# Patient Record
Sex: Male | Born: 1950
Health system: Southern US, Community
[De-identification: ages and names within clinical notes are randomized; demographics above are authoritative.]

## PROBLEM LIST (undated history)

## (undated) DIAGNOSIS — H409 Unspecified glaucoma: Secondary | ICD-10-CM

## (undated) DIAGNOSIS — G4730 Sleep apnea, unspecified: Secondary | ICD-10-CM

## (undated) DIAGNOSIS — I471 Supraventricular tachycardia, unspecified: Secondary | ICD-10-CM

## (undated) DIAGNOSIS — C4491 Basal cell carcinoma of skin, unspecified: Secondary | ICD-10-CM

## (undated) DIAGNOSIS — Z87898 Personal history of other specified conditions: Secondary | ICD-10-CM

## (undated) DIAGNOSIS — K5900 Constipation, unspecified: Secondary | ICD-10-CM

## (undated) DIAGNOSIS — Z9289 Personal history of other medical treatment: Secondary | ICD-10-CM

## (undated) DIAGNOSIS — T7840XA Allergy, unspecified, initial encounter: Secondary | ICD-10-CM

## (undated) DIAGNOSIS — R569 Unspecified convulsions: Secondary | ICD-10-CM

## (undated) DIAGNOSIS — C61 Malignant neoplasm of prostate: Secondary | ICD-10-CM

## (undated) DIAGNOSIS — E785 Hyperlipidemia, unspecified: Secondary | ICD-10-CM

## (undated) DIAGNOSIS — R251 Tremor, unspecified: Secondary | ICD-10-CM

## (undated) HISTORY — DX: Supraventricular tachycardia: I47.1

## (undated) HISTORY — PX: CARDIAC CATHETERIZATION: SHX172

## (undated) HISTORY — DX: Personal history of other specified conditions: Z87.898

## (undated) HISTORY — PX: CATARACT EXTRACTION, BILATERAL: SHX1313

## (undated) HISTORY — DX: Allergy, unspecified, initial encounter: T78.40XA

## (undated) HISTORY — DX: Hyperlipidemia, unspecified: E78.5

## (undated) HISTORY — DX: Supraventricular tachycardia, unspecified: I47.10

## (undated) HISTORY — DX: Unspecified convulsions: R56.9

## (undated) HISTORY — DX: Basal cell carcinoma of skin, unspecified: C44.91

## (undated) HISTORY — DX: Sleep apnea, unspecified: G47.30

## (undated) HISTORY — PX: COLONOSCOPY: SHX174

## (undated) HISTORY — DX: Constipation, unspecified: K59.00

## (undated) HISTORY — PX: CORONARY ANGIOPLASTY: SHX604

---

## 2006-09-26 ENCOUNTER — Encounter: Admission: RE | Admit: 2006-09-26 | Discharge: 2006-12-07 | Payer: Self-pay | Admitting: Sports Medicine

## 2006-10-19 ENCOUNTER — Encounter: Admission: RE | Admit: 2006-10-19 | Discharge: 2006-10-19 | Payer: Self-pay | Admitting: Neurosurgery

## 2008-02-09 ENCOUNTER — Encounter: Admission: RE | Admit: 2008-02-09 | Discharge: 2008-02-09 | Payer: Self-pay | Admitting: Neurosurgery

## 2009-01-22 ENCOUNTER — Encounter: Admission: RE | Admit: 2009-01-22 | Discharge: 2009-01-22 | Payer: Self-pay | Admitting: Neurosurgery

## 2009-06-02 ENCOUNTER — Encounter (INDEPENDENT_AMBULATORY_CARE_PROVIDER_SITE_OTHER): Payer: Self-pay | Admitting: *Deleted

## 2009-06-04 ENCOUNTER — Ambulatory Visit: Payer: Self-pay | Admitting: Gastroenterology

## 2009-06-14 ENCOUNTER — Ambulatory Visit: Payer: Self-pay | Admitting: Gastroenterology

## 2009-11-24 DIAGNOSIS — E782 Mixed hyperlipidemia: Secondary | ICD-10-CM

## 2009-11-24 HISTORY — DX: Mixed hyperlipidemia: E78.2

## 2010-06-28 NOTE — Procedures (Signed)
Summary: Colonoscopy  Patient: [name] Note: All result statuses are Final unless otherwise noted.  Tests: (1) Colonoscopy (COL)   COL Colonoscopy           DONE     [facility]     [address]     [place], [place]  [address]           COLONOSCOPY PROCEDURE REPORT           PATIENT:  Hunter Vega, Hunter Vega  MR#:  [id number]     BIRTHDATE:  [birth year], 58 yrs. old  GENDER:  male           ENDOSCOPIST:  Rachael Fee, MD     Referred by:  Tracey Harries, M.D.           PROCEDURE DATE:  06/14/2009     PROCEDURE:  Colonoscopy, Diagnostic     ASA CLASS:  Class II     INDICATIONS:  Routine Risk Screening           MEDICATIONS:   Fentanyl 50 mcg IV, Versed 6 mg IV           DESCRIPTION OF PROCEDURE:   After the risks benefits and     alternatives of the procedure were thoroughly explained, informed     consent was obtained.  Digital rectal exam was performed and     revealed no rectal masses.   The LB CF-[id number] endoscope     was introduced through the anus and advanced to the cecum, which     was identified by both the appendix and ileocecal valve, without     limitations.  The quality of the prep was excellent, using     MoviPrep.  The instrument was then slowly withdrawn as the colon     was fully examined.     <<PROCEDUREIMAGES>>           FINDINGS:  External hemorrhoids were found. These were small and     not thrombosed.  Mild diverticulosis was found in the sigmoid     colon (see image4).  This was otherwise a normal examination of     the colon (see image2, image3, and image5).   Retroflexed views in     the rectum revealed no abnormalities.    The scope was then     withdrawn from the patient and the procedure completed.           COMPLICATIONS:  None           ENDOSCOPIC IMPRESSION:     1) External hemorrhoids, small and not thrombosed     2) Mild diverticulosis in the sigmoid colon     3) Otherwise normal examination            RECOMMENDATIONS:     1) Continue current colorectal screening recommendations for     "routine risk" patients with a repeat colonoscopy in 10 years.           REPEAT EXAM:  10 years           ______________________________     Rachael Fee, MD           n.     eSIGNED:   Rachael Fee at 06/14/2009 11:47 AM           Gregor Hams, [id number]  Note: An exclamation mark (!) indicates a result that was not dispersed into the flowsheet. Document Creation Date: 06/14/2009 11:46 AM  _______________________________________________________________________  (1) Order result status: Final Collection or observation date-time: 06/14/2009 11:40 Requested date-time:  Receipt date-time:  Reported date-time:  Referring Physician:   Ordering Physician: Rob Bunting [id number]) Specimen Source:  Source: Launa Grill Order Number: [id number] Lab site:   Appended Document: Colonoscopy    Clinical Lists Changes  Observations: Added new observation of COLONNXTDUE: 05/2019 (06/14/2009 15:25)

## 2010-06-28 NOTE — Miscellaneous (Signed)
Summary: [facility]  Clinical Lists Changes  Medications: Added new medication of MOVIPREP 100 GM  SOLR (PEG-KCL-NACL-NASULF-NA ASC-C) As per prep instructions. - Signed Rx of MOVIPREP 100 GM  SOLR (PEG-KCL-NACL-NASULF-NA ASC-C) As per prep instructions.;  #1 x 0;  Signed;  Entered by: Ezra Sites RN;  Authorized by: Rachael Fee MD;  Method used: Electronically to [facility] [phone] [place], [place], [place]  [address], Ph: [phone], Fax: [phone] Allergies: Added new allergy or adverse reaction of PCN Observations: Added new observation of NKA: F (06/04/2009 10:23)    Prescriptions: MOVIPREP 100 GM  SOLR (PEG-KCL-NACL-NASULF-NA ASC-C) As per prep instructions.  #1 x 0   Entered by:   Ezra Sites RN   Authorized by:   Rachael Fee MD   Signed by:   Ezra Sites RN on 06/04/2009   Method used:   Electronically to        [facility] [address] (retail)       [phone]       [place], [place]  [address]       Ph: [phone]       Fax: [phone]   RxID:   [id number]

## 2010-06-28 NOTE — Letter (Signed)
Summary: [facility] Instructions  [facility]  [address] [place], [place] [address]   Phone: [phone]  Fax: [phone]       ALTO GANDOLFO    [birth year]    MRN: [id number]        Procedure Day /Date: Monday 06/14/2009     Arrival Time:  10:30am     Procedure Time:  11:30am     Location of Procedure:                    _ X_  [facility] (4th Floor)                        PREPARATION FOR COLONOSCOPY WITH MOVIPREP   Starting 5 days prior to your procedure  Wednesday 1/12 do not eat nuts, seeds, popcorn, corn, beans, peas,  salads, or any raw vegetables.  Do not take any fiber supplements (e.g. Metamucil, Citrucel, and Benefiber).  THE DAY BEFORE YOUR PROCEDURE         DATE: Sunday 1/16  1.  Drink clear liquids the entire day-NO SOLID FOOD  2.  Do not drink anything colored red or purple.  Avoid juices with pulp.  No orange juice.  3.  Drink at least 64 oz. (8 glasses) of fluid/clear liquids during the day to prevent dehydration and help the prep work efficiently.  CLEAR LIQUIDS INCLUDE: Water Jello Ice Popsicles Tea (sugar ok, no milk/cream) Powdered fruit flavored drinks Coffee (sugar ok, no milk/cream) Gatorade Juice: apple, white grape, white cranberry  Lemonade Clear bullion, consomm, broth Carbonated beverages (any kind) Strained chicken noodle soup Hard Candy                             4.  In the morning, mix first dose of MoviPrep solution:    Empty 1 Pouch A and 1 Pouch B into the disposable container    Add lukewarm drinking water to the top line of the container. Mix to dissolve    Refrigerate (mixed solution should be used within 24 hrs)  5.  Begin drinking the prep at 5:00 p.m. The MoviPrep container is divided by 4 marks.   Every 15 minutes drink the solution down to the next mark (approximately 8 oz) until the full liter is complete.   6.  Follow completed prep with 16 oz of clear liquid of your choice  (Nothing red or purple).  Continue to drink clear liquids until bedtime.  7.  Before going to bed, mix second dose of MoviPrep solution:    Empty 1 Pouch A and 1 Pouch B into the disposable container    Add lukewarm drinking water to the top line of the container. Mix to dissolve    Refrigerate  THE DAY OF YOUR PROCEDURE      DATE: Monday 1/17  Beginning at 6:30 am (5 hours before procedure):         1. Every 15 minutes, drink the solution down to the next mark (approx 8 oz) until the full liter is complete.  2. Follow completed prep with 16 oz. of clear liquid of your choice.    3. You may drink clear liquids until 9:30 am  (2 HOURS BEFORE PROCEDURE).   MEDICATION INSTRUCTIONS  Unless otherwise instructed, you should take regular prescription medications with a small sip of water   as early as possible the morning of  your procedure.        OTHER INSTRUCTIONS  You will need a responsible adult at least 62 years of age to accompany you and drive you home.   This person must remain in the waiting room during your procedure.  Wear loose fitting clothing that is easily removed.  Leave jewelry and other valuables at home.  However, you may wish to bring a book to read or  an iPod/MP3 player to listen to music as you wait for your procedure to start.  Remove all body piercing jewelry and leave at home.  Total time from sign-in until discharge is approximately 2-3 hours.  You should go home directly after your procedure and rest.  You can resume normal activities the  day after your procedure.  The day of your procedure you should not:   Drive   Make legal decisions   Operate machinery   Drink alcohol   Return to work  You will receive specific instructions about eating, activities and medications before you leave.    The above instructions have been reviewed and explained to me by   Ezra Sites RN  June 04, 2009 10:49 AM     I fully understand and can  verbalize these instructions _____________________________ Date _________

## 2011-01-02 ENCOUNTER — Emergency Department (HOSPITAL_COMMUNITY): Payer: Managed Care, Other (non HMO)

## 2011-01-02 ENCOUNTER — Emergency Department (HOSPITAL_COMMUNITY)
Admission: EM | Admit: 2011-01-02 | Discharge: 2011-01-02 | Disposition: A | Discharge status: Home / Self Care | Payer: Managed Care, Other (non HMO) | Attending: Emergency Medicine | Admitting: Emergency Medicine

## 2011-01-02 DIAGNOSIS — Z79899 Other long term (current) drug therapy: Secondary | ICD-10-CM | POA: Insufficient documentation

## 2011-01-02 DIAGNOSIS — E785 Hyperlipidemia, unspecified: Secondary | ICD-10-CM | POA: Insufficient documentation

## 2011-01-02 DIAGNOSIS — S01502A Unspecified open wound of oral cavity, initial encounter: Secondary | ICD-10-CM | POA: Insufficient documentation

## 2011-01-02 DIAGNOSIS — W503XXA Accidental bite by another person, initial encounter: Secondary | ICD-10-CM | POA: Insufficient documentation

## 2011-01-02 DIAGNOSIS — F29 Unspecified psychosis not due to a substance or known physiological condition: Secondary | ICD-10-CM | POA: Insufficient documentation

## 2011-01-02 DIAGNOSIS — R569 Unspecified convulsions: Secondary | ICD-10-CM | POA: Insufficient documentation

## 2011-01-02 LAB — COMPREHENSIVE METABOLIC PANEL
ALT: 11 U/L (ref 0–53)
AST: 16 U/L (ref 0–37)
Albumin: 3.2 g/dL — ABNORMAL LOW (ref 3.5–5.2)
Alkaline Phosphatase: 68 U/L (ref 39–117)
BUN: 18 mg/dL (ref 6–23)
CO2: 22 mEq/L (ref 19–32)
Calcium: 9 mg/dL (ref 8.4–10.5)
Chloride: 108 mEq/L (ref 96–112)
Creatinine, Ser: 0.84 mg/dL (ref 0.50–1.35)
GFR calc Af Amer: >60 mL/min (ref >60)
GFR calc non Af Amer: >60 mL/min (ref >60)
Glucose, Bld: 88 mg/dL (ref 70–99)
Potassium: 3.4 mEq/L — ABNORMAL LOW (ref 3.5–5.1)
Sodium: 139 mEq/L (ref 135–145)
Total Bilirubin: 0.3 mg/dL (ref 0.3–1.2)
Total Protein: 6.1 g/dL (ref 6.0–8.3)

## 2011-01-02 LAB — URINALYSIS, ROUTINE W REFLEX MICROSCOPIC
Bilirubin Urine: NEGATIVE
Glucose, UA: NEGATIVE mg/dL
Ketones, ur: NEGATIVE mg/dL
Leukocytes, UA: NEGATIVE
Nitrite: NEGATIVE
Protein, ur: NEGATIVE mg/dL
Specific Gravity, Urine: 1.017 (ref 1.005–1.030)
Urobilinogen, UA: 0.2 mg/dL (ref 0.0–1.0)
pH: 6 (ref 5.0–8.0)

## 2011-01-02 LAB — MAGNESIUM: Magnesium: 2.1 mg/dL (ref 1.5–2.5)

## 2011-01-02 LAB — DIFFERENTIAL
Basophils Absolute: 0.1 10*3/uL (ref 0.0–0.1)
Basophils Relative: 1 % (ref 0–1)
Eosinophils Absolute: 0.5 10*3/uL (ref 0.0–0.7)
Eosinophils Relative: 7 % — ABNORMAL HIGH (ref 0–5)
Lymphocytes Relative: 33 % (ref 12–46)
Lymphs Abs: 2.2 10*3/uL (ref 0.7–4.0)
Monocytes Absolute: 0.7 10*3/uL (ref 0.1–1.0)
Monocytes Relative: 10 % (ref 3–12)
Neutro Abs: 3.4 10*3/uL (ref 1.7–7.7)
Neutrophils Relative %: 50 % (ref 43–77)

## 2011-01-02 LAB — CBC
HCT: 37.9 % — ABNORMAL LOW (ref 39.0–52.0)
Hemoglobin: 13.2 g/dL (ref 13.0–17.0)
MCH: 31.3 pg (ref 26.0–34.0)
MCHC: 34.8 g/dL (ref 30.0–36.0)
MCV: 89.8 fL (ref 78.0–100.0)
Platelets: 162 10*3/uL (ref 150–400)
RBC: 4.22 MIL/uL (ref 4.22–5.81)
RDW: 12.9 % (ref 11.5–15.5)
WBC: 6.9 10*3/uL (ref 4.0–10.5)

## 2011-01-02 LAB — URINE MICROSCOPIC-ADD ON

## 2011-03-30 DIAGNOSIS — G4739 Other sleep apnea: Secondary | ICD-10-CM

## 2011-03-30 HISTORY — DX: Other sleep apnea: G47.39

## 2011-05-10 DIAGNOSIS — G40909 Epilepsy, unspecified, not intractable, without status epilepticus: Secondary | ICD-10-CM

## 2011-05-10 HISTORY — DX: Epilepsy, unspecified, not intractable, without status epilepticus: G40.909

## 2013-05-29 HISTORY — PX: CORONARY STENT PLACEMENT: SHX1402

## 2013-12-18 DIAGNOSIS — Z0000 Encounter for general adult medical examination without abnormal findings: Secondary | ICD-10-CM | POA: Insufficient documentation

## 2013-12-18 DIAGNOSIS — G40209 Localization-related (focal) (partial) symptomatic epilepsy and epileptic syndromes with complex partial seizures, not intractable, without status epilepticus: Secondary | ICD-10-CM

## 2013-12-18 DIAGNOSIS — Z79899 Other long term (current) drug therapy: Secondary | ICD-10-CM

## 2013-12-18 HISTORY — DX: Localization-related (focal) (partial) symptomatic epilepsy and epileptic syndromes with complex partial seizures, not intractable, without status epilepticus: G40.209

## 2013-12-18 HISTORY — DX: Encounter for general adult medical examination without abnormal findings: Z00.00

## 2013-12-18 HISTORY — DX: Other long term (current) drug therapy: Z79.899

## 2014-12-02 ENCOUNTER — Encounter: Payer: Self-pay | Admitting: Gastroenterology

## 2015-11-04 DIAGNOSIS — I2510 Atherosclerotic heart disease of native coronary artery without angina pectoris: Secondary | ICD-10-CM

## 2015-11-04 DIAGNOSIS — D3610 Benign neoplasm of peripheral nerves and autonomic nervous system, unspecified: Secondary | ICD-10-CM | POA: Insufficient documentation

## 2015-11-04 DIAGNOSIS — I471 Supraventricular tachycardia: Secondary | ICD-10-CM | POA: Insufficient documentation

## 2015-11-04 HISTORY — DX: Benign neoplasm of peripheral nerves and autonomic nervous system, unspecified: D36.10

## 2015-11-04 HISTORY — DX: Atherosclerotic heart disease of native coronary artery without angina pectoris: I25.10

## 2015-11-11 DIAGNOSIS — L84 Corns and callosities: Secondary | ICD-10-CM | POA: Diagnosis not present

## 2015-11-11 DIAGNOSIS — M216X1 Other acquired deformities of right foot: Secondary | ICD-10-CM | POA: Diagnosis not present

## 2015-11-11 DIAGNOSIS — M79671 Pain in right foot: Secondary | ICD-10-CM | POA: Diagnosis not present

## 2016-06-12 DIAGNOSIS — I2510 Atherosclerotic heart disease of native coronary artery without angina pectoris: Secondary | ICD-10-CM | POA: Diagnosis not present

## 2016-06-12 DIAGNOSIS — Z6824 Body mass index (BMI) 24.0-24.9, adult: Secondary | ICD-10-CM | POA: Diagnosis not present

## 2016-06-12 DIAGNOSIS — G40909 Epilepsy, unspecified, not intractable, without status epilepticus: Secondary | ICD-10-CM | POA: Diagnosis not present

## 2016-06-12 DIAGNOSIS — E785 Hyperlipidemia, unspecified: Secondary | ICD-10-CM | POA: Diagnosis not present

## 2016-06-12 DIAGNOSIS — I471 Supraventricular tachycardia: Secondary | ICD-10-CM | POA: Diagnosis not present

## 2016-07-24 DIAGNOSIS — H16223 Keratoconjunctivitis sicca, not specified as Sjogren's, bilateral: Secondary | ICD-10-CM | POA: Diagnosis not present

## 2016-07-24 DIAGNOSIS — H2513 Age-related nuclear cataract, bilateral: Secondary | ICD-10-CM | POA: Diagnosis not present

## 2016-07-25 ENCOUNTER — Encounter: Payer: Self-pay | Admitting: Sports Medicine

## 2016-07-25 ENCOUNTER — Ambulatory Visit (INDEPENDENT_AMBULATORY_CARE_PROVIDER_SITE_OTHER): Payer: PPO | Admitting: Sports Medicine

## 2016-07-25 ENCOUNTER — Ambulatory Visit
Admission: RE | Admit: 2016-07-25 | Discharge: 2016-07-25 | Disposition: A | Discharge status: Home / Self Care | Payer: PPO | Source: Ambulatory Visit | Attending: Sports Medicine | Admitting: Sports Medicine

## 2016-07-25 ENCOUNTER — Other Ambulatory Visit: Payer: Self-pay | Admitting: Sports Medicine

## 2016-07-25 VITALS — BP 134/58 | HR 71 | Ht 68.0 in | Wt 160.0 lb

## 2016-07-25 DIAGNOSIS — M25562 Pain in left knee: Secondary | ICD-10-CM

## 2016-07-25 NOTE — Progress Notes (Signed)
  Hunter Vega - 66 y.o. male MRN [id number]  Date of birth: [birth year]  SUBJECTIVE:   CC: Left Knee Pain  HPI: Patient states he started getting left knee pain about one month ago. He associates the pain to playing racquetball. He plays weekly on Monday Wednesday and Friday. States that he woke up one morning a month ago with sore knee. Denies any injuries or trauma to the knee. Has never had issues with knee before. There is some associated swelling after playing racquetball. Sometimes has a feeling that his knee is locking up. Ice helps decrease swelling. Localizes pain to the medial aspect of knee. Endorses trouble going down steps.   ROS per HPI. Denies erythema, warmth, fevers   HISTORY: Past Medical, Surgical, Social, and Family History Reviewed & Updated per EMR.   Pertinent Historical Findings include: History of seizure disorder  PHYSICAL EXAM:  BP (!) 134/58   Pulse 71   Ht 5\' 8"  (1.727 m)   Wt 160 lb (72.6 kg)   BMI 24.33 kg/m   General: alert, well-developed, NAD, cooperative.  Left Knee: Normal to inspection with no erythema or effusion or obvious bony abnormalities. Palpation with no warmth, medial joint line tenderness, no patellar tenderness, or condyle tenderness. ROM full in all directions Ligaments with solid consistent endpoints including ACL, PCL, LCL, MCL. Positive thessaly maneuver  Tone and strength normal Patellar glide with crepitus Deep tendon reflexes symmetrical and normal Varus deformity of left leg  ASSESSMENT & PLAN:   A: Acute left knee pain most consistent with either DJD vs degenerative meniscal tear.   P: Shared decision making was discussed. Patient would like to hold off on any cortisone injections or medications at this time. He would like to try compression sleeve only. Continue icing as needed. Will obtain imaging of left knee Follow up in 4 weeks   Luiz Blare, DO 07/25/2016, 10:59 AM PGY-3, [facility]  Patient seen and evaluated with the resident. I agree with the above plan of care. Patient's x-rays of his left knee show minimal degenerative changes. He is likely dealing with a degenerative medial meniscal tear. His main complaint is swelling after playing racquetball. Some discomfort the following day as well but overall his symptoms are fairly tolerable. We discussed treatment options including oral anti-inflammatories, cortisone injections, and knee strengthening exercises. He would like to start with a compression sleeve and knee strengthening exercises. He will follow-up with me in 4 weeks. If symptoms persist or worsen then we will reconsider starting a short course of oral anti-inflammatories or possibly a cortisone injection. Ultimately if symptoms do not improve he will need further diagnostic imaging.

## 2016-08-22 ENCOUNTER — Ambulatory Visit (INDEPENDENT_AMBULATORY_CARE_PROVIDER_SITE_OTHER): Payer: PPO | Admitting: Sports Medicine

## 2016-08-22 ENCOUNTER — Encounter: Payer: Self-pay | Admitting: Sports Medicine

## 2016-08-22 VITALS — BP 145/70 | HR 68 | Ht 68.0 in | Wt 160.0 lb

## 2016-08-22 DIAGNOSIS — M25562 Pain in left knee: Secondary | ICD-10-CM

## 2016-08-23 NOTE — Progress Notes (Signed)
   Subjective:    Patient ID: Hunter Vega, male    DOB: 08-30-1950, 66 y.o.   MRN: [id number]  HPI   Patient comes in today for follow-up on left knee pain and swelling. Overall, he feels like his symptoms have slightly improved. He is still able to play racquetball without any pain. The majority of his symptoms come when squatting or getting up from a squatted position. He reports occasional popping around the left patella when getting up from a squatted position but he does not think that it is painful. His swelling has improved some. He localizes his pain around the patella as well as the medial aspect of his knee but he has begun to develop some new pain in the distal quadriceps tendon as well. He has been doing some leg extensions at the gym to try to strengthen his quads. He denies any locking or catching in his knee. No feelings of instability. He thinks the body helix compression sleeve has been helpful.     Review of Systems As above     Objective:   Physical Exam  Well-developed, fit appearing. No acute distress. Awake alert and oriented 3. Vital signs reviewed   left knee: Full range of motion. No obvious effusion. 2+ patellofemoral crepitus with active extension. No pain with patellar compression. He is slightly tender to palpation along the lateral aspect of the distal quadriceps tendon just proximal to the patella and he has reproducible pain with resisted knee extension. There is no soft tissue swelling in this area. He is tender to palpation along the medial joint line with a positive Thessaly's. Knee remains stable to ligamentous exam. Neurovascularly intact distally.  X-rays of the left knee show minimal degenerative changes with a small effusion  Ultrasound evaluation today of the left knee was performed. Patient has a small knee effusion. There is also some just anterior to the distal quadriceps tendon. I do not appreciate any tearing of the tendon but there is a small  patellar spur in this area. Visualized portion of the medial meniscus is unremarkable.       Assessment & Plan:    left knee pain and swelling  Patient's joint effusion suggest an intra-articular process. His x-ray show a possibility of degenerative changes. I think he is likely dealing with a degenerative meniscal tear even though the ultrasound does not definitively show this. I also think he is getting a strain on his quadriceps tendon from doing his leg extensions at the gym. He will discontinue those and will instead start isometric quad exercises and half squats. He will do these daily. He will continue with his body helix compression sleeve when active and follow-up with me again in 4 weeks. I discussed the possibility of an intra-articular cortisone injection at follow-up if symptoms persist.

## 2016-09-19 ENCOUNTER — Ambulatory Visit: Payer: PPO | Admitting: Sports Medicine

## 2016-10-18 DIAGNOSIS — Z6824 Body mass index (BMI) 24.0-24.9, adult: Secondary | ICD-10-CM | POA: Diagnosis not present

## 2016-10-18 DIAGNOSIS — G40309 Generalized idiopathic epilepsy and epileptic syndromes, not intractable, without status epilepticus: Secondary | ICD-10-CM | POA: Diagnosis not present

## 2016-10-18 DIAGNOSIS — G40209 Localization-related (focal) (partial) symptomatic epilepsy and epileptic syndromes with complex partial seizures, not intractable, without status epilepticus: Secondary | ICD-10-CM | POA: Diagnosis not present

## 2016-10-18 DIAGNOSIS — G2581 Restless legs syndrome: Secondary | ICD-10-CM | POA: Diagnosis not present

## 2016-10-18 DIAGNOSIS — E785 Hyperlipidemia, unspecified: Secondary | ICD-10-CM | POA: Diagnosis not present

## 2016-11-10 ENCOUNTER — Telehealth: Payer: Self-pay | Admitting: *Deleted

## 2016-11-10 NOTE — Telephone Encounter (Signed)
Received Medical records from [facility], but pt is Non-Established; scheduled to [place] with Percell Miller on 02/08/2017 at 3:00pm, forwarded to provider/SLS 06/15

## 2016-11-20 ENCOUNTER — Encounter: Payer: Self-pay | Admitting: Family Medicine

## 2016-11-20 ENCOUNTER — Ambulatory Visit (INDEPENDENT_AMBULATORY_CARE_PROVIDER_SITE_OTHER): Payer: PPO | Admitting: Family Medicine

## 2016-11-20 VITALS — BP 143/77 | HR 74 | Ht 68.0 in | Wt 160.0 lb

## 2016-11-20 DIAGNOSIS — R200 Anesthesia of skin: Secondary | ICD-10-CM

## 2016-11-20 DIAGNOSIS — M25562 Pain in left knee: Secondary | ICD-10-CM

## 2016-11-20 MED ORDER — PREDNISONE 10 MG PO TABS: ORAL_TABLET | ORAL | 0 refills | Status: DC

## 2016-11-20 NOTE — Patient Instructions (Signed)
Your numbness and weakness are either due to a peripheral neuropathy or radiculopathy (irritated nerve in your back). Take the prednisone dose pack as directed for 6 days. Don't take aleve or ibuprofen with the prednisone. We will set up nerve conduction studies of your left leg to identify where this nerve is being pinched/irritated. Start physical therapy to focus on regaining strength and endurance of your leg. Follow up with me in 1 month for reevaluation though I will call you following the nerve study (you can cancel this if you're improved though).

## 2016-11-22 DIAGNOSIS — R200 Anesthesia of skin: Secondary | ICD-10-CM

## 2016-11-22 HISTORY — DX: Anesthesia of skin: R20.0

## 2016-11-22 NOTE — Progress Notes (Signed)
PCP: Chesley Mires, MD  Subjective:   HPI: Patient is a 66 y.o. male here for left knee numbness.  3/27: Patient comes in today for follow-up on left knee pain and swelling. Overall, he feels like his symptoms have slightly improved. He is still able to play racquetball without any pain. The majority of his symptoms come when squatting or getting up from a squatted position. He reports occasional popping around the left patella when getting up from a squatted position but he does not think that it is painful. His swelling has improved some. He localizes his pain around the patella as well as the medial aspect of his knee but he has begun to develop some new pain in the distal quadriceps tendon as well. He has been doing some leg extensions at the gym to try to strengthen his quads. He denies any locking or catching in his knee. No feelings of instability. He thinks the body helix compression sleeve has been helpful.  6/25: Patient reports he's had more than 2 months of numbness medial aspect of lower thigh through knee and lower leg. He was seen previously for problems with low back and did physical therapy but this improved - no pain now. He gets pain up to 5/10 in this left knee medially with walking, dull. Some swelling. No new injuries. Knee feels weak though like it wants to buckle. No skin changes.  No past medical history on file.  Current Outpatient Prescriptions on File Prior to Visit  Medication Sig Dispense Refill  . aspirin EC 81 MG tablet Take by mouth.    Marland Kitchen atorvastatin (LIPITOR) 80 MG tablet Take 80 mg by mouth.    . lamoTRIgine (LAMICTAL) 150 MG tablet Take 150 mg by mouth.     No current facility-administered medications on file prior to visit.     No past surgical history on file.  Allergies  Allergen Reactions  . Penicillins     REACTION: swelling    Social History   Social History  . Marital status: Married    Spouse name: N/A  . Number of children:  N/A  . Years of education: N/A   Occupational History  . Not on file.   Social History Main Topics  . Smoking status: Never Smoker  . Smokeless tobacco: Never Used  . Alcohol use Not on file  . Drug use: Unknown  . Sexual activity: Not on file   Other Topics Concern  . Not on file   Social History Narrative  . No narrative on file    No family history on file.  BP (!) 143/77   Pulse 74   Ht 5\' 8"  (1.727 m)   Wt 160 lb (72.6 kg)   BMI 24.33 kg/m   Review of Systems: See HPI above.     Objective:  Physical Exam:  Gen: NAD, comfortable in exam room  Left knee: No gross deformity, ecchymoses, swelling. No TTP. FROM.  Strength 5/5 with knee flexion, extension, hip flexion, knee external rotation. Negative ant/post drawers. Negative valgus/varus testing. Negative lachmanns. Negative mcmurrays, apleys, patellar apprehension. Sensation diminished from mid-thigh medially to mid-lower leg medially.  Rest of sensation intact.  Right knee: FROM without pain.   Assessment & Plan:  1. Left leg numbness - with subjective weakness as well.  Knee exam is normal, benign.  Distribution does not fit with saphenous neuropathy.  He has had back pain but this has improved with PT.  Advised we go ahead with NCV/EMGs  as next step.  Trial prednisone as well for nerve irritation.  Start physical therapy for the leg to work on endurance, strengthening.  F/u in 1 month.

## 2016-11-22 NOTE — Assessment & Plan Note (Signed)
with subjective weakness as well.  Knee exam is normal, benign.  Distribution does not fit with saphenous neuropathy.  He has had back pain but this has improved with PT.  Advised we go ahead with NCV/EMGs as next step.  Trial prednisone as well for nerve irritation.  Start physical therapy for the leg to work on endurance, strengthening.  F/u in 1 month.

## 2016-11-23 ENCOUNTER — Ambulatory Visit: Payer: PPO | Attending: Family Medicine | Admitting: Physical Therapy

## 2016-11-23 DIAGNOSIS — M25562 Pain in left knee: Secondary | ICD-10-CM | POA: Diagnosis not present

## 2016-11-23 DIAGNOSIS — R29898 Other symptoms and signs involving the musculoskeletal system: Secondary | ICD-10-CM

## 2016-11-23 DIAGNOSIS — M545 Low back pain: Secondary | ICD-10-CM

## 2016-11-23 NOTE — Therapy (Signed)
[facility] [address]  [place], [place], [address] Phone: [phone]   Fax:  [phone]  Physical Therapy Evaluation  Patient Details  Name: Hunter Vega MRN: [id number] Date of Birth: [birth year] Referring Provider: Dr. Karlton Lemon  Encounter Date: 11/23/2016      PT End of Session - 11/23/16 1706    Visit Number 1   Number of Visits 12   Date for PT Re-Evaluation 01/04/17   Authorization Type Medicare   PT Start Time 5329   PT Stop Time 1613   PT Time Calculation (min) 40 min   Activity Tolerance Patient tolerated treatment well   Behavior During Therapy [facility] for tasks assessed/performed      No past medical history on file.  No past surgical history on file.  There were no vitals filed for this visit.       Subjective Assessment - 11/23/16 1533    Subjective Patient reporting L knee pain - saw sports med MD. Reports numbness of medial L knee - had a "back problem" 3-4 weeks ago after picking strawberries. Went to PT for back pain, had some dry needling with pain relief. Still having some medial leg numbness. Reports arthritis of L knee as well. Reports pain in quad, as well as well general weakness.    Pertinent History cardiac stents, hx of seizure (last 4 years ago)   Diagnostic tests Xray   Patient Stated Goals improve weakness and numbness   Currently in Pain? No/denies   Pain Score 0-No pain            [facility] PT Assessment - 11/23/16 1538      Assessment   Medical Diagnosis Arthralgia of L knee   Referring Provider Dr. Karlton Lemon   Onset Date/Surgical Date --  3-4 weeks   Next MD Visit 12/22/16   Prior Therapy yes     Precautions   Precautions None     Restrictions   Weight Bearing Restrictions No     Balance Screen   Has the patient fallen in the past 6 months Yes   How many times? 1   Has the patient had a decrease in activity level because of a fear of falling?  No    Is the patient reluctant to leave their home because of a fear of falling?  No     Home Environment   Living Environment Private residence   Type of [place]     Prior Function   Level of [place] Retired   Leisure grandchildren, plans to go to [place] in August     Cognition   Overall Cognitive Status Within Functional Limits for tasks assessed     Observation/Other Assessments   Focus on Therapeutic Outcomes (FOTO)  Knee: 53 (47% limited, predicted 35% limited)     Sensation   Light Touch Impaired Detail   Additional Comments patient reporitng numbness of L medial knee     Coordination   Gross Motor Movements are Fluid and Coordinated Yes     Posture/Postural Control   Posture/Postural Control Postural limitations   Postural Limitations Rounded Shoulders;Forward head     ROM / Strength   AROM / PROM / Strength AROM;Strength     AROM   Overall AROM  Within functional limits for tasks performed   Overall AROM Comments B LE     Strength   Strength Assessment Site Hip;Knee;Ankle   Right/Left Hip  Right;Left   Right Hip Flexion 5/5   Right Hip Extension 4-/5   Right Hip ABduction 5/5   Right Hip ADduction 5/5   Left Hip Flexion 4-/5   Left Hip Extension 3+/5   Left Hip ABduction 4/5   Left Hip ADduction 4/5   Right/Left Knee Right;Left   Right Knee Flexion 5/5   Right Knee Extension 5/5   Left Knee Flexion 5/5   Left Knee Extension 5/5   Right/Left Ankle Right;Left   Right Ankle Dorsiflexion 4+/5   Left Ankle Dorsiflexion 4+/5     Flexibility   Soft Tissue Assessment /Muscle Length yes  L adductor tightness   Hamstrings B moderate tightness     Palpation   Palpation comment point tenderness to medial/inferior L knee            Objective measurements completed on examination: See above findings.          [facility] Adult PT Treatment/Exercise - 11/23/16 1538      Exercises   Exercises Knee/Hip     Knee/Hip  Exercises: Stretches   Passive Hamstring Stretch Left;3 reps;30 seconds   Passive Hamstring Stretch Limitations supine with strap   Other Knee/Hip Stretches L adductor stretch  - 3 x 30 seconds     Knee/Hip Exercises: Supine   Bridges Both;10 reps   Single Leg Bridge Left;10 reps   Straight Leg Raises Left;10 reps   Straight Leg Raise with External Rotation Left;10 reps     Knee/Hip Exercises: Prone   Hip Extension Right;Left;10 reps                PT Education - 11/23/16 1706    Education provided Yes   Education Details exam findings, POC, HEP   Person(s) Educated Patient   Methods Explanation;Demonstration;Handout   Comprehension Verbalized understanding;Returned demonstration;Need further instruction          PT Short Term Goals - 11/23/16 1719      PT SHORT TERM GOAL #1   Title patient to be independent with initial HEP (12/07/16)   Status New           PT Long Term Goals - 11/23/16 1719      PT LONG TERM GOAL #1   Title Patient to be independent wiht advanced HEP (01/04/17)   Status New     PT LONG TERM GOAL #2   Title Patient to imprive tissue quality as demonstrated by improved flexibility - both HS and adductors (01/04/17)   Status New     PT LONG TERM GOAL #3   Title Patient to report improved sensations of numbness at L knee (01/04/17)   Status New     PT LONG TERM GOAL #4   Title Patient to report ability to perform daily activities and leisure tasks without limitations (01/04/17)   Status New                Plan - 11/23/16 1708    Clinical Impression Statement Patient is a 66 y/o male presenting to [facility] today for primary complaints of L knee pain, numbness, and general L LE weakness. Patient with known injury to low back approx. 4 weeks ago with prolonged forward flexion picking strawberries - onset of back pain along with numbness into L LE. Patient seen by PT for back pain with near resolution of symtpoms, however L LE weakness,  numbness and knee pain continues to be an issue. Patient today with moderately tight HS (L>R), some L  LE weakness as compared to R LE as well as point tenderness to inferior/medial L knee. Patient to benefit from PT to address the above listed deficits to allow for improved functional mobility and general improved quality of life.    Clinical Presentation Stable   Clinical Presentation due to: no co-morbiditis impacting POC, stable medical condition   Clinical Decision Making Low   Rehab Potential Good   PT Frequency 2x / week   PT Duration 6 weeks   PT Treatment/Interventions ADLs/Self Care Home Management;Cryotherapy;Electrical Stimulation;Iontophoresis 4mg /ml Dexamethasone;Moist Heat;Ultrasound;Neuromuscular re-education;Balance training;Therapeutic exercise;Therapeutic activities;Functional mobility training;Patient/family education;Manual techniques;Passive range of motion;Vasopneumatic Device;Taping;Dry needling   Consulted and Agree with Plan of Care Patient      Patient will benefit from skilled therapeutic intervention in order to improve the following deficits and impairments:  Decreased activity tolerance, Decreased mobility, Decreased strength, Pain  Visit Diagnosis: Acute pain of left knee - Plan: PT plan of care cert/re-cert  Low back pain, unspecified back pain laterality, unspecified chronicity, with sciatica presence unspecified - Plan: PT plan of care cert/re-cert  Other symptoms and signs involving the musculoskeletal system - Plan: PT plan of care cert/re-cert      G-Codes - 04/59/97 1707    Functional Assessment Tool Used (Outpatient Only) FOTO: 53 (47% limited)   Functional Limitation Mobility: Walking and moving around   Mobility: Walking and Moving Around Current Status ([id number]) At least 40 percent but less than 60 percent impaired, limited or restricted   Mobility: Walking and Moving Around Goal Status [phone]) At least 20 percent but less than 40 percent impaired,  limited or restricted       Problem List Patient Active Problem List   Diagnosis Date Noted  . Left leg numbness 11/22/2016  . PSVT (paroxysmal supraventricular tachycardia) ([place]) 11/04/2015  . CAD (coronary artery disease) 11/04/2015  . Polypharmacy 12/18/2013  . Partial epilepsy with impairment of consciousness ([place]) 12/18/2013  . Encounter for long-term (current) use of medications 12/18/2013  . Seizure disorder ([place]) 05/10/2011  . Mixed sleep apnea 03/30/2011  . Mixed hyperlipidemia 11/24/2009      Lanney Gins, PT, DPT 11/23/16 5:25 PM   [facility] [address]  [place], [place], [address] Phone: [phone]   Fax:  [phone]  Name: Hunter Vega MRN: [id number] Date of Birth: Jun 26, 1950

## 2016-11-23 NOTE — Patient Instructions (Signed)
Hamstring Step 2    Left foot relaxed, knee straight, other leg bent, foot flat. Raise straight leg further upward to maximal range. Hold _30__ seconds. Relax leg completely down. Repeat _3__ times.  Strengthening: Straight Leg Raise (Phase 1)    Tighten muscles on front of right thigh, then lift leg __8__ inches from surface, keeping knee locked.  Repeat __15__ times per set. Do __2__ sets per session.  Straight Leg Raise: With External Leg Rotation    Lie on back with right leg straight, opposite leg bent. Rotate straight leg out and lift __8__ inches. Repeat _15___ times per set. Do __2__ sets per session.   Bridging    Slowly raise buttocks from floor, keeping stomach tight. Repeat _15___ times per set. Do __2__ sets per session.   Bridging (Single Leg)    Lie on back with feet shoulder width apart and right leg straight. Lift hips toward the ceiling while keeping leg straight. Hold __5__ seconds. Repeat __15__ times.   Hip Extension (Prone)    Lift left leg _5___ inches from floor, keeping knee locked. Repeat __15__ times per set.   Hip adductor Stretch    3 x 30 seconds

## 2016-12-01 ENCOUNTER — Ambulatory Visit: Payer: PPO | Attending: Family Medicine

## 2016-12-01 DIAGNOSIS — R29898 Other symptoms and signs involving the musculoskeletal system: Secondary | ICD-10-CM | POA: Diagnosis not present

## 2016-12-01 DIAGNOSIS — M545 Low back pain: Secondary | ICD-10-CM | POA: Diagnosis not present

## 2016-12-01 DIAGNOSIS — M25562 Pain in left knee: Secondary | ICD-10-CM | POA: Diagnosis not present

## 2016-12-01 NOTE — Therapy (Signed)
[facility] [address]  [place], [place], [address] Phone: [phone]   Fax:  [phone]  Physical Therapy Treatment  Patient Details  Name: Hunter Vega MRN: [id number] Date of Birth: 27-Aug-1950 Referring Provider: Dr. Karlton Lemon  Encounter Date: 12/01/2016      PT End of Session - 12/01/16 0807    Visit Number 2   Number of Visits 12   Date for PT Re-Evaluation 01/04/17   Authorization Type Medicare   PT Start Time 0800   PT Stop Time 5374   PT Time Calculation (min) 47 min   Activity Tolerance Patient tolerated treatment well   Behavior During Therapy [facility] for tasks assessed/performed      No past medical history on file.  No past surgical history on file.  There were no vitals filed for this visit.      Subjective Assessment - 12/01/16 0806    Subjective Pt. noting HEP going well.     Patient Stated Goals improve weakness and numbness   Currently in Pain? No/denies   Pain Score 0-No pain   Multiple Pain Sites No                         [facility] Adult PT Treatment/Exercise - 12/01/16 0808      Knee/Hip Exercises: Stretches   Passive Hamstring Stretch Left;30 seconds;2 reps   Passive Hamstring Stretch Limitations supine with strap   Other Knee/Hip Stretches L adductor stretch  - 2 x 30 seconds   Other Knee/Hip Stretches Seated butterfly groin stretch     Knee/Hip Exercises: Aerobic   Nustep NuStep: lvl 5, 6 min      Knee/Hip Exercises: Standing   Hip Flexion Right;Left;15 reps   Hip Flexion Limitations red TB; 1 ski pole    Forward Lunges Right;Left;10 reps;3 seconds   Forward Lunges Limitations holding onto chair   Hip ADduction Right;Left;15 reps   Hip ADduction Limitations red TB; 1 ski pole    Hip Abduction Right;Left;15 reps   Abduction Limitations red TB; 1 ski pole    Hip Extension Right;Left;15 reps   Extension Limitations red TB; 1 ski pole      Knee/Hip  Exercises: Supine   Short Arc Quad Sets Left;15 reps;1 set;Strengthening   Short Arc Quad Sets Limitations 3# with adduction ball squeeze    Bridges Both;20 reps   Other Supine Knee/Hip Exercises LTR 2 x 20 sec                 PT Education - 12/01/16 0906    Education provided Yes   Education Details 4-way SLR with red TB issued to pt.   Person(s) Educated Patient   Methods Explanation;Demonstration;Verbal cues;Handout   Comprehension Verbalized understanding;Returned demonstration;Verbal cues required;Need further instruction          PT Short Term Goals - 12/01/16 8270      PT SHORT TERM GOAL #1   Title patient to be independent with initial HEP (12/07/16)   Status On-going           PT Long Term Goals - 12/01/16 1214      PT LONG TERM GOAL #1   Title Patient to be independent wiht advanced HEP (01/04/17)   Status On-going     PT LONG TERM GOAL #2   Title Patient to imprive tissue quality as demonstrated by improved flexibility - both HS and adductors (01/04/17)  Status On-going     PT LONG TERM GOAL #3   Title Patient to report improved sensations of numbness at L knee (01/04/17)   Status On-going     PT LONG TERM GOAL #4   Title Patient to report ability to perform daily activities and leisure tasks without limitations (01/04/17)   Status On-going               Plan - 12/01/16 3833    Clinical Impression Statement Pt. doing well today noting HEP going well.  Good technique with HEP review today.  Treatment focusing on hip/knee strengthening and stretching with lumbar ROM activities.  Pt. tolerated all activities well.  4-way SLR initiated today without issue.  HEP updated to include 4-way SLR with red band.   PT Treatment/Interventions ADLs/Self Care Home Management;Cryotherapy;Electrical Stimulation;Iontophoresis 4mg /ml Dexamethasone;Moist Heat;Ultrasound;Neuromuscular re-education;Balance training;Therapeutic exercise;Therapeutic activities;Functional  mobility training;Patient/family education;Manual techniques;Passive range of motion;Vasopneumatic Device;Taping;Dry needling      Patient will benefit from skilled therapeutic intervention in order to improve the following deficits and impairments:  Decreased activity tolerance, Decreased mobility, Decreased strength, Pain  Visit Diagnosis: Acute pain of left knee  Low back pain, unspecified back pain laterality, unspecified chronicity, with sciatica presence unspecified  Other symptoms and signs involving the musculoskeletal system     Problem List Patient Active Problem List   Diagnosis Date Noted  . Left leg numbness 11/22/2016  . PSVT (paroxysmal supraventricular tachycardia) ([place]) 11/04/2015  . CAD (coronary artery disease) 11/04/2015  . Polypharmacy 12/18/2013  . Partial epilepsy with impairment of consciousness ([place]) 12/18/2013  . Encounter for long-term (current) use of medications 12/18/2013  . Seizure disorder ([place]) 05/10/2011  . Mixed sleep apnea 03/30/2011  . Mixed hyperlipidemia 11/24/2009    Bess Harvest, PTA 12/01/16 12:17 PM  [facility] [address]  [place], [place], [address] Phone: [phone]   Fax:  [phone]  Name: Hunter Vega MRN: [id number] Date of Birth: 1950/10/31

## 2016-12-04 ENCOUNTER — Ambulatory Visit: Payer: PPO | Admitting: Physical Therapy

## 2016-12-04 DIAGNOSIS — M25562 Pain in left knee: Secondary | ICD-10-CM | POA: Diagnosis not present

## 2016-12-04 DIAGNOSIS — R29898 Other symptoms and signs involving the musculoskeletal system: Secondary | ICD-10-CM

## 2016-12-04 DIAGNOSIS — M545 Low back pain: Secondary | ICD-10-CM

## 2016-12-04 NOTE — Patient Instructions (Signed)
Straight Leg Raise: With External Leg Rotation   Lie on back with right leg straight, opposite leg bent. Rotate straight leg out and lift __6-8__ inches. Repeat _15___ times per set. Do __2__ sets per session.   Knee Extension: Step-Down Forward / Sideways / Backward (Eccentric)    Stand, holding support, affected foot on step. Slowly bend affected knee for 3-5 seconds and bring other heel forward to floor.  Repeat, placing foot flat to side.  Repeat, touching toe behind. _15__ reps per set, _2__ sets per day.

## 2016-12-04 NOTE — Therapy (Signed)
[facility] [address]  [place], [place], [address] Phone: [phone]   Fax:  [phone]  Physical Therapy Treatment  Patient Details  Name: Hunter Vega MRN: [id number] Date of Birth: 09/24/1950 Referring Provider: Dr. Karlton Lemon  Encounter Date: 12/04/2016      PT End of Session - 12/04/16 0753    Visit Number 3   Number of Visits 12   Date for PT Re-Evaluation 01/04/17   Authorization Type Medicare   PT Start Time 0752   PT Stop Time 0834   PT Time Calculation (min) 42 min   Activity Tolerance Patient tolerated treatment well   Behavior During Therapy [facility] for tasks assessed/performed      No past medical history on file.  No past surgical history on file.  There were no vitals filed for this visit.      Subjective Assessment - 12/04/16 0752    Subjective had a good weekend - feels like numbness at knee is getting better   Pertinent History cardiac stents, hx of seizure (last 4 years ago)   Patient Stated Goals improve weakness and numbness   Currently in Pain? No/denies   Pain Score 0-No pain                         [facility] Adult PT Treatment/Exercise - 12/04/16 0754      Knee/Hip Exercises: Stretches   Other Knee/Hip Stretches foam roller to quads - 3 way x 1 minute each     Knee/Hip Exercises: Aerobic   Recumbent Bike L2 x 6 minutes     Knee/Hip Exercises: Machines for Strengthening   Cybex Knee Extension 20# B con/L ecc x 15; 25# B con/L ecc x 15   Cybex Knee Flexion 20# B con/L ecc x 15     Knee/Hip Exercises: Standing   Step Down Left;2 sets;15 reps;Hand Hold: 2;Step Height: 6"   Step Down Limitations eccentric   Functional Squat 2 sets;15 reps   Functional Squat Limitations TRX; 2nd set with heel raise     Knee/Hip Exercises: Seated   Long Arc Quad Strengthening;Left;15 reps;Weights;2 sets   Long Arc Quad Weight 2 lbs.   Long Arc Quad Limitations with ball  squeeze     Knee/Hip Exercises: Supine   Bridges with [employer] Both;15 reps;2 sets   Straight Leg Raises Strengthening;Left;15 reps   Straight Leg Raises Limitations 2#   Straight Leg Raise with External Rotation Strengthening;Left;15 reps   Straight Leg Raise with External Rotation Limitations 2#                  PT Short Term Goals - 12/01/16 3295      PT SHORT TERM GOAL #1   Title patient to be independent with initial HEP (12/07/16)   Status On-going           PT Long Term Goals - 12/01/16 1214      PT LONG TERM GOAL #1   Title Patient to be independent wiht advanced HEP (01/04/17)   Status On-going     PT LONG TERM GOAL #2   Title Patient to imprive tissue quality as demonstrated by improved flexibility - both HS and adductors (01/04/17)   Status On-going     PT LONG TERM GOAL #3   Title Patient to report improved sensations of numbness at L knee (01/04/17)   Status On-going     PT LONG  TERM GOAL #4   Title Patient to report ability to perform daily activities and leisure tasks without limitations (01/04/17)   Status On-going               Plan - 12/04/16 0754    Clinical Impression Statement Patient today with subjective reports of feeling like PT has been of benefit, even in the short time he has been here. Patient doing well with all strengthening progressions today, with some visible wekaness of L quad with eccentric work. Patient making good progress towards goals.    PT Treatment/Interventions ADLs/Self Care Home Management;Cryotherapy;Electrical Stimulation;Iontophoresis 4mg /ml Dexamethasone;Moist Heat;Ultrasound;Neuromuscular re-education;Balance training;Therapeutic exercise;Therapeutic activities;Functional mobility training;Patient/family education;Manual techniques;Passive range of motion;Vasopneumatic Device;Taping;Dry needling   Consulted and Agree with Plan of Care Patient      Patient will benefit from skilled therapeutic intervention  in order to improve the following deficits and impairments:  Decreased activity tolerance, Decreased mobility, Decreased strength, Pain  Visit Diagnosis: Acute pain of left knee  Low back pain, unspecified back pain laterality, unspecified chronicity, with sciatica presence unspecified  Other symptoms and signs involving the musculoskeletal system     Problem List Patient Active Problem List   Diagnosis Date Noted  . Left leg numbness 11/22/2016  . PSVT (paroxysmal supraventricular tachycardia) ([place]) 11/04/2015  . CAD (coronary artery disease) 11/04/2015  . Polypharmacy 12/18/2013  . Partial epilepsy with impairment of consciousness ([place]) 12/18/2013  . Encounter for long-term (current) use of medications 12/18/2013  . Seizure disorder ([place]) 05/10/2011  . Mixed sleep apnea 03/30/2011  . Mixed hyperlipidemia 11/24/2009    Lanney Gins, PT, DPT 12/04/16 8:36 AM   [facility] [address]  [place], [place], [address] Phone: [phone]   Fax:  [phone]  Name: Hunter Vega MRN: [id number] Date of Birth: 07/08/1950

## 2016-12-07 ENCOUNTER — Ambulatory Visit: Payer: PPO | Admitting: Physical Therapy

## 2016-12-07 DIAGNOSIS — R29898 Other symptoms and signs involving the musculoskeletal system: Secondary | ICD-10-CM

## 2016-12-07 DIAGNOSIS — M545 Low back pain: Secondary | ICD-10-CM

## 2016-12-07 DIAGNOSIS — M25562 Pain in left knee: Secondary | ICD-10-CM

## 2016-12-07 NOTE — Therapy (Signed)
[facility] [address]  [place], [place], [address] Phone: [phone]   Fax:  [phone]  Physical Therapy Treatment  Patient Details  Name: Hunter Vega MRN: [id number] Date of Birth: [birth year] Referring Provider: Dr. Karlton Lemon  Encounter Date: 12/07/2016      PT End of Session - 12/07/16 0800    Visit Number 4   Number of Visits 12   Date for PT Re-Evaluation 01/04/17   Authorization Type Medicare   PT Start Time 0800   PT Stop Time 0843   PT Time Calculation (min) 43 min   Activity Tolerance Patient tolerated treatment well   Behavior During Therapy [facility] for tasks assessed/performed      No past medical history on file.  No past surgical history on file.  There were no vitals filed for this visit.      Subjective Assessment - 12/07/16 0803    Subjective Pt noting some muscle soreness today which he feels may be partially from foam rolling, but feels that it has helped lessen the numbness he has been experiencing.   Pertinent History cardiac stents, hx of seizure (last 4 years ago)   Patient Stated Goals improve weakness and numbness   Currently in Pain? No/denies                         [facility] Adult PT Treatment/Exercise - 12/07/16 0800      Knee/Hip Exercises: Stretches   Other Knee/Hip Stretches foam roller to quads - 3 way x 1 minute each     Knee/Hip Exercises: Aerobic   Recumbent Bike L2 x 6 minutes     Knee/Hip Exercises: Machines for Strengthening   Cybex Knee Extension 25# B con/L ecc x 15   Cybex Knee Flexion 25# B con/L ecc x 15     Knee/Hip Exercises: Standing   Side Lunges Both;15 reps;3 seconds   Side Lunges Limitations TRX   Step Down Left;15 reps;2 sets;Step Height: 6";Hand Hold: 1   Step Down Limitations eccentric heel touch lat & fwd - 1 set each   Functional Squat 15 reps;2 sets   Functional Squat Limitations TRX; 2nd set with heel raise   SLS  with Vectors L SLS on blue foam oval with R toe tap to 4 balance bubbles   Other Standing Knee Exercises B Fitter hip abduction & extension (1 black) x15 each   Other Standing Knee Exercises B side-stepping & fwd/back monster walk with red TB x 79ft each                  PT Short Term Goals - 12/07/16 0804      PT SHORT TERM GOAL #1   Title patient to be independent with initial HEP (12/07/16)   Status Achieved           PT Long Term Goals - 12/01/16 1214      PT LONG TERM GOAL #1   Title Patient to be independent wiht advanced HEP (01/04/17)   Status On-going     PT LONG TERM GOAL #2   Title Patient to imprive tissue quality as demonstrated by improved flexibility - both HS and adductors (01/04/17)   Status On-going     PT LONG TERM GOAL #3   Title Patient to report improved sensations of numbness at L knee (01/04/17)   Status On-going     PT LONG TERM GOAL #4  Title Patient to report ability to perform daily activities and leisure tasks without limitations (01/04/17)   Status On-going               Plan - 12/07/16 0805    Clinical Impression Statement Pt progressing with PT, tolerating exercise progression well but still experiencing L sided proximal LE weakness with shaking due to fatigue/weakness noted with single limb activities, especially with eccentric focus.   PT Treatment/Interventions ADLs/Self Care Home Management;Cryotherapy;Electrical Stimulation;Iontophoresis 4mg /ml Dexamethasone;Moist Heat;Ultrasound;Neuromuscular re-education;Balance training;Therapeutic exercise;Therapeutic activities;Functional mobility training;Patient/family education;Manual techniques;Passive range of motion;Vasopneumatic Device;Taping;Dry needling   Consulted and Agree with Plan of Care Patient      Patient will benefit from skilled therapeutic intervention in order to improve the following deficits and impairments:  Decreased activity tolerance, Decreased mobility,  Decreased strength, Pain  Visit Diagnosis: Acute pain of left knee  Low back pain, unspecified back pain laterality, unspecified chronicity, with sciatica presence unspecified  Other symptoms and signs involving the musculoskeletal system     Problem List Patient Active Problem List   Diagnosis Date Noted  . Left leg numbness 11/22/2016  . PSVT (paroxysmal supraventricular tachycardia) ([place]) 11/04/2015  . CAD (coronary artery disease) 11/04/2015  . Polypharmacy 12/18/2013  . Partial epilepsy with impairment of consciousness ([place]) 12/18/2013  . Encounter for long-term (current) use of medications 12/18/2013  . Seizure disorder ([place]) 05/10/2011  . Mixed sleep apnea 03/30/2011  . Mixed hyperlipidemia 11/24/2009    Percival Spanish, PT, MPT 12/07/2016, 8:58 AM  [facility] [address]  [place], [place], [address] Phone: [phone]   Fax:  [phone]  Name: Hunter Vega MRN: [id number] Date of Birth: 03/05/1951

## 2016-12-11 ENCOUNTER — Ambulatory Visit: Payer: PPO | Admitting: Physical Therapy

## 2016-12-11 DIAGNOSIS — M545 Low back pain: Secondary | ICD-10-CM

## 2016-12-11 DIAGNOSIS — M25562 Pain in left knee: Secondary | ICD-10-CM | POA: Diagnosis not present

## 2016-12-11 DIAGNOSIS — R29898 Other symptoms and signs involving the musculoskeletal system: Secondary | ICD-10-CM

## 2016-12-11 NOTE — Therapy (Signed)
[facility] [address]  [place], [place], [address] Phone: [phone]   Fax:  [phone]  Physical Therapy Treatment  Patient Details  Name: Hunter Vega MRN: [id number] Date of Birth: 1950-06-15 Referring Provider: Dr. Karlton Lemon  Encounter Date: 12/11/2016      PT End of Session - 12/11/16 0759    Visit Number 5   Number of Visits 12   Date for PT Re-Evaluation 01/04/17   Authorization Type Medicare   PT Start Time 0756   PT Stop Time 0835   PT Time Calculation (min) 39 min   Activity Tolerance Patient tolerated treatment well   Behavior During Therapy [facility] for tasks assessed/performed      No past medical history on file.  No past surgical history on file.  There were no vitals filed for this visit.      Subjective Assessment - 12/11/16 0758    Subjective had some achiness on Saturday - has resolved, feels like the numbness is improving.   Patient Stated Goals improve weakness and numbness   Currently in Pain? No/denies   Pain Score 0-No pain                         [facility] Adult PT Treatment/Exercise - 12/11/16 0800      Knee/Hip Exercises: Aerobic   Recumbent Bike L3 x 6 min     Knee/Hip Exercises: Machines for Strengthening   Cybex Knee Extension 30# B con/L ecc 2 x 15   Cybex Knee Flexion 30# B con/L ecc 2 x 15   Cybex Leg Press 25# B con/: ecc 2 x 15     Knee/Hip Exercises: Standing   Functional Squat 15 reps   Functional Squat Limitations BOSU   SLS with Vectors L SLS on blue foam oval with R toe tap to 4 cones x 10 reps   Other Standing Knee Exercises lateral weight shifts on BOSU x 10 reps     Knee/Hip Exercises: Supine   Single Leg Bridge Left;15 reps   Straight Leg Raise with External Rotation Strengthening;Left;15 reps   Straight Leg Raise with External Rotation Limitations 3#   Other Supine Knee/Hip Exercises straight leg bridge x 10 reps; straight leg  bridge + HS curl x 15                  PT Short Term Goals - 12/07/16 0804      PT SHORT TERM GOAL #1   Title patient to be independent with initial HEP (12/07/16)   Status Achieved           PT Long Term Goals - 12/01/16 1214      PT LONG TERM GOAL #1   Title Patient to be independent wiht advanced HEP (01/04/17)   Status On-going     PT LONG TERM GOAL #2   Title Patient to imprive tissue quality as demonstrated by improved flexibility - both HS and adductors (01/04/17)   Status On-going     PT LONG TERM GOAL #3   Title Patient to report improved sensations of numbness at L knee (01/04/17)   Status On-going     PT LONG TERM GOAL #4   Title Patient to report ability to perform daily activities and leisure tasks without limitations (01/04/17)   Status On-going               Plan - 12/11/16 0800  Clinical Impression Statement Patient continues to demonstrate excellent progression with all tasks while in PT. Good muscle control demonstrated today with all single leg and eccentric activities. Some weakness visible at L quad, however making good progress towards all goals.    PT Treatment/Interventions ADLs/Self Care Home Management;Cryotherapy;Electrical Stimulation;Iontophoresis 4mg /ml Dexamethasone;Moist Heat;Ultrasound;Neuromuscular re-education;Balance training;Therapeutic exercise;Therapeutic activities;Functional mobility training;Patient/family education;Manual techniques;Passive range of motion;Vasopneumatic Device;Taping;Dry needling   Consulted and Agree with Plan of Care Patient      Patient will benefit from skilled therapeutic intervention in order to improve the following deficits and impairments:  Decreased activity tolerance, Decreased mobility, Decreased strength, Pain  Visit Diagnosis: Acute pain of left knee  Low back pain, unspecified back pain laterality, unspecified chronicity, with sciatica presence unspecified  Other symptoms and signs  involving the musculoskeletal system     Problem List Patient Active Problem List   Diagnosis Date Noted  . Left leg numbness 11/22/2016  . PSVT (paroxysmal supraventricular tachycardia) ([place]) 11/04/2015  . CAD (coronary artery disease) 11/04/2015  . Polypharmacy 12/18/2013  . Partial epilepsy with impairment of consciousness ([place]) 12/18/2013  . Encounter for long-term (current) use of medications 12/18/2013  . Seizure disorder ([place]) 05/10/2011  . Mixed sleep apnea 03/30/2011  . Mixed hyperlipidemia 11/24/2009     Lanney Gins, PT, DPT 12/11/16 8:37 AM   [facility] [address]  [place], [place], [address] Phone: [phone]   Fax:  [phone]  Name: Hunter Vega MRN: [id number] Date of Birth: 1950-12-27

## 2016-12-13 ENCOUNTER — Telehealth: Payer: Self-pay

## 2016-12-13 NOTE — Telephone Encounter (Signed)
Pre Visit call completed. 

## 2016-12-14 ENCOUNTER — Encounter: Payer: Self-pay | Admitting: Medical

## 2016-12-14 ENCOUNTER — Ambulatory Visit: Payer: PPO

## 2016-12-14 ENCOUNTER — Ambulatory Visit (INDEPENDENT_AMBULATORY_CARE_PROVIDER_SITE_OTHER): Payer: PPO | Admitting: Medical

## 2016-12-14 VITALS — BP 126/71 | HR 71 | Temp 98.1°F | Resp 16 | Ht 68.0 in | Wt 159.6 lb

## 2016-12-14 DIAGNOSIS — M25562 Pain in left knee: Secondary | ICD-10-CM

## 2016-12-14 DIAGNOSIS — I2510 Atherosclerotic heart disease of native coronary artery without angina pectoris: Secondary | ICD-10-CM | POA: Diagnosis not present

## 2016-12-14 DIAGNOSIS — R29898 Other symptoms and signs involving the musculoskeletal system: Secondary | ICD-10-CM

## 2016-12-14 DIAGNOSIS — E785 Hyperlipidemia, unspecified: Secondary | ICD-10-CM

## 2016-12-14 DIAGNOSIS — Z87898 Personal history of other specified conditions: Secondary | ICD-10-CM | POA: Diagnosis not present

## 2016-12-14 DIAGNOSIS — M545 Low back pain: Secondary | ICD-10-CM

## 2016-12-14 NOTE — Therapy (Addendum)
[facility] [address]  [place], [place], [address] Phone: [phone]   Fax:  [phone]  Physical Therapy Treatment  Patient Details  Name: Hunter Vega MRN: [id number] Date of Birth: 11/18/1950 Referring Provider: Dr. Karlton Lemon  Encounter Date: 12/14/2016      PT End of Session - 12/14/16 0938    Visit Number 6   Number of Visits 12   Date for PT Re-Evaluation 01/04/17   Authorization Type Medicare   PT Start Time 0932   PT Stop Time 1015   PT Time Calculation (min) 43 min   Activity Tolerance Patient tolerated treatment well   Behavior During Therapy [facility] for tasks assessed/performed      Past Medical History:  Diagnosis Date  . History of seizure   . Hyperlipidemia   . PSVT (paroxysmal supraventricular tachycardia) ([place])   . Sleep apnea     No past surgical history on file.  There were no vitals filed for this visit.      Subjective Assessment - 12/14/16 0937    Subjective Doing well today.  No issues with racketball.  Thinks he may not be able to make it in to therapy for another appointment due to last minute trip to help daughter move.     Patient Stated Goals improve weakness and numbness   Currently in Pain? No/denies   Pain Score 0-No pain   Multiple Pain Sites No            [facility] PT Assessment - 12/14/16 0001      Observation/Other Assessments   Focus on Therapeutic Outcomes (FOTO)  90% (10% limitation)                     [facility] Adult PT Treatment/Exercise - 12/14/16 0947      Knee/Hip Exercises: Stretches   Passive Hamstring Stretch Right;Left;1 rep;30 seconds   Passive Hamstring Stretch Limitations supine with strap  bilaterally ~90 dg    Other Knee/Hip Stretches Butterfly bil adductor stretch x 60 sec      Knee/Hip Exercises: Aerobic   Recumbent Bike L3 x 6 min     Knee/Hip Exercises: Standing   Forward Step Up 10 reps;Step Height: 8"   Forward  Step Up Limitations Forward step up and over with slow eccentric lowering   Step Down Left;15 reps;Step Height: 6";Hand Hold: 1   Step Down Limitations eccentric heel touch lat    Functional Squat 20 reps  partial squats    Functional Squat Limitations BOSU   Other Standing Knee Exercises side stepping with red TB around ankles standing on blue foam balance beam x 7 laps;  1 ski pole    Other Standing Knee Exercises side stepping with red TB around ankles 2 x 50 ft                 PT Education - 12/14/16 1246    Education provided Yes   Education Details butterfly stretch    Person(s) Educated Patient   Methods Explanation;Demonstration;Verbal cues;Handout   Comprehension Verbalized understanding;Returned demonstration;Verbal cues required;Need further instruction          PT Short Term Goals - 12/07/16 0804      PT SHORT TERM GOAL #1   Title patient to be independent with initial HEP (12/07/16)   Status Achieved           PT Long Term Goals - 12/14/16 2563  PT LONG TERM GOAL #1   Title Patient to be independent wiht advanced HEP (01/04/17)   Status Partially Met  7.19.18: met for current      PT LONG TERM GOAL #2   Title Patient to imprive tissue quality as demonstrated by improved flexibility - both HS and adductors (01/04/17)   Status Achieved  7.19.18: pt. ~ 90 dg bilaterally with HS.  Noting improvement in B adductor flexibiliity.      PT LONG TERM GOAL #3   Title Patient to report improved sensations of numbness at L knee (01/04/17)   Status Achieved  7.19.18: Only slight numbness at L knee.  Pt. noting much improved.      PT LONG TERM GOAL #4   Title Patient to report ability to perform daily activities and leisure tasks without limitations (01/04/17)   Status Partially Met  7.19.18: Still somewhat limited by weakness with prolonged kneeling with washing/waxing car.  No longer limited with other tasks.                 Plan - 12/14/16 0941     Clinical Impression Statement Pt. noting today may need to be his last day of therapy due to last minute trip leaving tomorrow to help daughter move before trip to [place].  Had to cancel upcoming MD f/u due to last minute trip.  Pt. has made good progress with therapy thus far in POC.  Able to achieve or partially achieve all LTG's.  Still limited by weakness with prolonged kneeling while washing/waxing car however no limitations with other tasks.  Noting good improvement with L knee numbness.  Visible improved with LE flexibility with pt. noting improvement as well.  Pt. to contact clinic upon return from trip to [place] for possible scheduling of further visits.     PT Treatment/Interventions ADLs/Self Care Home Management;Cryotherapy;Electrical Stimulation;Iontophoresis 30m/ml Dexamethasone;Moist Heat;Ultrasound;Neuromuscular re-education;Balance training;Therapeutic exercise;Therapeutic activities;Functional mobility training;Patient/family education;Manual techniques;Passive range of motion;Vasopneumatic Device;Taping;Dry needling      Patient will benefit from skilled therapeutic intervention in order to improve the following deficits and impairments:  Decreased activity tolerance, Decreased mobility, Decreased strength, Pain  Visit Diagnosis: Acute pain of left knee  Low back pain, unspecified back pain laterality, unspecified chronicity, with sciatica presence unspecified  Other symptoms and signs involving the musculoskeletal system     G-Codes    Functional Assessment Tool Used (Outpatient Only) FOTO: 90 (10% limited)   Functional Limitation Mobility: Walking and moving around   Mobility: Walking and Moving Around Goal Status [phone] At least 20 percent but less than 40 percent impaired, limited or restricted   Mobility: Walking and Moving Around Discharge Status [phone] At least 1 percent but less than 20 percent impaired, limited or restricted     Problem List Patient  Active Problem List   Diagnosis Date Noted  . Left leg numbness 11/22/2016  . PSVT (paroxysmal [place]ricular tachycardia) ([place] 11/04/2015  . CAD (coronary artery disease) 11/04/2015  . Polypharmacy 12/18/2013  . [place] epilepsy with impairment of consciousness ([place] 12/18/2013  . Encounter for long-term (current) use of medications 12/18/2013  . [place] 05/10/2011  . Mixed sleep apnea 03/30/2011  . Mixed hyperlipidemia 11/24/2009   MBess Harvest PTA 12/14/16 1:17 PM   PHYSICAL THERAPY DISCHARGE SUMMARY  Visits from Start of Care: 6  Current functional level related to goals / functional outcomes: See above   Remaining deficits: See above   Education / Equipment: HEP  Plan: Patient agrees to discharge.  Patient goals were partially met. Patient is being discharged due to the patient's request.  ?????    Lanney Gins, PT, DPT 01/11/17 1:30 PM   [facility] [address]  [place], [place], [address] Phone: [phone]   Fax:  [phone]  Name: Marcelis Wissner MRN: [id number] Date of Birth: 03/13/1951

## 2016-12-14 NOTE — Progress Notes (Signed)
Subjective:    Patient ID: Hunter Vega, male    DOB: 1950-12-11, 66 y.o.   MRN: [id number]  HPI  Pt in for first time with me. To establish care.  Recent left knee numbness to left knee. Resolving with PT. He states last session will be today and he will continue with exercises at home.  Pt is retired. 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 Pt does work out regularly. Raquet ball 3 times a week. Some weights couple of days. Light weights. 1 cup of coffee. He eats moderate healthy. Married- 3 children(all girls).    Hx of seizure disorder- Pt last seizure sept 2015. Pt neurologist. will be Dr. Jannifer Franklin. Last was with [facility]. Formerly on [place]. Has been on lamictal. Seizures started as adult. No cause found.   Hx of PSVT- Pt sees cardiologist. He does not remember any symptomatic event.  Hyperlipidemia hx-   Pt is on liptor and baby aspirin.   Hx of CAD- found on stress test. Pt has stent.   Review of Systems  Constitutional: Negative for chills, fatigue and fever.  HENT: Negative for dental problem, mouth sores, postnasal drip, sinus pain and sinus pressure.   Respiratory: Negative for cough, chest tightness, shortness of breath and wheezing.   Cardiovascular: Negative for chest pain and palpitations.  Gastrointestinal: Negative for abdominal pain, constipation, diarrhea and vomiting.  Musculoskeletal: Negative for back pain, neck pain and neck stiffness.       Knee symptoms resolving.  Skin: Negative for rash.  Neurological: Negative for dizziness, speech difficulty, weakness, numbness and headaches.  Hematological: Negative for adenopathy. Does not bruise/bleed easily.  Psychiatric/Behavioral: Negative for decreased concentration, hallucinations and suicidal ideas. The patient is not nervous/anxious.     Past Medical History:  Diagnosis Date  . History of seizure   . Hyperlipidemia   . PSVT (paroxysmal supraventricular tachycardia) ([place])   . Sleep apnea      Social  History   Social History  . Marital status: Married    Spouse name: N/A  . Number of children: N/A  . Years of education: N/A   Occupational History  . Not on file.   Social History Main Topics  . Smoking status: Never Smoker  . Smokeless tobacco: Never Used  . Alcohol use No  . Drug use: No  . Sexual activity: Not on file   Other Topics Concern  . Not on file   Social History Narrative  . No narrative on file    No past surgical history on file.  No family history on file.  Allergies  Allergen Reactions  . Penicillins     REACTION: swelling    Current Outpatient Prescriptions on File Prior to Visit  Medication Sig Dispense Refill  . aspirin EC 81 MG tablet Take by mouth.    Marland Kitchen atorvastatin (LIPITOR) 80 MG tablet Take 80 mg by mouth.    . gabapentin (NEURONTIN) 300 MG capsule     . lamoTRIgine (LAMICTAL) 150 MG tablet Take 150 mg by mouth.     No current facility-administered medications on file prior to visit.     BP 126/71   Pulse 71   Temp 98.1 F (36.7 C) (Oral)   Resp 16   Ht 5\' 8"  (1.727 m)   Wt 159 lb 9.6 oz (72.4 kg)   SpO2 100%   BMI 24.27 kg/m       Objective:   Physical Exam  General Mental Status- Alert. General Appearance- Not in  acute distress.   Skin General: Color- Normal Color. Moisture- Normal Moisture.  Neck Carotid Arteries- Normal color. Moisture- Normal Moisture. No carotid bruits. No JVD.  Chest and Lung Exam Auscultation: Breath Sounds:-Normal.  Cardiovascular Auscultation:Rythm- Regular. Murmurs & Other Heart Sounds:Auscultation of the heart reveals- No Murmurs.  Abdomen Inspection:-Inspeection Normal. Palpation/Percussion:Note:No mass. Palpation and Percussion of the abdomen reveal- Non Tender, Non Distended + BS, no rebound or guarding.   Neurologic Cranial Nerve exam:- CN III-XII intact(No nystagmus), symmetric smile. Strength:- 5/5 equal and symmetric strength both upper and lower extremities.  Lt knee-  from, no pain. Faint crepitus on rom.      Assessment & Plan:  For seizure history continue lamictal and put in referral to neurologist. Anderson Malta and Steffanie Dunn are referral staff in our office who you can for update in 2 weeks if not call by then from neurologist office.(cbc and lamictal level)  For high cholesterol placing cmp and lipid panel future order to get done fasting.   Continue healthy lifestyle diet and exercise.   Follow up late October or as needed(Flu vaccine at that time)  [name], PA-C

## 2016-12-14 NOTE — Patient Instructions (Addendum)
For seizure history continue lamictal and put in referral to neurologist. Anderson Malta and Steffanie Dunn are referral staff in our office who you can for update in 2 weeks if not call by then from neurologist office.(cbc and lamictal level)  For high cholesterol placing cmp and lipid panel future order to get done fasting.   Continue healthy lifestyle diet and exercise.   Follow up late October or as needed(Flu vaccine at that time)

## 2016-12-18 ENCOUNTER — Ambulatory Visit: Payer: PPO

## 2016-12-20 ENCOUNTER — Ambulatory Visit: Payer: PPO | Admitting: Family Medicine

## 2016-12-21 ENCOUNTER — Ambulatory Visit: Payer: PPO | Admitting: Physical Therapy

## 2016-12-21 ENCOUNTER — Other Ambulatory Visit: Payer: PPO

## 2017-02-02 ENCOUNTER — Telehealth: Payer: Self-pay | Admitting: Medical

## 2017-02-02 NOTE — Telephone Encounter (Signed)
Caller Name  Riemann,Hunter Vega Relation to pt: spouse  Call back number: [phone]    Reason for call:  Spouse states insurance is requesting Dx code for the Hep C blood test to ensure its covered 100%, please advise

## 2017-02-02 NOTE — Telephone Encounter (Signed)
Looks like this is Jeyren's pt

## 2017-02-02 NOTE — Telephone Encounter (Signed)
Hunter Vega - [phone]  Pt's spouse called in to be advised. She would like to know if pt need his Hep C vac? If so, please place orders.

## 2017-02-03 ENCOUNTER — Telehealth: Payer: Self-pay | Admitting: Medical

## 2017-02-03 DIAGNOSIS — Z1159 Encounter for screening for other viral diseases: Secondary | ICD-10-CM

## 2017-02-03 NOTE — Telephone Encounter (Signed)
Need for hep c screening. Code should be z11.59. You can advise pt to mention this to insurance company(get them to double check). This is standard code for his age group. There is a high risk group diagnosis code for iv drug users/history of. I did not use high risk code as I did not get that from his history.  If not high risk group and insurance not giving clear cut answer on coverage then I would no push getting screening test done.

## 2017-02-03 NOTE — Telephone Encounter (Signed)
Screening hep c future order placed

## 2017-02-05 ENCOUNTER — Telehealth: Payer: Self-pay | Admitting: Medical

## 2017-02-05 NOTE — Telephone Encounter (Signed)
Called and [facility] @ 2:43pm @ [phone]) asking the pt's wife to RTC regarding the message below.//AB/CMA

## 2017-02-05 NOTE — Telephone Encounter (Signed)
I could not find in Epic that I was authorized speak to the wife about patient's medical condition. So I did not call her number/direct number to patient's wife. However did call patient's number and left a message for him explained  if he called our office then would explain hepatitis C screening test(criteria).  If I am too busy then you could explain this to patient the below.   Generally screening is recommended for those Born between [place].  I had asked him if he was interested in getting the test to see if his insurance covers testing. Some do and some don't. I ordered his antibody study as low risk.  Through my discussion with patient on day of visit he had no reported history of IV drug use. That is the most important risk factor and that would place him at high risk.  Other risk is having having ever had sex with IV drug user. I would imagine this is not applicable for him but is a risk factor for those that have.  If one has ever had a blood transfusion before 1992.  And getting tattooed is another risk factor.  Again the highest risk is personal IV drug use or history of.  If all these are negative then would would not push to be tested.    Apparently his insurance is more stringent reasons. I get the impressions some insurance cover test even if not high risk.

## 2017-02-05 NOTE — Telephone Encounter (Signed)
Patients wife would like to know why it is necessary for patient to have Hep C screening. States the test will need to be PA through his insurance and he will come in for labs after this is done.

## 2017-02-08 ENCOUNTER — Encounter: Payer: PPO | Admitting: Medical

## 2017-02-08 NOTE — Telephone Encounter (Signed)
See note from 02/05/17.

## 2017-02-08 NOTE — Telephone Encounter (Signed)
Called patient and discussed with him.  Stated understanding, but stated he would like to pass on the screening for now.  He agreed to let us know if he changes his mind.

## 2017-03-22 ENCOUNTER — Ambulatory Visit (INDEPENDENT_AMBULATORY_CARE_PROVIDER_SITE_OTHER): Payer: PPO | Admitting: Medical

## 2017-03-22 ENCOUNTER — Encounter: Payer: Self-pay | Admitting: Medical

## 2017-03-22 VITALS — BP 138/85 | HR 65 | Temp 97.5°F | Resp 16 | Ht 68.0 in | Wt 159.4 lb

## 2017-03-22 DIAGNOSIS — Z87898 Personal history of other specified conditions: Secondary | ICD-10-CM | POA: Diagnosis not present

## 2017-03-22 DIAGNOSIS — E785 Hyperlipidemia, unspecified: Secondary | ICD-10-CM | POA: Diagnosis not present

## 2017-03-22 DIAGNOSIS — Z23 Encounter for immunization: Secondary | ICD-10-CM

## 2017-03-22 DIAGNOSIS — J3089 Other allergic rhinitis: Secondary | ICD-10-CM | POA: Diagnosis not present

## 2017-03-22 MED ORDER — FLUTICASONE PROPIONATE 50 MCG/ACT NA SUSP: 2.0000 | Freq: Every day | NASAL | 2 refills | Status: DC

## 2017-03-22 MED ORDER — LEVOCETIRIZINE DIHYDROCHLORIDE 5 MG PO TABS: 5.0000 mg | ORAL_TABLET | Freq: Every evening | ORAL | 0 refills | Status: DC

## 2017-03-22 NOTE — Addendum Note (Signed)
Addended by: Hinton Dyer on: 03/22/2017 01:28 PM   Modules accepted: Orders

## 2017-03-22 NOTE — Progress Notes (Signed)
Subjective:    Patient ID: Hunter Vega, male    DOB: 10-31-1950, 66 y.o.   MRN: [id number]  HPI  Pt has some nasal congestion for about a month. Pt feels pnd and clearing his throat. No major sneezing. Occasionally when he blows his nose will see colored. No fever, no chills or sweats.  Pt update me that he did see neurologist. Pt still on lamictal. No seizures since last visit.  Pt willing to get flu vaccine.   Pt has high cholesterol history but not fasting. He will return tomorrow for fasting labs.  Pt still exercising. Pt has history of psvt. Pt has not had any palpiation. No chest pain. Pt is seeing Cardiologist.  Pt left knee that he had in summer cleared up/resolved..  Pt is up to date on colonoscopy.  Last tetanus in 2011.  Shingles vaccin zostavax  in 2015.   Review of Systems  Constitutional: Negative for chills, fatigue and fever.  HENT: Positive for congestion and postnasal drip. Negative for facial swelling and nosebleeds.   Respiratory: Negative for apnea, choking, shortness of breath and wheezing.   Cardiovascular: Negative for chest pain and palpitations.  Gastrointestinal: Negative for abdominal distention, abdominal pain, constipation, nausea and vomiting.  Genitourinary: Negative for decreased urine volume, dysuria, flank pain, frequency, testicular pain and urgency.  Musculoskeletal: Negative for back pain, myalgias and neck stiffness.       See hpi.  Skin: Negative for rash.  Neurological: Negative for dizziness, tremors, seizures, weakness and headaches.  Hematological: Negative for adenopathy. Does not bruise/bleed easily.  Psychiatric/Behavioral: Negative for behavioral problems, confusion and suicidal ideas. The patient is not nervous/anxious.      Past Medical History:  Diagnosis Date  . History of seizure   . Hyperlipidemia   . PSVT (paroxysmal supraventricular tachycardia) ([place])   . Sleep apnea      Social History   Social History  .  Marital status: Married    Spouse name: N/A  . Number of children: N/A  . Years of education: N/A   Occupational History  . Not on file.   Social History Main Topics  . Smoking status: Never Smoker  . Smokeless tobacco: Never Used  . Alcohol use No  . Drug use: No  . Sexual activity: Yes   Other Topics Concern  . Not on file   Social History Narrative  . No narrative on file    No past surgical history on file.  No family history on file.  Allergies  Allergen Reactions  . Penicillins     REACTION: swelling    Current Outpatient Prescriptions on File Prior to Visit  Medication Sig Dispense Refill  . aspirin EC 81 MG tablet Take by mouth.    Marland Kitchen atorvastatin (LIPITOR) 80 MG tablet Take 80 mg by mouth.    . lamoTRIgine (LAMICTAL) 150 MG tablet Take 150 mg by mouth.    . gabapentin (NEURONTIN) 300 MG capsule      No current facility-administered medications on file prior to visit.     BP (!) 144/68   Pulse 65   Temp (!) 97.5 F (36.4 C) (Oral)   Resp 16   Ht 5\' 8"  (1.727 m)   Wt 159 lb 6.4 oz (72.3 kg)   SpO2 100%   BMI 24.24 kg/m       Objective:   Physical Exam   General Mental Status- Alert. General Appearance- Not in acute distress.   Skin General: Color- Normal  Color. Moisture- Normal Moisture.  Neck Carotid Arteries- Normal color. Moisture- Normal Moisture. No carotid bruits. No JVD.  Chest and Lung Exam Auscultation: Breath Sounds:-Normal.  Cardiovascular Auscultation:Rythm- Regular. Murmurs & Other Heart Sounds:Auscultation of the heart reveals- No Murmurs.  Abdomen Inspection:-Inspeection Normal. Palpation/Percussion:Note:No mass. Palpation and Percussion of the abdomen reveal- Non Tender, Non Distended + BS, no rebound or guarding.   Neurologic Cranial Nerve exam:- CN III-XII intact(No nystagmus), symmetric smile. Strength:- 5/5 equal and symmetric strength both upper and lower extremities.    HEENT Head-  Normal. Ear Auditory Canal - Left- Normal. Right - Normal.Tympanic Membrane- Left- Normal. Right- Normal. Eye Sclera/Conjunctiva- Left- Normal. Right- Normal. Nose & Sinuses Nasal Mucosa- Left-  Boggy and Congested. Right-  Boggy and  Congested.Bilateral no maxillary and  No frontal sinus pressure. Mouth & Throat Lips: Upper Lip- Normal: no dryness, cracking, pallor, cyanosis, or vesicular eruption. Lower Lip-Normal: no dryness, cracking, pallor, cyanosis or vesicular eruption. Buccal Mucosa- Bilateral- No Aphthous ulcers. Oropharynx- No Discharge or Erythema. +pnd. Tonsils: Characteristics- Bilateral- No Erythema or Congestion. Size/Enlargement- Bilateral- No enlargement. Discharge- bilateral-None.        Assessment & Plan:  For your history of seizures, continue with Lamictal and regular follow-ups with neurologist.  We will get Lamictal level tomorrow.  For high cholesterol, continue with atorvastatin and will get fasting blood work tomorrow to include lipid panel and metabolic panel.  Your recent nasal congestion and postnasal drainage I think likely represents allergic reaction.  Will prescribe Xyzal antihistamine and Flonase nasal spray.  If symptoms persist or worsen please let us know.  Flu vaccine given today.  Follow-up date to be determined after lab review.  Asked lab staff to remove hep c lab since not high risk and insurance indicated it they might not cover lab.  On schedule it listed him a CPE. Pt is medicare pt for for years. Not sure if they cover CPE. So I ordered labs by problem/diagnosis. He is also from other practice. I am thinking he likely had G0402 done at his prior practice and may in near future have him scheduled with RN to get [phone].  Geroldine Esquivias, Percell Miller, PA-C

## 2017-03-22 NOTE — Patient Instructions (Addendum)
For your history of seizures, continue with Lamictal and regular follow-ups with neurologist.  We will get Lamictal level tomorrow.  For high cholesterol, continue with atorvastatin and will get fasting blood work tomorrow to include lipid panel and metabolic panel.  Your recent nasal congestion and postnasal drainage I think likely represents allergic reaction.  Will prescribe Xyzal antihistamine and Flonase nasal spray.  If symptoms persist or worsen please let us know.  Flu vaccine given today.  Follow-up date to be determined after lab review.

## 2017-03-22 NOTE — Addendum Note (Signed)
Addended by: Caffie Pinto on: 03/22/2017 04:55 PM   Modules accepted: Orders

## 2017-03-23 ENCOUNTER — Other Ambulatory Visit (INDEPENDENT_AMBULATORY_CARE_PROVIDER_SITE_OTHER): Payer: PPO

## 2017-03-23 ENCOUNTER — Encounter: Payer: Self-pay | Admitting: Medical

## 2017-03-23 ENCOUNTER — Telehealth: Payer: Self-pay | Admitting: Medical

## 2017-03-23 DIAGNOSIS — I2510 Atherosclerotic heart disease of native coronary artery without angina pectoris: Secondary | ICD-10-CM | POA: Diagnosis not present

## 2017-03-23 DIAGNOSIS — E785 Hyperlipidemia, unspecified: Secondary | ICD-10-CM | POA: Diagnosis not present

## 2017-03-23 DIAGNOSIS — Z0000 Encounter for general adult medical examination without abnormal findings: Secondary | ICD-10-CM

## 2017-03-23 DIAGNOSIS — Z87898 Personal history of other specified conditions: Secondary | ICD-10-CM | POA: Diagnosis not present

## 2017-03-23 DIAGNOSIS — Z87448 Personal history of other diseases of urinary system: Secondary | ICD-10-CM

## 2017-03-23 LAB — CBC WITH DIFFERENTIAL/PLATELET
Basophils Absolute: 0.1 10*3/uL (ref 0.0–0.1)
Basophils Relative: 0.9 % (ref 0.0–3.0)
Eosinophils Absolute: 0.5 10*3/uL (ref 0.0–0.7)
Eosinophils Relative: 7.7 % — ABNORMAL HIGH (ref 0.0–5.0)
HCT: 40.8 % (ref 39.0–52.0)
Hemoglobin: 13.3 g/dL (ref 13.0–17.0)
Lymphocytes Relative: 20.3 % (ref 12.0–46.0)
Lymphs Abs: 1.3 10*3/uL (ref 0.7–4.0)
MCHC: 32.6 g/dL (ref 30.0–36.0)
MCV: 96.7 fl (ref 78.0–100.0)
Monocytes Absolute: 0.7 10*3/uL (ref 0.1–1.0)
Monocytes Relative: 9.9 % (ref 3.0–12.0)
Neutro Abs: 4 10*3/uL (ref 1.4–7.7)
Neutrophils Relative %: 61.2 % (ref 43.0–77.0)
Platelets: 165 10*3/uL (ref 150.0–400.0)
RBC: 4.22 Mil/uL (ref 4.22–5.81)
RDW: 13.5 % (ref 11.5–15.5)
WBC: 6.6 10*3/uL (ref 4.0–10.5)

## 2017-03-23 LAB — COMPREHENSIVE METABOLIC PANEL
ALT: 13 U/L (ref 0–53)
AST: 19 U/L (ref 0–37)
Albumin: 4.2 g/dL (ref 3.5–5.2)
Alkaline Phosphatase: 85 U/L (ref 39–117)
BUN: 19 mg/dL (ref 6–23)
CO2: 31 mEq/L (ref 19–32)
Calcium: 9.7 mg/dL (ref 8.4–10.5)
Chloride: 102 mEq/L (ref 96–112)
Creatinine, Ser: 1.02 mg/dL (ref 0.40–1.50)
GFR: 77.16 mL/min (ref >60.00)
Glucose, Bld: 90 mg/dL (ref 70–99)
Potassium: 4.7 mEq/L (ref 3.5–5.1)
Sodium: 139 mEq/L (ref 135–145)
Total Bilirubin: 1 mg/dL (ref 0.2–1.2)
Total Protein: 7.1 g/dL (ref 6.0–8.3)

## 2017-03-23 LAB — LIPID PANEL
Cholesterol: 141 mg/dL (ref 0–200)
HDL: 56.6 mg/dL (ref >39.00)
LDL Cholesterol: 66 mg/dL (ref 0–99)
NonHDL: 84.35
Total CHOL/HDL Ratio: 2
Triglycerides: 90 mg/dL (ref 0.0–149.0)
VLDL: 18 mg/dL (ref 0.0–40.0)

## 2017-03-23 NOTE — Addendum Note (Signed)
Addended by: Caffie Pinto on: 03/23/2017 08:10 AM   Modules accepted: Orders

## 2017-03-23 NOTE — Telephone Encounter (Signed)
Future urine order placed. Pt can drop off sample. Will notify him by my chart. I saw him yesterday and was ordering problem based exam and forgot to order urine.

## 2017-03-26 ENCOUNTER — Telehealth: Payer: Self-pay | Admitting: Medical

## 2017-03-26 ENCOUNTER — Other Ambulatory Visit: Payer: Self-pay

## 2017-03-26 NOTE — Telephone Encounter (Signed)
Will you place urine poct [id number]. Dx history of blood in urine.    Call pt and explain to him I did not place order. Apologize.Did he give sample that day. Make sure lab did not collect urine and do drug screen. Make sure they cancel any drug screen today if they accidentally please.

## 2017-03-26 NOTE — Telephone Encounter (Signed)
Notified pt. Pt will come in tomorrow to drop off urine sample

## 2017-03-27 ENCOUNTER — Other Ambulatory Visit (INDEPENDENT_AMBULATORY_CARE_PROVIDER_SITE_OTHER): Payer: PPO

## 2017-03-27 DIAGNOSIS — Z87448 Personal history of other diseases of urinary system: Secondary | ICD-10-CM | POA: Diagnosis not present

## 2017-03-27 DIAGNOSIS — Z0000 Encounter for general adult medical examination without abnormal findings: Secondary | ICD-10-CM | POA: Diagnosis not present

## 2017-03-27 LAB — POC URINALSYSI DIPSTICK (AUTOMATED)
Bilirubin, UA: NEGATIVE
Glucose, UA: NEGATIVE
Ketones, UA: NEGATIVE
Leukocytes, UA: NEGATIVE
Nitrite, UA: NEGATIVE
Protein, UA: NEGATIVE
Spec Grav, UA: 1.025
Urobilinogen, UA: 0.2 U/dL
pH, UA: 6

## 2017-03-28 ENCOUNTER — Encounter: Payer: Self-pay | Admitting: Medical

## 2017-03-28 LAB — URINE CULTURE
MICRO NUMBER:: 81215113
Result:: NO GROWTH
SPECIMEN QUALITY:: ADEQUATE

## 2017-03-28 LAB — LAMOTRIGINE LEVEL: Lamotrigine Lvl: 11 ug/mL (ref 4.0–18.0)

## 2017-03-29 ENCOUNTER — Encounter: Payer: Self-pay | Admitting: Medical

## 2017-04-11 NOTE — Progress Notes (Unsigned)
Pt called for status of UROLOGY referral.

## 2017-04-12 ENCOUNTER — Telehealth: Payer: Self-pay | Admitting: Medical

## 2017-04-12 DIAGNOSIS — R319 Hematuria, unspecified: Secondary | ICD-10-CM

## 2017-04-12 DIAGNOSIS — R972 Elevated prostate specific antigen [PSA]: Secondary | ICD-10-CM

## 2017-04-12 NOTE — Telephone Encounter (Signed)
Referral to urologist placed.  Looking in our media section for any records that show PSA values or UAs with positive findings of blood/hemoglobin.  Also asked patient if he has any PSA values in his records or UA results to take those to the urologist office visit.

## 2017-04-23 ENCOUNTER — Telehealth: Payer: Self-pay | Admitting: Medical

## 2017-04-23 NOTE — Telephone Encounter (Signed)
Copied from [place]. Topic: Medicare AWV >> Apr 23, 2017 10:24 AM Gerrie Nordmann wrote: Reason for CRM: Spoke with Mr. Gerrard to schedule AWV. Patient asked if he could receive a return call to schedule his appointment. SF

## 2017-04-24 NOTE — Telephone Encounter (Signed)
Patient called office back and scheduled wellness appt for April 2019. SF

## 2017-04-24 NOTE — Telephone Encounter (Signed)
Erroneous encounter Created in error 

## 2017-05-18 NOTE — Progress Notes (Addendum)
Subjective:   Hunter Vega is a 66 y.o. male who presents for an Initial Medicare Annual Wellness Visit.  The Patient was informed that the wellness visit is to identify future health risk and educate and initiate measures that can reduce risk for increased disease through the lifespan.   Describes health as fair, good or great? Good  Pt does complain of right groin pain that began about 3 wks ago when shoveling snow. Pt states pain is intermittent at 3/10. Pt states he has not taken anything for pain and will follow up with PCP to discuss issue next week.  Review of Systems  No ROS.  Medicare Wellness Visit. Additional risk factors are reflected in the social history. Cardiac Risk Factors include: advanced age (>54men, >57 women);dyslipidemia;male gender Sleep patterns:  Wakes once to urinate. Sleeps 7 hours per night. Feels rested. Home Safety/Smoke Alarms: Feels safe in home. Smoke alarms in place. Lives with wife. 2 story home. No issues with stairs.  Male:   CCS- last 06/14/09 -Pt reports normal with 10 yr recall   PSA- No results found for: PSA   Objective:    Today's Vitals   05/24/17 0804 05/24/17 0806  BP: 118/60   Pulse: 78   SpO2: 97%   Weight: 162 lb 6.4 oz (73.7 kg)   Height: 5' 7.5" (1.715 m)   PainSc:  3    Body mass index is 25.06 kg/m.  Advanced Directives 05/24/2017 11/23/2016 08/22/2016 07/25/2016  Does Patient Have a Medical Advance Directive? Yes Yes Yes Yes  Type of Paramedic of [place];Living will - [place];Living will [place];Living will  Does patient want to make changes to medical advance directive? - No - Patient declined - -  Copy of [place] in Chart? No - copy requested - - -    Current Medications (verified) Outpatient Encounter Medications as of 05/24/2017  Medication Sig  . aspirin EC 81 MG tablet Take by mouth.  Marland Kitchen atorvastatin (LIPITOR) 80 MG tablet  Take 80 mg by mouth.  . lamoTRIgine (LAMICTAL) 150 MG tablet Take 150 mg by mouth 2 (two) times daily.   . fluticasone (FLONASE) 50 MCG/ACT nasal spray Place 2 sprays into both nostrils daily. (Patient not taking: Reported on 05/24/2017)  . levocetirizine (XYZAL) 5 MG tablet Take 1 tablet (5 mg total) by mouth every evening. (Patient not taking: Reported on 05/24/2017)  . [DISCONTINUED] gabapentin (NEURONTIN) 300 MG capsule    No facility-administered encounter medications on file as of 05/24/2017.     Allergies (verified) Penicillins   History: Past Medical History:  Diagnosis Date  . History of seizure   . Hyperlipidemia   . PSVT (paroxysmal supraventricular tachycardia) ([place])   . Sleep apnea    History reviewed. No pertinent surgical history. History reviewed. No pertinent family history. Social History   Socioeconomic History  . Marital status: Married    Spouse name: None  . Number of children: None  . Years of education: None  . Highest education level: None  Social Needs  . Financial resource strain: None  . Food insecurity - worry: None  . Food insecurity - inability: None  . Transportation needs - medical: None  . Transportation needs - non-medical: None  Occupational History  . None  Tobacco Use  . Smoking status: Never Smoker  . Smokeless tobacco: Never Used  Substance and Sexual Activity  . Alcohol use: No  . Drug use: No  .  Sexual activity: Yes  Other Topics Concern  . None  Social History Narrative  . None   Tobacco Counseling Counseling given: Not Answered   Clinical Intake:  Pain : 0-10 Pain Score: 3  Pain Location: Groin Pain Orientation: Right Pain Radiating Towards: right scrotum Pain Relieving Factors: nothing  Pain Relieving Factors: nothing  Activities of Daily Living In your present state of health, do you have any difficulty performing the following activities: 05/24/2017  Hearing? N  Vision? N  Comment wears reading  glasses. Dr.Samuels yearly.   Difficulty concentrating or making decisions? N  Walking or climbing stairs? N  Dressing or bathing? N  Doing errands, shopping? N  Preparing Food and eating ? N  Using the Toilet? N  In the past six months, have you accidently leaked urine? N  Do you have problems with loss of bowel control? N  Managing your Medications? N  Managing your Finances? N  Housekeeping or managing your Housekeeping? N  Some recent data might be hidden     Immunizations and Health Maintenance Immunization History  Administered Date(s) Administered  . Influenza, High Dose Seasonal PF 03/22/2017   Health Maintenance Due  Topic Date Due  . TETANUS/TDAP  01/22/1968  . PNA vac Low Risk Adult (1 of 2 - PCV13) 01/21/2014    Patient Care Team: Saguier, Iris Pert as PCP - General (Internal Medicine)  Indicate any recent Medical Services you may have received from other than [facility] providers in the past year (date may be approximate).    Assessment:   This is a routine wellness examination for Hunter Vega. Physical assessment deferred to PCP.  Hearing/Vision screen Hearing Screening Comments: Able to hear conversational tones w/o difficulty. No issues reported.  Passed whisper screening.  Vision Screening Comments: Pt states he has yearly eye exams   Dietary issues and exercise activities discussed: Current Exercise Habits: Structured exercise class, Type of exercise: strength training/weights, Time (Minutes): 60, Frequency (Times/Week): 5, Weekly Exercise (Minutes/Week): 300, Intensity: Moderate Diet (meal preparation, eat out, water intake, caffeinated beverages, dairy products, fruits and vegetables): in general, a "healthy" diet         Goals    . Maintain current health  (pt-stated)      Depression Screen PHQ 2/9 Scores 05/24/2017 08/22/2016 07/25/2016  PHQ - 2 Score 0 0 0    Fall Risk Fall Risk  05/24/2017 08/22/2016 07/25/2016  Falls in the past year? No No No     Cognitive Function: Ad8 score reviewed for issues:  Issues making decisions:no  Less interest in hobbies / activities:no  Repeats questions, stories (family complaining):no  Trouble using ordinary gadgets (microwave, computer, phone):no  Forgets the month or year: no  Mismanaging finances: no  Remembering appts:no  Daily problems with thinking and/or memory:no Ad8 score is=0        Screening Tests Health Maintenance  Topic Date Due  . TETANUS/TDAP  01/22/1968  . PNA vac Low Risk Adult (1 of 2 - PCV13) 01/21/2014  . Hepatitis C Screening  05/29/2048 (Originally 03/20/1951)  . COLONOSCOPY  06/15/2019  . INFLUENZA VACCINE  Completed       Plan:   Follow up with [name] as scheduled.   Continue to eat heart healthy diet (full of fruits, vegetables, whole grains, lean protein, water--limit salt, fat, and sugar intake) and increase physical activity as tolerated.  Continue doing brain stimulating activities (puzzles, reading, adult coloring books, staying active) to keep memory sharp.   Please look over  information provided on Pneumonia vaccines and consider receiving at your next visit.   I have personally reviewed and noted the following in the patient's chart:   . Medical and social history . Use of alcohol, tobacco or illicit drugs  . Current medications and supplements . Functional ability and status . Nutritional status . Physical activity . Advanced directives . List of other physicians . Hospitalizations, surgeries, and ER visits in previous 12 months . Vitals . Screenings to include cognitive, depression, and falls . Referrals and appointments  In addition, I have reviewed and discussed with patient certain preventive protocols, quality metrics, and best practice recommendations. A written personalized care plan for preventive services as well as general preventive health recommendations were provided to patient.     Shela Nevin,  [place]   05/24/2017   Agree with evaluation and recommendation of RN.  Saguier, Percell Miller, PA-C

## 2017-05-24 ENCOUNTER — Ambulatory Visit (INDEPENDENT_AMBULATORY_CARE_PROVIDER_SITE_OTHER): Payer: PPO | Admitting: *Deleted

## 2017-05-24 ENCOUNTER — Encounter: Payer: Self-pay | Admitting: *Deleted

## 2017-05-24 VITALS — BP 118/60 | HR 78 | Ht 67.5 in | Wt 162.4 lb

## 2017-05-24 DIAGNOSIS — Z0000 Encounter for general adult medical examination without abnormal findings: Secondary | ICD-10-CM

## 2017-05-24 NOTE — Progress Notes (Signed)
I have reviewed MWE note by Ms. Britt and agree with her documentation- J Fidelis Loth MD  

## 2017-05-24 NOTE — Patient Instructions (Signed)
Follow up with Hunter Vega as scheduled.   Continue to eat heart healthy diet (full of fruits, vegetables, whole grains, lean protein, water--limit salt, fat, and sugar intake) and increase physical activity as tolerated.  Continue doing brain stimulating activities (puzzles, reading, adult coloring books, staying active) to keep memory sharp.   Please look over information provided on Pneumonia vaccines and consider receiving at your next visit.    Hunter Vega , Thank you for taking time to come for your Medicare Wellness Visit. I appreciate your ongoing commitment to your health goals. Please review the following plan we discussed and let me know if I can assist you in the future.   These are the goals we discussed: Goals    . Maintain current health  (pt-stated)       This is a list of the screening recommended for you and due dates:  Health Maintenance  Topic Date Due  . Tetanus Vaccine  01/22/1968  . Pneumonia vaccines (1 of 2 - PCV13) 01/21/2014  .  Hepatitis C: One time screening is recommended by Center for Disease Control  (CDC) for  adults born from [birth year] through 1965.   05/29/2048*  . Colon Cancer Screening  06/15/2019  . Flu Shot  Completed  *Topic was postponed. The date shown is not the original due date.     Health Maintenance, Male A healthy lifestyle and preventive care is important for your health and wellness. Ask your health care provider about what schedule of regular examinations is right for you. What should I know about weight and diet? Eat a Healthy Diet  Eat plenty of vegetables, fruits, whole grains, low-fat dairy products, and lean protein.  Do not eat a lot of foods high in solid fats, added sugars, or salt.  Maintain a Healthy Weight Regular exercise can help you achieve or maintain a healthy weight. You should:  Do at least 150 minutes of exercise each week. The exercise should increase your heart rate and make you sweat (moderate-intensity  exercise).  Do strength-training exercises at least twice a week.  Watch Your Levels of Cholesterol and Blood Lipids  Have your blood tested for lipids and cholesterol every 5 years starting at 68 years of age. If you are at high risk for heart disease, you should start having your blood tested when you are 68 years old. You may need to have your cholesterol levels checked more often if: ? Your lipid or cholesterol levels are high. ? You are older than 68 years of age. ? You are at high risk for heart disease.  What should I know about cancer screening? Many types of cancers can be detected early and may often be prevented. Lung Cancer  You should be screened every year for lung cancer if: ? You are a current smoker who has smoked for at least 30 years. ? You are a former smoker who has quit within the past 15 years.  Talk to your health care provider about your screening options, when you should start screening, and how often you should be screened.  Colorectal Cancer  Routine colorectal cancer screening usually begins at 68 years of age and should be repeated every 5-10 years until you are 68 years old. You may need to be screened more often if early forms of precancerous polyps or small growths are found. Your health care provider may recommend screening at an earlier age if you have risk factors for colon cancer.  Your health care provider  may recommend using home test kits to check for hidden blood in the stool.  A small camera at the end of a tube can be used to examine your colon (sigmoidoscopy or colonoscopy). This checks for the earliest forms of colorectal cancer.  Prostate and Testicular Cancer  Depending on your age and overall health, your health care provider may do certain tests to screen for prostate and testicular cancer.  Talk to your health care provider about any symptoms or concerns you have about testicular or prostate cancer.  Skin Cancer  Check your skin  from head to toe regularly.  Tell your health care provider about any new moles or changes in moles, especially if: ? There is a change in a mole's size, shape, or color. ? You have a mole that is larger than a pencil eraser.  Always use sunscreen. Apply sunscreen liberally and repeat throughout the day.  Protect yourself by wearing long sleeves, pants, a wide-brimmed hat, and sunglasses when outside.  What should I know about heart disease, diabetes, and high blood pressure?  If you are 55-78 years of age, have your blood pressure checked every 3-5 years. If you are 64 years of age or older, have your blood pressure checked every year. You should have your blood pressure measured twice-once when you are at a hospital or clinic, and once when you are not at a hospital or clinic. Record the average of the two measurements. To check your blood pressure when you are not at a hospital or clinic, you can use: ? An automated blood pressure machine at a pharmacy. ? A home blood pressure monitor.  Talk to your health care provider about your target blood pressure.  If you are between 67-75 years old, ask your health care provider if you should take aspirin to prevent heart disease.  Have regular diabetes screenings by checking your fasting blood sugar level. ? If you are at a normal weight and have a low risk for diabetes, have this test once every three years after the age of 27. ? If you are overweight and have a high risk for diabetes, consider being tested at a younger age or more often.  A one-time screening for abdominal aortic aneurysm (AAA) by ultrasound is recommended for men aged 78-75 years who are current or former smokers. What should I know about preventing infection? Hepatitis B If you have a higher risk for hepatitis B, you should be screened for this virus. Talk with your health care provider to find out if you are at risk for hepatitis B infection. Hepatitis C Blood testing is  recommended for:  Everyone born from [birth year] through 1967.  Anyone with known risk factors for hepatitis C.  Sexually Transmitted Diseases (STDs)  You should be screened each year for STDs including gonorrhea and chlamydia if: ? You are sexually active and are younger than 68 years of age. ? You are older than 68 years of age and your health care provider tells you that you are at risk for this type of infection. ? Your sexual activity has changed since you were last screened and you are at an increased risk for chlamydia or gonorrhea. Ask your health care provider if you are at risk.  Talk with your health care provider about whether you are at high risk of being infected with HIV. Your health care provider may recommend a prescription medicine to help prevent HIV infection.  What else can I do?  Schedule regular health,  dental, and eye exams.  Stay current with your vaccines (immunizations).  Do not use any tobacco products, such as cigarettes, chewing tobacco, and e-cigarettes. If you need help quitting, ask your health care provider.  Limit alcohol intake to no more than 2 drinks per day. One drink equals 12 ounces of beer, 5 ounces of wine, or 1 ounces of hard liquor.  Do not use street drugs.  Do not share needles.  Ask your health care provider for help if you need support or information about quitting drugs.  Tell your health care provider if you often feel depressed.  Tell your health care provider if you have ever been abused or do not feel safe at home. This information is not intended to replace advice given to you by your health care provider. Make sure you discuss any questions you have with your health care provider. Document Released: 11/11/2007 Document Revised: 01/12/2016 Document Reviewed: 02/16/2015 Elsevier Interactive Patient Education  Hunter Vega.

## 2017-05-30 DIAGNOSIS — I471 Supraventricular tachycardia: Secondary | ICD-10-CM | POA: Diagnosis not present

## 2017-05-30 DIAGNOSIS — E785 Hyperlipidemia, unspecified: Secondary | ICD-10-CM | POA: Diagnosis not present

## 2017-05-30 DIAGNOSIS — I25119 Atherosclerotic heart disease of native coronary artery with unspecified angina pectoris: Secondary | ICD-10-CM | POA: Diagnosis not present

## 2017-05-30 DIAGNOSIS — G40909 Epilepsy, unspecified, not intractable, without status epilepticus: Secondary | ICD-10-CM | POA: Diagnosis not present

## 2017-05-31 ENCOUNTER — Ambulatory Visit (INDEPENDENT_AMBULATORY_CARE_PROVIDER_SITE_OTHER): Payer: PPO | Admitting: Medical

## 2017-05-31 ENCOUNTER — Encounter: Payer: Self-pay | Admitting: Medical

## 2017-05-31 VITALS — BP 129/67 | HR 60 | Temp 98.1°F | Resp 16 | Ht 68.0 in | Wt 160.0 lb

## 2017-05-31 DIAGNOSIS — R1031 Right lower quadrant pain: Secondary | ICD-10-CM | POA: Diagnosis not present

## 2017-05-31 NOTE — Patient Instructions (Signed)
For your groin pain with possible hernia, I will refer you to general surgeon. You may have very small high riding early hernia. Try to limit heavy lifting. If area changes or worsens let us know.  Follow up as needed

## 2017-05-31 NOTE — Progress Notes (Signed)
Subjective:    Patient ID: Hunter Vega, male    DOB: 1950/09/09, 67 y.o.   MRN: [id number]  HPI  Pt states he has had rt groin pain before snow storm. About 5 weeks ago had pain. This occurred lifting weights. [place] 70 lbs. Some pain from groin toward scrotum at times. Pt has tapered off some. But lifting moderate heavy things will cause pain and he will feel a bulge in the inguinal canal region.    Review of Systems  Constitutional: Negative for chills, fatigue and fever.  Respiratory: Negative for cough, chest tightness, shortness of breath and wheezing.   Cardiovascular: Negative for chest pain and palpitations.  Gastrointestinal: Negative for abdominal distention, abdominal pain, blood in stool, constipation, diarrhea, nausea and vomiting.  Genitourinary:       See HPI  Musculoskeletal: Negative for back pain, myalgias and neck pain.  Neurological: Negative for dizziness, seizures, facial asymmetry, weakness, light-headedness and numbness.  Hematological: Negative for adenopathy. Does not bruise/bleed easily.  Psychiatric/Behavioral: Negative for behavioral problems, confusion, hallucinations, self-injury and sleep disturbance. The patient is not nervous/anxious.    Past Medical History:  Diagnosis Date  . History of seizure   . Hyperlipidemia   . PSVT (paroxysmal supraventricular tachycardia) ([place])   . Sleep apnea      Social History   Socioeconomic History  . Marital status: Married    Spouse name: Not on file  . Number of children: Not on file  . Years of education: Not on file  . Highest education level: Not on file  Social Needs  . Financial resource strain: Not on file  . Food insecurity - worry: Not on file  . Food insecurity - inability: Not on file  . Transportation needs - medical: Not on file  . Transportation needs - non-medical: Not on file  Occupational History  . Not on file  Tobacco Use  . Smoking status: Never Smoker  . Smokeless tobacco:  Never Used  Substance and Sexual Activity  . Alcohol use: No  . Drug use: No  . Sexual activity: Yes  Other Topics Concern  . Not on file  Social History Narrative  . Not on file    No past surgical history on file.  No family history on file.  Allergies  Allergen Reactions  . Penicillins     REACTION: swelling    Current Outpatient Medications on File Prior to Visit  Medication Sig Dispense Refill  . aspirin EC 81 MG tablet Take by mouth.    Marland Kitchen atorvastatin (LIPITOR) 80 MG tablet Take 80 mg by mouth.    . lamoTRIgine (LAMICTAL) 150 MG tablet Take 150 mg by mouth 2 (two) times daily.      No current facility-administered medications on file prior to visit.     BP 129/67 (BP Location: Right Arm, Patient Position: Sitting, Cuff Size: Small)   Pulse 60   Temp 98.1 F (36.7 C) (Oral)   Resp 16   Ht 5\' 8"  (1.727 m)   Wt 160 lb (72.6 kg)   SpO2 100%   BMI 24.33 kg/m       Objective:   Physical Exam   General Appearance- Not in acute distress.   Chest and Lung Exam Auscultation: Breath sounds:-Normal. Adventitious sounds:- No Adventitious sounds.  Cardiovascular Auscultation:Rythm - Regular. Heart Sounds -Normal heart sounds.  Abdomen Inspection:-Inspection Normal.  Palpation/Perucssion: Palpation and Percussion of the abdomen reveal- non Tender, No Rebound tenderness, No rigidity(Guarding) and No Palpable abdominal  masses.  Liver:-Normal.  Spleen:- Normal.   Back- no cva tenderness  Genital exam- testicle non-tender. Rt inguinal canal upper portion when cough can feel  small bulge that reduces. Not definite.  Possible mild defect felt.     Assessment & Plan:  For your groin pain with possible hernia, I will refer you to general surgeon. You may have very small high riding early hernia. Try to limit heavy lifting. If area changes or worsens let us know.  Follow up as needed  Graycen Degan, Percell Miller, PA-C

## 2017-06-03 DIAGNOSIS — J209 Acute bronchitis, unspecified: Secondary | ICD-10-CM | POA: Diagnosis not present

## 2017-06-21 DIAGNOSIS — E785 Hyperlipidemia, unspecified: Secondary | ICD-10-CM | POA: Diagnosis not present

## 2017-06-21 DIAGNOSIS — I25119 Atherosclerotic heart disease of native coronary artery with unspecified angina pectoris: Secondary | ICD-10-CM | POA: Diagnosis not present

## 2017-07-16 ENCOUNTER — Ambulatory Visit: Payer: Self-pay | Admitting: Surgery

## 2017-07-16 DIAGNOSIS — Z01818 Encounter for other preprocedural examination: Secondary | ICD-10-CM | POA: Diagnosis not present

## 2017-07-16 DIAGNOSIS — K4020 Bilateral inguinal hernia, without obstruction or gangrene, not specified as recurrent: Secondary | ICD-10-CM | POA: Diagnosis not present

## 2017-07-16 NOTE — H&P (Signed)
Lisabeth Pick Documented: 07/16/2017 3:06 PM Location: [facility] Patient #: [id number] DOB: [birth year] Married / Language: Cleophus Molt / Race: White Male  History of Present Illness Adin Hector MD; 07/16/2017 4:16 PM) The patient is a 67 year old male who presents with an inguinal hernia. Note for "Inguinal hernia": ` ` ` Patient sent for surgical consultation at the request of [name], [place]  Chief Complaint: Right groin pain. Probable early inguinal hernia  The patient is a pleasant active male. History of coronary stenting for atherosclerotic disease in 2012 at [facility]. Now just on baby aspirin. He exercises regularly. Plays racquetball. Does free weights. 1 of the free weights was falling out of his hand. When he stooped back to grab it he felt a sharp groin pain on the right side. Is been a persistent recurring pain. Worse with bending and lifting. It concerned him. Went to primary care office. Inguinal hernia suspected. Surgical consultation requested.  Patient moves his bowels about twice a week. Takes prunes occasionally. Gets up to urinate once a night but otherwise no problems with starting stream. No BPH. He does not smoke. No history of infection. No prior surgeries.   (Review of systems as stated in this history (HPI) or in the review of systems. Otherwise all other 12 point ROS are negative)   Past Surgical History Tyrone Pautsch, [place]; 07/16/2017 3:07 PM) Vasectomy  Diagnostic Studies History Gordon Vandunk, [place]; 07/16/2017 3:07 PM) Colonoscopy 5-10 years ago  Allergies Cato Liburd, CMA; 07/16/2017 3:08 PM) Penicillins  Medication History Abdul Beirne, CMA; 07/16/2017 3:10 PM) LamoTRIgine (150MG  Tablet, Oral) Active. Aspirin EC (81MG  Tablet DR, Oral) Active. Lipitor (80MG  Tablet, Oral) Active. Medications Reconciled  Social History Ras Kollman, [place]; 07/16/2017 3:07 PM) Alcohol use Occasional alcohol  use. Caffeine use Coffee. No drug use Tobacco use Never smoker.  Family History Shray Hunley, [place]; 07/16/2017 3:07 PM) Depression Mother. Heart Disease Father. Migraine Headache Sister.  Other Problems Kjuan Seipp, CMA; 07/16/2017 3:07 PM) Back Pain Other disease, cancer, significant illness Seizure Disorder     Review of Systems Valery Chance CMA; 07/16/2017 3:07 PM) General Not Present- Appetite Loss, Chills, Fatigue, Fever, Night Sweats, Weight Gain and Weight Loss. Skin Not Present- Change in Wart/Mole, Dryness, Hives, Jaundice, New Lesions, Non-Healing Wounds, Rash and Ulcer. HEENT Not Present- Earache, Hearing Loss, Hoarseness, Nose Bleed, Oral Ulcers, Ringing in the Ears, Seasonal Allergies, Sinus Pain, Sore Throat, Visual Disturbances, Wears glasses/contact lenses and Yellow Eyes. Respiratory Present- Snoring. Not Present- Bloody sputum, Chronic Cough, Difficulty Breathing and Wheezing. Breast Not Present- Breast Mass, Breast Pain, Nipple Discharge and Skin Changes. Cardiovascular Not Present- Chest Pain, Difficulty Breathing Lying Down, Leg Cramps, Palpitations, Rapid Heart Rate, Shortness of Breath and Swelling of Extremities. Gastrointestinal Not Present- Abdominal Pain, Bloating, Bloody Stool, Change in Bowel Habits, Chronic diarrhea, Constipation, Difficulty Swallowing, Excessive gas, Gets full quickly at meals, Hemorrhoids, Indigestion, Nausea, Rectal Pain and Vomiting. Male Genitourinary Not Present- Blood in Urine, Change in Urinary Stream, Frequency, Impotence, Nocturia, Painful Urination, Urgency and Urine Leakage. Musculoskeletal Present- Back Pain. Not Present- Joint Pain, Joint Stiffness, Muscle Pain, Muscle Weakness and Swelling of Extremities. Neurological Present- Seizures. Not Present- Decreased Memory, Fainting, Headaches, Numbness, Tingling, Tremor, Trouble walking and Weakness. Psychiatric Not Present- Anxiety, Bipolar, Change in Sleep Pattern,  Depression, Fearful and Frequent crying. Endocrine Not Present- Cold Intolerance, Excessive Hunger, Hair Changes, Heat Intolerance, Hot flashes and New Diabetes. Hematology Present- Blood Thinners and Easy Bruising. Not Present- Excessive bleeding, Gland  problems, HIV and Persistent Infections.  Vitals Jiro Kiester CMA; 07/16/2017 3:11 PM) 07/16/2017 3:10 PM Weight: 161.8 lb Height: 68in Body Surface Area: 1.87 m Body Mass Index: 24.6 kg/m  Temp.: 98.10F  Pulse: 88 (Regular)  BP: 130/80 (Sitting, Left Arm, Standard)      Physical Exam Adin Hector MD; 07/16/2017 4:13 PM)  General Mental Status-Alert. General Appearance-Not in acute distress, Not Sickly. Orientation-Oriented X3. Hydration-Well hydrated. Voice-Normal.  Integumentary Global Assessment Upon inspection and palpation of skin surfaces of the - Axillae: non-tender, no inflammation or ulceration, no drainage. and Distribution of scalp and body hair is normal. General Characteristics Temperature - normal warmth is noted.  Head and Neck Head-normocephalic, atraumatic with no lesions or palpable masses. Face Global Assessment - atraumatic, no absence of expression. Neck Global Assessment - no abnormal movements, no bruit auscultated on the right, no bruit auscultated on the left, no decreased range of motion, non-tender. Trachea-midline. Thyroid Gland Characteristics - non-tender.  Eye Eyeball - Left-Extraocular movements intact, No Nystagmus. Eyeball - Right-Extraocular movements intact, No Nystagmus. Cornea - Left-No Hazy. Cornea - Right-No Hazy. Sclera/Conjunctiva - Left-No scleral icterus, No Discharge. Sclera/Conjunctiva - Right-No scleral icterus, No Discharge. Pupil - Left-Direct reaction to light normal. Pupil - Right-Direct reaction to light normal.  ENMT Ears Pinna - Left - no drainage observed, no generalized tenderness observed. Right - no drainage  observed, no generalized tenderness observed. Nose and Sinuses External Inspection of the Nose - no destructive lesion observed. Inspection of the nares - Left - quiet respiration. Right - quiet respiration. Mouth and Throat Lips - Upper Lip - no fissures observed, no pallor noted. Lower Lip - no fissures observed, no pallor noted. Nasopharynx - no discharge present. Oral Cavity/Oropharynx - Tongue - no dryness observed. Oral Mucosa - no cyanosis observed. Hypopharynx - no evidence of airway distress observed.  Chest and Lung Exam Inspection Movements - Normal and Symmetrical. Accessory muscles - No use of accessory muscles in breathing. Palpation Palpation of the chest reveals - Non-tender. Auscultation Breath sounds - Normal and Clear.  Cardiovascular Auscultation Rhythm - Regular. Murmurs & Other Heart Sounds - Auscultation of the heart reveals - No Murmurs and No Systolic Clicks.  Abdomen Inspection Inspection of the abdomen reveals - No Visible peristalsis and No Abnormal pulsations. Umbilicus - No Bleeding, No Urine drainage. Palpation/Percussion Palpation and Percussion of the abdomen reveal - Soft, Non Tender, No Rebound tenderness, No Rigidity (guarding) and No Cutaneous hyperesthesia. Note: Abdomen soft. Nontender. Not distended. No umbilical or incisional hernias. No guarding.  Male Genitourinary Sexual Maturity Tanner 5 - Adult hair pattern and Adult penile size and shape. Note: ` ` ` Right groin impulse with cough consistent with inguinal hernia. More subtle impulse on left side suspicious for small indirect inguinal hernia on left side as well.  Otherwise normal external male genitalia circumcised. Cords and testes within normal limits.  Peripheral Vascular Upper Extremity Inspection - Left - No Cyanotic nailbeds, Not Ischemic. Right - No Cyanotic nailbeds, Not Ischemic.  Neurologic Neurologic evaluation reveals -normal attention span and ability to  concentrate, able to name objects and repeat phrases. Appropriate fund of knowledge , normal sensation and normal coordination. Mental Status Affect - not angry, not paranoid. Cranial Nerves-Normal Bilaterally. Gait-Normal.  Neuropsychiatric Mental status exam performed with findings of-able to articulate well with normal speech/language, rate, volume and coherence, thought content normal with ability to perform basic computations and apply abstract reasoning and no evidence of hallucinations, delusions, obsessions or homicidal/suicidal  ideation.  Musculoskeletal Global Assessment Spine, Ribs and Pelvis - no instability, subluxation or laxity. Right Upper Extremity - no instability, subluxation or laxity.  Lymphatic Head & Neck  General Head & Neck Lymphatics: Bilateral - Description - No Localized lymphadenopathy. Axillary  General Axillary Region: Bilateral - Description - No Localized lymphadenopathy. Femoral & Inguinal  Generalized Femoral & Inguinal Lymphatics: Left - Description - No Localized lymphadenopathy. Right - Description - No Localized lymphadenopathy.    Assessment & Plan Adin Hector MD; 07/16/2017 4:14 PM)  BILATERAL INGUINAL HERNIA WITHOUT OBSTRUCTION OR GANGRENE, RECURRENCE NOT SPECIFIED (K40.20) Impression: Definite right and probable left inguinal hernias. I think he would benefit from repair. Laparoscopic underlay with mesh. Okay to continue aspirin perioperatively.  He is quite active and wishes to be able to continue to exercise and stay active, so he is interested in proceeding with surgery. He will check his calendar and call us   PREOP - [place] EXAMINATION FOR GENERAL SURGICAL PROCEDURE (Z01.818)  Current Plans You are being scheduled for surgery- Our schedulers will call you.  You should hear from our office's scheduling department within 5 working days about the location, date, and time of surgery. We  try to make accommodations for patient's preferences in scheduling surgery, but sometimes the OR schedule or the surgeon's schedule prevents [place] from making those accommodations.  If you have not heard from our office [phone]) in 5 working days, call the office and ask for your surgeon's nurse.  If you have other questions about your diagnosis, plan, or surgery, call the office and ask for your surgeon's nurse.  Written instructions provided The anatomy & physiology of the abdominal wall and pelvic floor was discussed. The pathophysiology of hernias in the inguinal and pelvic region was discussed. Natural history risks such as progressive enlargement, pain, incarceration, and strangulation was discussed. Contributors to complications such as smoking, obesity, diabetes, prior surgery, etc were discussed.  I feel the risks of no intervention will lead to serious problems that outweigh the operative risks; therefore, I recommended surgery to reduce and repair the hernia. I explained laparoscopic techniques with possible need for an open approach. I noted usual use of mesh to patch and/or buttress hernia repair  Risks such as bleeding, infection, abscess, need for further treatment, heart attack, death, and other risks were discussed. I noted a good likelihood this will help address the problem. Goals of post-operative recovery were discussed as well. Possibility that this will not correct all symptoms was explained. I stressed the importance of low-impact activity, aggressive pain control, avoiding constipation, & not pushing through pain to minimize risk of post-operative chronic pain or injury. Possibility of reherniation was discussed. We will work to minimize complications.  An educational handout further explaining the pathology & treatment options was given as well. Questions were answered. The patient expresses understanding & wishes to proceed with surgery.  Pt Education -  Pamphlet Given - Laparoscopic Hernia Repair: discussed with patient and provided information. Pt Education - [facility] Pain Control (Nissan Frazzini) Pt Education - [facility] Hernia Post-Op HCI (Eliyas Suddreth): discussed with patient and provided information.  Adin Hector, M.D., F.A.C.S. Gastrointestinal and Minimally Invasive Surgery [facility] [address] [place], [place] [address] [phone] Main / Paging

## 2017-07-18 ENCOUNTER — Encounter: Payer: Self-pay | Admitting: Neurology

## 2017-07-25 DIAGNOSIS — H16223 Keratoconjunctivitis sicca, not specified as Sjogren's, bilateral: Secondary | ICD-10-CM | POA: Diagnosis not present

## 2017-07-25 DIAGNOSIS — H2513 Age-related nuclear cataract, bilateral: Secondary | ICD-10-CM | POA: Diagnosis not present

## 2017-08-06 ENCOUNTER — Encounter: Payer: Self-pay | Admitting: Neurology

## 2017-08-28 ENCOUNTER — Ambulatory Visit: Payer: PPO | Admitting: *Deleted

## 2017-08-28 ENCOUNTER — Encounter: Payer: Self-pay | Admitting: Neurology

## 2017-08-28 ENCOUNTER — Other Ambulatory Visit: Payer: Self-pay

## 2017-08-28 ENCOUNTER — Ambulatory Visit: Payer: PPO | Admitting: Neurology

## 2017-08-28 VITALS — BP 135/65 | HR 71 | Ht 68.0 in | Wt 159.5 lb

## 2017-08-28 DIAGNOSIS — G40209 Localization-related (focal) (partial) symptomatic epilepsy and epileptic syndromes with complex partial seizures, not intractable, without status epilepticus: Secondary | ICD-10-CM | POA: Diagnosis not present

## 2017-08-28 MED ORDER — LAMOTRIGINE 150 MG PO TABS: 150.0000 mg | ORAL_TABLET | Freq: Two times a day (BID) | ORAL | 3 refills | Status: DC

## 2017-08-28 NOTE — Progress Notes (Signed)
Reason for visit: Seizures  Referring physician: Dr. [name] Hunter Vega is a 67 y.o. male  History of present illness:  Hunter Vega is a 67 year old left-handed white male with a history of partial complex seizures with secondary generalization.  The patient last had a seizure in September 2015 while on Keppra.  His original diagnosis was made in 2012.  The patient indicates that some of his seizure events may be staring episodes, he may secondary generalized at times.  He has had a workup that included an MRI of the brain that showed several punctate white matter changes that were nonspecific.  MRA of the head was unremarkable and a carotid Doppler study was unremarkable.  The patient has had an EEG study that reveals left frontal sharp wave activity.  The patient has not had any seizures since being on Lamictal which is very well tolerated.  The patient is back to driving a car at this point.  He indicates that he sleeps well at night, he denies any numbness or weakness of the face, arms, or legs.  He does note some mild gait instability, he has not had any falls.  He denies issues controlling the bowels or the bladder.  He did have blood work done in October 2018, a Lamictal level at that time was 11.0.  The patient denies any family history of seizures, he denies any prior history of head trauma.  He has been followed by Dr. Sabra Heck previously, he is now coming to this office for follow-up for his seizures.   Past Medical History:  Diagnosis Date  . History of seizure   . Hyperlipidemia   . PSVT (paroxysmal supraventricular tachycardia) ([place])   . Sleep apnea     Past Surgical History:  Procedure Laterality Date  . CORONARY STENT PLACEMENT     x2. Mid LAD    History reviewed. No pertinent family history.  Social history:  reports that he has never smoked. He has never used smokeless tobacco. He reports that he does not drink alcohol or use drugs.  Medications:  Prior to  Admission medications   Medication Sig Start Date End Date Taking? Authorizing Provider  aspirin EC 81 MG tablet Take 81 mg by mouth daily.    Yes [provider]  atorvastatin (LIPITOR) 80 MG tablet Take 80 mg by mouth daily.    Yes [provider]  lamoTRIgine (LAMICTAL) 150 MG tablet Take 150 mg by mouth 2 (two) times daily.    Yes [provider]      Allergies  Allergen Reactions  . Penicillins     REACTION: swelling    ROS:  Out of a complete 14 system review of symptoms, the patient complains only of the following symptoms, and all other reviewed systems are negative.  Easy bruising, easy bleeding Memory loss, seizures  Blood pressure 135/65, pulse 71, height 5\' 8"  (1.727 m), weight 159 lb 8 oz (72.3 kg).  Physical Exam  General: The patient is alert and cooperative at the time of the examination.  Eyes: Pupils are equal, round, and reactive to light. Discs are flat bilaterally.  Neck: The neck is supple, no carotid bruits are noted.  Respiratory: The respiratory examination is clear.  Cardiovascular: The cardiovascular examination reveals a regular rate and rhythm, no obvious murmurs or rubs are noted.  Skin: Extremities are without significant edema.  Neurologic Exam  Mental status: The patient is alert and oriented x 3 at the time of the  examination. The patient has apparent normal recent and remote memory, with an apparently normal attention span and concentration ability.  Cranial nerves: Facial symmetry is present. There is good sensation of the face to pinprick and soft touch bilaterally. The strength of the facial muscles and the muscles to head turning and shoulder shrug are normal bilaterally. Speech is well enunciated, no aphasia or dysarthria is noted. Extraocular movements are full. Visual fields are full. The tongue is midline, and the patient has symmetric elevation of the soft palate. No obvious hearing deficits are  noted.  Motor: The motor testing reveals 5 over 5 strength of all 4 extremities. Good symmetric motor tone is noted throughout.  Sensory: Sensory testing is intact to pinprick, soft touch, vibration sensation, and position sense on all 4 extremities. No evidence of extinction is noted.  Coordination: Cerebellar testing reveals good finger-nose-finger and heel-to-shin bilaterally.  Gait and station: Gait is normal. Tandem gait is normal. Romberg is negative. No drift is seen.  Reflexes: Deep tendon reflexes are symmetric and normal bilaterally. Toes are downgoing bilaterally.    MRI brain 01/16/13:  No acute intracranial abnormality..   Multiple punctate T2 hyperintense cerebral white matter foci are nonspecific and typically attributed to remote sequela of ischemia, inflammation, or infection.   EEG 02/05/13:  EEG Interpretation : This is an abnormal awake, drowsy and sleep  routine adult EEG due to occasional left lateral frontal spikes.   Carotid doppler 04/11/13:  Extracranial Carotid System  RIGHT No significant stenotic flow. ICA: Minimal plaque. VA: Antegrade/Forward flow.  LEFT No significant stenotic flow. No Measurable Plaque. VA: Antegrade/Forward flow.   CT head 01/02/11:  IMPRESSION: No acute intracranial findings or mass lesions.  * CT scan images were reviewed online. I agree with the written report.    Assessment/Plan:  1.  Partial complex seizures with secondary generalization  The patient appears to be under good control currently with his Lamictal.  He is tolerating the medication well, a prescription was sent in.  He will follow-up in 1 year, sooner if needed.  Hunter Alexanders MD 08/28/2017 8:24 AM  [facility] [address] [place], [place], [place] [address]  Phone [phone] Fax [phone]

## 2017-09-17 NOTE — Progress Notes (Addendum)
[facility] 08-28-17 Epic  [facility] 06-21-17 Epic CARE EVERYWHERE   EKG. CARDIAC CLEARANCE, AND STRESS TEST REQUEST SENT TO [facility] VIA FAX  .

## 2017-09-17 NOTE — Patient Instructions (Addendum)
Hunter Vega  09/17/2017   Your procedure is scheduled on: 09-26-17   Report to [facility] Main  Entrance    Report to admitting at 6:30AM    Call this number if you have problems the morning of surgery [phone]     PLEASE BING CPAP MASK AND TUBING. DEVICE WILL BE PROVIDED FOR YOU!   Remember: NO SOLID FOOD AFTER MIDNIGHT THE NIGHT PRIOR TO SURGERY. NOTHING BY MOUTH EXCEPT CLEAR LIQUIDS UNTIL 3 HOURS PRIOR TO SCHEDULED SURGERY. PLEASE FINISH ENSURE DRINK PER SURGEON ORDER 3 HOURS PRIOR TO SCHEDULED SURGERY TIME WHICH NEEDS TO BE COMPLETED AT ___5:30AM_________.   CLEAR LIQUID DIET   Foods Allowed                                                                     Foods Excluded  Coffee and tea, regular and decaf                             liquids that you cannot  Plain Jell-O in any flavor                                             see through such as: Fruit ices (not with fruit pulp)                                     milk, soups, orange juice  Iced Popsicles                                    All solid food Carbonated beverages, regular and diet                                    Cranberry, grape and apple juices Sports drinks like Gatorade Lightly seasoned clear broth or consume(fat free) Sugar, honey syrup  Sample Menu Breakfast                                Lunch                                     Supper Cranberry juice                    Beef broth                            Chicken broth Jell-O  Grape juice                           Apple juice Coffee or tea                        Jell-O                                      Popsicle                                                Coffee or tea                        Coffee or tea  _____________________________________________________________________       Take these medicines the morning of surgery with A SIP OF WATER: LAMOTRIGINE(LAMICTAL)                             You may not have any metal on your body including hair pins and              piercings  Do not wear jewelry, make-up, lotions, powders or perfumes, deodorant                    Men may shave face and neck.   Do not bring valuables to the hospital. [place].  Contacts, dentures or bridgework may not be worn into surgery.       Patients discharged the day of surgery will not be allowed to drive home.  Name and phone number of your driver:  Special Instructions: N/A              Please read over the following fact sheets you were given: _____________________________________________________________________             [facility] - Preparing for Surgery Before surgery, you can play an important role.  Because skin is not sterile, your skin needs to be as free of germs as possible.  You can reduce the number of germs on your skin by washing with CHG (chlorahexidine gluconate) soap before surgery.  CHG is an antiseptic cleaner which kills germs and bonds with the skin to continue killing germs even after washing. Please DO NOT use if you have an allergy to CHG or antibacterial soaps.  If your skin becomes reddened/irritated stop using the CHG and inform your nurse when you arrive at Short Stay. Do not shave (including legs and underarms) for at least 48 hours prior to the first CHG shower.  You may shave your face/neck. Please follow these instructions carefully:  1.  Shower with CHG Soap the night before surgery and the  morning of Surgery.  2.  If you choose to wash your hair, wash your hair first as usual with your  normal  shampoo.  3.  After you shampoo, rinse your hair and body thoroughly to remove the  shampoo.  4.  Use CHG as you would any other liquid soap.  You can apply chg directly  to the skin and wash                       Gently with a scrungie or clean washcloth.  5.  Apply the  CHG Soap to your body ONLY FROM THE NECK DOWN.   Do not use on face/ open                           Wound or open sores. Avoid contact with eyes, ears mouth and genitals (private parts).                       Wash face,  Genitals (private parts) with your normal soap.             6.  Wash thoroughly, paying special attention to the area where your surgery  will be performed.  7.  Thoroughly rinse your body with warm water from the neck down.  8.  DO NOT shower/wash with your normal soap after using and rinsing off  the CHG Soap.                9.  Pat yourself dry with a clean towel.            10.  Wear clean pajamas.            11.  Place clean sheets on your bed the night of your first shower and do not  sleep with pets. Day of Surgery : Do not apply any lotions/deodorants the morning of surgery.  Please wear clean clothes to the hospital/surgery center.  FAILURE TO FOLLOW THESE INSTRUCTIONS MAY RESULT IN THE CANCELLATION OF YOUR SURGERY PATIENT SIGNATURE_________________________________  NURSE SIGNATURE__________________________________  ________________________________________________________________________

## 2017-09-17 NOTE — Progress Notes (Signed)
Cardiac clearance received.  Dr Donnetta Hutching 08-01-17 on chart

## 2017-09-18 ENCOUNTER — Encounter (HOSPITAL_COMMUNITY): Payer: Self-pay

## 2017-09-18 ENCOUNTER — Encounter (HOSPITAL_COMMUNITY)
Admission: RE | Admit: 2017-09-18 | Discharge: 2017-09-18 | Disposition: A | Discharge status: Home / Self Care | Payer: PPO | Source: Ambulatory Visit | Attending: Surgery | Admitting: Surgery

## 2017-09-18 ENCOUNTER — Other Ambulatory Visit: Payer: Self-pay

## 2017-09-18 DIAGNOSIS — Z9889 Other specified postprocedural states: Secondary | ICD-10-CM | POA: Diagnosis not present

## 2017-09-18 DIAGNOSIS — K4090 Unilateral inguinal hernia, without obstruction or gangrene, not specified as recurrent: Secondary | ICD-10-CM | POA: Diagnosis not present

## 2017-09-18 DIAGNOSIS — Z01818 Encounter for other preprocedural examination: Secondary | ICD-10-CM | POA: Diagnosis not present

## 2017-09-18 HISTORY — DX: Personal history of other medical treatment: Z92.89

## 2017-09-18 LAB — BASIC METABOLIC PANEL
Anion gap: 9 (ref 5–15)
BUN: 24 mg/dL — ABNORMAL HIGH (ref 6–20)
CO2: 26 mmol/L (ref 22–32)
Calcium: 9.8 mg/dL (ref 8.9–10.3)
Chloride: 106 mmol/L (ref 101–111)
Creatinine, Ser: 1.05 mg/dL (ref 0.61–1.24)
GFR calc Af Amer: >60 mL/min (ref >60)
GFR calc non Af Amer: >60 mL/min (ref >60)
Glucose, Bld: 124 mg/dL — ABNORMAL HIGH (ref 65–99)
Potassium: 4.7 mmol/L (ref 3.5–5.1)
Sodium: 141 mmol/L (ref 135–145)

## 2017-09-18 LAB — CBC
HCT: 41.1 % (ref 39.0–52.0)
Hemoglobin: 13.3 g/dL (ref 13.0–17.0)
MCH: 31.2 pg (ref 26.0–34.0)
MCHC: 32.4 g/dL (ref 30.0–36.0)
MCV: 96.5 fL (ref 78.0–100.0)
Platelets: 182 10*3/uL (ref 150–400)
RBC: 4.26 MIL/uL (ref 4.22–5.81)
RDW: 13.8 % (ref 11.5–15.5)
WBC: 6.3 10*3/uL (ref 4.0–10.5)

## 2017-09-25 MED ORDER — CLINDAMYCIN PHOSPHATE 900 MG/50ML IV SOLN
900.0000 mg | INTRAVENOUS | Status: AC
  Administered 2017-09-26: 900 mg via INTRAVENOUS
  Filled 2017-09-25: qty 50

## 2017-09-25 MED ORDER — GENTAMICIN SULFATE 40 MG/ML IJ SOLN
5.0000 mg/kg | INTRAMUSCULAR | Status: AC
  Administered 2017-09-26: 360 mg via INTRAVENOUS
  Filled 2017-09-25: qty 9

## 2017-09-26 ENCOUNTER — Encounter (HOSPITAL_COMMUNITY): Payer: Self-pay | Admitting: *Deleted

## 2017-09-26 ENCOUNTER — Ambulatory Visit (HOSPITAL_COMMUNITY)
Admission: RE | Admit: 2017-09-26 | Discharge: 2017-09-26 | Disposition: A | Discharge status: Home / Self Care | Payer: PPO | Source: Ambulatory Visit | Attending: Surgery | Admitting: Surgery

## 2017-09-26 ENCOUNTER — Encounter (HOSPITAL_COMMUNITY)
Admission: RE | Disposition: A | Discharge status: Home / Self Care | Payer: Self-pay | Source: Ambulatory Visit | Attending: Surgery

## 2017-09-26 ENCOUNTER — Other Ambulatory Visit: Payer: Self-pay

## 2017-09-26 ENCOUNTER — Ambulatory Visit (HOSPITAL_COMMUNITY): Payer: PPO | Admitting: Certified Registered Nurse Anesthetist

## 2017-09-26 DIAGNOSIS — K4020 Bilateral inguinal hernia, without obstruction or gangrene, not specified as recurrent: Secondary | ICD-10-CM

## 2017-09-26 DIAGNOSIS — G40909 Epilepsy, unspecified, not intractable, without status epilepticus: Secondary | ICD-10-CM | POA: Insufficient documentation

## 2017-09-26 DIAGNOSIS — Z955 Presence of coronary angioplasty implant and graft: Secondary | ICD-10-CM | POA: Diagnosis not present

## 2017-09-26 DIAGNOSIS — I2510 Atherosclerotic heart disease of native coronary artery without angina pectoris: Secondary | ICD-10-CM | POA: Diagnosis not present

## 2017-09-26 DIAGNOSIS — Z79899 Other long term (current) drug therapy: Secondary | ICD-10-CM | POA: Diagnosis not present

## 2017-09-26 DIAGNOSIS — Z7982 Long term (current) use of aspirin: Secondary | ICD-10-CM | POA: Diagnosis not present

## 2017-09-26 DIAGNOSIS — G4739 Other sleep apnea: Secondary | ICD-10-CM | POA: Diagnosis not present

## 2017-09-26 DIAGNOSIS — G4730 Sleep apnea, unspecified: Secondary | ICD-10-CM | POA: Diagnosis not present

## 2017-09-26 DIAGNOSIS — E872 Acidosis: Secondary | ICD-10-CM | POA: Diagnosis not present

## 2017-09-26 HISTORY — PX: INGUINAL HERNIA REPAIR: SHX194

## 2017-09-26 HISTORY — DX: Bilateral inguinal hernia, without obstruction or gangrene, not specified as recurrent: K40.20

## 2017-09-26 HISTORY — PX: INSERTION OF MESH: SHX5868

## 2017-09-26 SURGERY — REPAIR, HERNIA, INGUINAL, BILATERAL, LAPAROSCOPIC
Anesthesia: General | Site: Groin | Laterality: Bilateral

## 2017-09-26 MED ORDER — LIDOCAINE HCL 2 % IJ SOLN
INTRAMUSCULAR | Status: AC
  Filled 2017-09-26: qty 20

## 2017-09-26 MED ORDER — LACTATED RINGERS IR SOLN
Status: DC | PRN
  Administered 2017-09-26: 1000 mL

## 2017-09-26 MED ORDER — CHLORHEXIDINE GLUCONATE CLOTH 2 % EX PADS: 6.0000 | MEDICATED_PAD | Freq: Once | CUTANEOUS | Status: DC

## 2017-09-26 MED ORDER — EPHEDRINE 5 MG/ML INJ
INTRAVENOUS | Status: AC
  Filled 2017-09-26: qty 10

## 2017-09-26 MED ORDER — KETAMINE HCL 10 MG/ML IJ SOLN
INTRAMUSCULAR | Status: DC | PRN
  Administered 2017-09-26: 30 mg via INTRAVENOUS

## 2017-09-26 MED ORDER — STERILE WATER FOR IRRIGATION IR SOLN
Status: DC | PRN
  Administered 2017-09-26: 1000 mL

## 2017-09-26 MED ORDER — HYDROMORPHONE HCL 1 MG/ML IJ SOLN: 0.2500 mg | INTRAMUSCULAR | Status: DC | PRN

## 2017-09-26 MED ORDER — MIDAZOLAM HCL 2 MG/2ML IJ SOLN
INTRAMUSCULAR | Status: AC
  Filled 2017-09-26: qty 2

## 2017-09-26 MED ORDER — ONDANSETRON HCL 4 MG/2ML IJ SOLN
INTRAMUSCULAR | Status: DC | PRN
  Administered 2017-09-26: 4 mg via INTRAVENOUS

## 2017-09-26 MED ORDER — PHENYLEPHRINE 40 MCG/ML (10ML) SYRINGE FOR IV PUSH (FOR BLOOD PRESSURE SUPPORT)
PREFILLED_SYRINGE | INTRAVENOUS | Status: AC
  Filled 2017-09-26: qty 10

## 2017-09-26 MED ORDER — KETAMINE HCL 10 MG/ML IJ SOLN
INTRAMUSCULAR | Status: AC
  Filled 2017-09-26: qty 1

## 2017-09-26 MED ORDER — PHENYLEPHRINE 40 MCG/ML (10ML) SYRINGE FOR IV PUSH (FOR BLOOD PRESSURE SUPPORT)
PREFILLED_SYRINGE | INTRAVENOUS | Status: DC | PRN
  Administered 2017-09-26: 80 ug via INTRAVENOUS

## 2017-09-26 MED ORDER — MIDAZOLAM HCL 5 MG/5ML IJ SOLN
INTRAMUSCULAR | Status: DC | PRN
  Administered 2017-09-26: 1 mg via INTRAVENOUS

## 2017-09-26 MED ORDER — SUCCINYLCHOLINE CHLORIDE 200 MG/10ML IV SOSY
PREFILLED_SYRINGE | INTRAVENOUS | Status: AC
  Filled 2017-09-26: qty 10

## 2017-09-26 MED ORDER — SCOPOLAMINE 1 MG/3DAYS TD PT72
MEDICATED_PATCH | TRANSDERMAL | Status: DC | PRN
  Administered 2017-09-26: 1 via TRANSDERMAL

## 2017-09-26 MED ORDER — SCOPOLAMINE 1 MG/3DAYS TD PT72
MEDICATED_PATCH | TRANSDERMAL | Status: AC
  Filled 2017-09-26: qty 1

## 2017-09-26 MED ORDER — DEXAMETHASONE SODIUM PHOSPHATE 10 MG/ML IJ SOLN
INTRAMUSCULAR | Status: DC | PRN
  Administered 2017-09-26: 7 mg via INTRAVENOUS

## 2017-09-26 MED ORDER — EPHEDRINE SULFATE-NACL 50-0.9 MG/10ML-% IV SOSY
PREFILLED_SYRINGE | INTRAVENOUS | Status: DC | PRN
  Administered 2017-09-26 (×5): 10 mg via INTRAVENOUS

## 2017-09-26 MED ORDER — PROPOFOL 10 MG/ML IV BOLUS
INTRAVENOUS | Status: DC | PRN
  Administered 2017-09-26: 200 mg via INTRAVENOUS

## 2017-09-26 MED ORDER — ACETAMINOPHEN 500 MG PO TABS
1000.0000 mg | ORAL_TABLET | ORAL | Status: AC
  Administered 2017-09-26: 1000 mg via ORAL
  Filled 2017-09-26: qty 2

## 2017-09-26 MED ORDER — SUCCINYLCHOLINE CHLORIDE 200 MG/10ML IV SOSY
PREFILLED_SYRINGE | INTRAVENOUS | Status: DC | PRN
  Administered 2017-09-26: 100 mg via INTRAVENOUS

## 2017-09-26 MED ORDER — SUGAMMADEX SODIUM 200 MG/2ML IV SOLN
INTRAVENOUS | Status: DC | PRN
  Administered 2017-09-26: 200 mg via INTRAVENOUS

## 2017-09-26 MED ORDER — DEXAMETHASONE SODIUM PHOSPHATE 10 MG/ML IJ SOLN
INTRAMUSCULAR | Status: AC
  Filled 2017-09-26: qty 1

## 2017-09-26 MED ORDER — FENTANYL CITRATE (PF) 250 MCG/5ML IJ SOLN
INTRAMUSCULAR | Status: AC
  Filled 2017-09-26: qty 5

## 2017-09-26 MED ORDER — PROPOFOL 10 MG/ML IV BOLUS
INTRAVENOUS | Status: AC
  Filled 2017-09-26: qty 20

## 2017-09-26 MED ORDER — GABAPENTIN 300 MG PO CAPS
300.0000 mg | ORAL_CAPSULE | ORAL | Status: AC
  Administered 2017-09-26: 300 mg via ORAL
  Filled 2017-09-26: qty 1

## 2017-09-26 MED ORDER — ROCURONIUM BROMIDE 10 MG/ML (PF) SYRINGE
PREFILLED_SYRINGE | INTRAVENOUS | Status: AC
  Filled 2017-09-26: qty 5

## 2017-09-26 MED ORDER — 0.9 % SODIUM CHLORIDE (POUR BTL) OPTIME
TOPICAL | Status: DC | PRN
  Administered 2017-09-26: 1000 mL

## 2017-09-26 MED ORDER — LIDOCAINE 2% (20 MG/ML) 5 ML SYRINGE
INTRAMUSCULAR | Status: AC
  Filled 2017-09-26: qty 5

## 2017-09-26 MED ORDER — ONDANSETRON HCL 4 MG/2ML IJ SOLN
INTRAMUSCULAR | Status: AC
  Filled 2017-09-26: qty 2

## 2017-09-26 MED ORDER — TRAMADOL HCL 50 MG PO TABS: 50.0000 mg | ORAL_TABLET | Freq: Four times a day (QID) | ORAL | 0 refills | Status: DC | PRN

## 2017-09-26 MED ORDER — BUPIVACAINE HCL (PF) 0.25 % IJ SOLN
INTRAMUSCULAR | Status: DC | PRN
  Administered 2017-09-26: 60 mL

## 2017-09-26 MED ORDER — FENTANYL CITRATE (PF) 250 MCG/5ML IJ SOLN
INTRAMUSCULAR | Status: DC | PRN
  Administered 2017-09-26 (×2): 50 ug via INTRAVENOUS

## 2017-09-26 MED ORDER — SUGAMMADEX SODIUM 200 MG/2ML IV SOLN
INTRAVENOUS | Status: AC
  Filled 2017-09-26: qty 2

## 2017-09-26 MED ORDER — ROCURONIUM BROMIDE 50 MG/5ML IV SOSY
PREFILLED_SYRINGE | INTRAVENOUS | Status: DC | PRN
  Administered 2017-09-26: 40 mg via INTRAVENOUS

## 2017-09-26 MED ORDER — LIDOCAINE 2% (20 MG/ML) 5 ML SYRINGE
INTRAMUSCULAR | Status: DC | PRN
  Administered 2017-09-26: 60 mg via INTRAVENOUS

## 2017-09-26 MED ORDER — LACTATED RINGERS IV SOLN
INTRAVENOUS | Status: DC
  Administered 2017-09-26: 07:00:00 via INTRAVENOUS

## 2017-09-26 MED ORDER — BUPIVACAINE HCL (PF) 0.25 % IJ SOLN
INTRAMUSCULAR | Status: AC
  Filled 2017-09-26: qty 60

## 2017-09-26 SURGICAL SUPPLY — 34 items
CABLE HIGH FREQUENCY MONO STRZ (ELECTRODE) ×2 IMPLANT
CHLORAPREP W/TINT 26ML (MISCELLANEOUS) ×2 IMPLANT
COVER SURGICAL LIGHT HANDLE (MISCELLANEOUS) ×2 IMPLANT
DECANTER SPIKE VIAL GLASS SM (MISCELLANEOUS) ×2 IMPLANT
DEVICE SECURE STRAP 25 ABSORB (INSTRUMENTS) IMPLANT
DRAPE WARM FLUID 44X44 (DRAPE) ×2 IMPLANT
DRSG TEGADERM 2-3/8X2-3/4 SM (GAUZE/BANDAGES/DRESSINGS) ×2 IMPLANT
DRSG TEGADERM 4X4.75 (GAUZE/BANDAGES/DRESSINGS) ×2 IMPLANT
ELECT REM PT RETURN 15FT ADLT (MISCELLANEOUS) ×2 IMPLANT
GAUZE SPONGE 2X2 8PLY STRL LF (GAUZE/BANDAGES/DRESSINGS) ×1 IMPLANT
GLOVE ECLIPSE 8.0 STRL XLNG CF (GLOVE) ×2 IMPLANT
GLOVE INDICATOR 8.0 STRL GRN (GLOVE) ×2 IMPLANT
GOWN STRL REUS W/TWL XL LVL3 (GOWN DISPOSABLE) ×4 IMPLANT
IRRIG SUCT STRYKERFLOW 2 WTIP (MISCELLANEOUS)
IRRIGATION SUCT STRKRFLW 2 WTP (MISCELLANEOUS) IMPLANT
KIT BASIN OR (CUSTOM PROCEDURE TRAY) ×2 IMPLANT
MESH ULTRAPRO 6X6 15CM15CM (Mesh General) ×4 IMPLANT
NEEDLE INSUFFLATION 14GA 120MM (NEEDLE) IMPLANT
PAD POSITIONING PINK XL (MISCELLANEOUS) ×2 IMPLANT
SCISSORS LAP 5X35 DISP (ENDOMECHANICALS) ×2 IMPLANT
SLEEVE ADV FIXATION 5X100MM (TROCAR) ×2 IMPLANT
SPONGE GAUZE 2X2 STER 10/PKG (GAUZE/BANDAGES/DRESSINGS) ×1
SUT MNCRL AB 4-0 PS2 18 (SUTURE) ×2 IMPLANT
SUT PDS AB 1 CT1 27 (SUTURE) ×2 IMPLANT
SUT VIC AB 2-0 SH 27 (SUTURE)
SUT VIC AB 2-0 SH 27X BRD (SUTURE) IMPLANT
SUT VICRYL 0 UR6 27IN ABS (SUTURE) ×2 IMPLANT
TACKER 5MM HERNIA 3.5CML NAB (ENDOMECHANICALS) IMPLANT
TOWEL OR 17X26 10 PK STRL BLUE (TOWEL DISPOSABLE) ×2 IMPLANT
TOWEL OR NON WOVEN STRL DISP B (DISPOSABLE) ×2 IMPLANT
TRAY LAPAROSCOPIC (CUSTOM PROCEDURE TRAY) ×2 IMPLANT
TROCAR ADV FIXATION 5X100MM (TROCAR) ×2 IMPLANT
TROCAR XCEL BLUNT TIP 100MML (ENDOMECHANICALS) ×2 IMPLANT
TUBING INSUF HEATED (TUBING) ×2 IMPLANT

## 2017-09-26 NOTE — Interval H&P Note (Signed)
History and Physical Interval Note:  09/26/2017 8:20 AM  Hunter Vega  has presented today for surgery, with the diagnosis of Bilateral inguinal hernias  The various methods of treatment have been discussed with the patient and family. After consideration of risks, benefits and other options for treatment, the patient has consented to  Procedure(s): [place] (Bilateral) INSERTION OF MESH (Bilateral) as a surgical intervention .  The patient's history has been reviewed, patient examined, no change in status, stable for surgery.  I have reviewed the patient's chart and labs.  Questions were answered to the patient's satisfaction.     Adin Hector

## 2017-09-26 NOTE — Progress Notes (Addendum)
PACU Phase II,  Pt unable to void at this time, reports feeling the urgency, attempted yet no urine output. Paged Dr Johney Maine. Bladder scan showing 87 ml urine .   Per tracy, RN , per Dr Johney Maine in/out cath is ordered .    In and out  Straight cath done , 300 ml urine output, pt will be discharged home , denies pain .

## 2017-09-26 NOTE — Anesthesia Procedure Notes (Signed)
Procedure Name: Intubation Date/Time: 09/26/2017 8:47 AM Performed by: Maxwell Caul, CRNA Pre-anesthesia Checklist: Patient identified, Emergency Drugs available, Suction available and Patient being monitored Patient Re-evaluated:Patient Re-evaluated prior to induction Oxygen Delivery Method: Circle system utilized Preoxygenation: Pre-oxygenation with 100% oxygen Induction Type: IV induction Ventilation: Mask ventilation without difficulty Laryngoscope Size: Mac and 4 Grade View: Grade II Tube type: Oral Tube size: 7.5 mm Number of attempts: 1 Airway Equipment and Method: Stylet and Oral airway Placement Confirmation: ETT inserted through vocal cords under direct vision,  positive ETCO2 and breath sounds checked- equal and bilateral Secured at: 22 cm Tube secured with: Tape Dental Injury: Teeth and Oropharynx as per pre-operative assessment

## 2017-09-26 NOTE — Anesthesia Preprocedure Evaluation (Signed)
Anesthesia Evaluation  Patient identified by MRN, date of birth, ID band Patient awake    Reviewed: Allergy & Precautions, NPO status , Patient's Chart, lab work & pertinent test results  Airway Mallampati: II  TM Distance: >3 FB     Dental   Pulmonary sleep apnea ,    breath sounds clear to auscultation       Cardiovascular + CAD   Rhythm:Regular Rate:Normal     Neuro/Psych Seizures -,     GI/Hepatic negative GI ROS, Neg liver ROS,   Endo/Other  negative endocrine ROS  Renal/GU negative Renal ROS     Musculoskeletal   Abdominal   Peds  Hematology   Anesthesia Other Findings   Reproductive/Obstetrics                             Anesthesia Physical Anesthesia Plan  ASA: III  Anesthesia Plan: General   Post-op Pain Management:    Induction:   PONV Risk Score and Plan: 2 and Treatment may vary due to age or medical condition, Ondansetron, Dexamethasone and Midazolam  Airway Management Planned:   Additional Equipment:   Intra-op Plan:   Post-operative Plan: Extubation in OR  Informed Consent: I have reviewed the patients History and Physical, chart, labs and discussed the procedure including the risks, benefits and alternatives for the proposed anesthesia with the patient or authorized representative who has indicated his/her understanding and acceptance.   Dental advisory given  Plan Discussed with: CRNA and Anesthesiologist  Anesthesia Plan Comments:         Anesthesia Quick Evaluation

## 2017-09-26 NOTE — Discharge Instructions (Signed)
HERNIA REPAIR: POST OP INSTRUCTIONS ° °###################################################################### ° °EAT °Gradually transition to a high fiber diet with a fiber supplement over the next few weeks after discharge.  Start with a pureed / full liquid diet (see below) ° °WALK °Walk an hour a day.  Control your pain to do that.   ° °CONTROL PAIN °Control pain so that you can walk, sleep, tolerate sneezing/coughing, go up/down stairs. ° °HAVE A BOWEL MOVEMENT DAILY °Keep your bowels regular to avoid problems.  OK to try a laxative to override constipation.  OK to use an antidairrheal to slow down diarrhea.  Call if not better after 2 tries ° °CALL IF YOU HAVE PROBLEMS/CONCERNS °Call if you are still struggling despite following these instructions. °Call if you have concerns not answered by these instructions ° °###################################################################### ° ° ° °1. DIET: Follow a light bland diet the first 24 hours after arrival home, such as soup, liquids, crackers, etc.  Be sure to include lots of fluids daily.  Avoid fast food or heavy meals as your are more likely to get nauseated.  Eat a low fat the next few days after surgery. °2. Take your usually prescribed home medications unless otherwise directed. °3. PAIN CONTROL: °a. Pain is best controlled by a usual combination of three different methods TOGETHER: °i. Ice/Heat °ii. Over the counter pain medication °iii. Prescription pain medication °b. Most patients will experience some swelling and bruising around the hernia(s) such as the bellybutton, groins, or old incisions.  Ice packs or heating pads (30-60 minutes up to 6 times a day) will help. Use ice for the first few days to help decrease swelling and bruising, then switch to heat to help relax tight/sore spots and speed recovery.  Some people prefer to use ice alone, heat alone, alternating between ice & heat.  Experiment to what works for you.  Swelling and bruising can take  several weeks to resolve.   °c. It is helpful to take an over-the-counter pain medication regularly for the first few weeks.  Choose one of the following that works best for you: °i. Naproxen (Aleve, etc)  Two 220mg tabs twice a day °ii. Ibuprofen (Advil, etc) Three 200mg tabs four times a day (every meal & bedtime) °iii. Acetaminophen (Tylenol, etc) 325-650mg four times a day (every meal & bedtime) °d. A  prescription for pain medication should be given to you upon discharge.  Take your pain medication as prescribed.  °i. If you are having problems/concerns with the prescription medicine (does not control pain, nausea, vomiting, rash, itching, etc), please call us [phone] to see if we need to switch you to a different pain medicine that will work better for you and/or control your side effect better. °ii. If you need a refill on your pain medication, please contact your pharmacy.  They will contact our office to request authorization. Prescriptions will not be filled after 5 pm or on week-ends. °4. Avoid getting constipated.  Between the surgery and the pain medications, it is common to experience some constipation.  Increasing fluid intake and taking a fiber supplement (such as Metamucil, Citrucel, FiberCon, MiraLax, etc) 1-2 times a day regularly will usually help prevent this problem from occurring.  A mild laxative (prune juice, Milk of Magnesia, MiraLax, etc) should be taken according to package directions if there are no bowel movements after 48 hours.   °5. Wash / shower every day.  You may shower over the dressings as they are waterproof.   °6. Remove   your waterproof bandages 5 days after surgery.  You may leave the incision open to air.  You may replace a dressing/Band-Aid to cover the incision for comfort if you wish.  Continue to shower over incision(s) after the dressing is off. ° ° ° °7. ACTIVITIES as tolerated:   °a. You may resume regular (light) daily activities beginning the next day--such  as daily self-care, walking, climbing stairs--gradually increasing activities as tolerated.  If you can walk 30 minutes without difficulty, it is safe to try more intense activity such as jogging, treadmill, bicycling, low-impact aerobics, swimming, etc. °b. Save the most intensive and strenuous activity for last such as sit-ups, heavy lifting, contact sports, etc  Refrain from any heavy lifting or straining until you are off narcotics for pain control.   °c. DO NOT PUSH THROUGH PAIN.  Let pain be your guide: If it hurts to do something, don't do it.  Pain is your body warning you to avoid that activity for another week until the pain goes down. °d. You may drive when you are no longer taking prescription pain medication, you can comfortably wear a seatbelt, and you can safely maneuver your car and apply brakes. °e. You may have sexual intercourse when it is comfortable.  °8. FOLLOW UP in our office °a. Please call [facility] at [phone] to set up an appointment to see your surgeon in the office for a follow-up appointment approximately 2-3 weeks after your surgery. °b. Make sure that you call for this appointment the day you arrive home to insure a convenient appointment time. °9.  IF YOU HAVE DISABILITY OR FAMILY LEAVE FORMS, BRING THEM TO THE OFFICE FOR PROCESSING.  DO NOT GIVE THEM TO YOUR DOCTOR. ° °WHEN TO CALL US [phone]: °1. Poor pain control °2. Reactions / problems with new medications (rash/itching, nausea, etc)  °3. Fever over 101.5 F (38.5 C) °4. Inability to urinate °5. Nausea and/or vomiting °6. Worsening swelling or bruising °7. Continued bleeding from incision. °8. Increased pain, redness, or drainage from the incision ° ° The clinic staff is available to answer your questions during regular business hours (8:30am-5pm).  Please don’t hesitate to call and ask to speak to one of our nurses for clinical concerns.  ° If you have a medical emergency, go to the nearest emergency room or call  911. ° A surgeon from [facility] is always on call at the hospitals in [place] ° °[facility] °[address], [address], [place], [place]  [address] ? ° [address] [address], [place], [place]   [address] °MAIN: [phone] ? TOLL FREE: [phone] ? FAX: [phone] °[email or web address] ° ° °

## 2017-09-26 NOTE — Anesthesia Postprocedure Evaluation (Signed)
Anesthesia Post Note  Patient: Hunter Vega  Procedure(s) Performed: LAPAROSCOPIC BILATERAL INGUINAL HERNIA REPAIR (Bilateral Groin) INSERTION OF MESH (Bilateral Groin)     Patient location during evaluation: PACU Anesthesia Type: General Level of consciousness: awake Pain management: pain level controlled Vital Signs Assessment: post-procedure vital signs reviewed and stable Respiratory status: spontaneous breathing Cardiovascular status: stable Anesthetic complications: no    Last Vitals:  Vitals:   09/26/17 1110 09/26/17 1241  BP: 125/72 114/62  Pulse: 73 64  Resp: 16 16  Temp:    SpO2: 100% 100%    Last Pain:  Vitals:   09/26/17 1241  TempSrc:   PainSc: 0-No pain                 Rozelle Caudle

## 2017-09-26 NOTE — Transfer of Care (Signed)
Immediate Anesthesia Transfer of Care Note  Patient: Hunter Vega  Procedure(s) Performed: LAPAROSCOPIC BILATERAL INGUINAL HERNIA REPAIR (Bilateral Groin) INSERTION OF MESH (Bilateral Groin)  Patient Location: PACU  Anesthesia Type:General  Level of Consciousness: awake, alert  and oriented  Airway & Oxygen Therapy: Patient Spontanous Breathing and Patient connected to face mask oxygen  Post-op Assessment: Report given to RN and Post -op Vital signs reviewed and stable  Post vital signs: Reviewed and stable  Last Vitals:  Vitals Value Taken Time  BP 144/77 09/26/2017 10:26 AM  Temp    Pulse 78 09/26/2017 10:29 AM  Resp    SpO2 100 % 09/26/2017 10:29 AM  Vitals shown include unvalidated device data.  Last Pain:  Vitals:   09/26/17 1026  TempSrc:   PainSc: (P) 0-No pain      Patients Stated Pain Goal: 4 (23/55/73 2202)  Complications: No apparent anesthesia complications

## 2017-09-26 NOTE — Op Note (Signed)
09/26/2017  10:18 AM  PATIENT:  Hunter Vega  67 y.o. male  Patient Care Team: Saguier, Iris Pert as PCP - General (Internal Medicine) Michael Boston, MD as Consulting Physician (General Surgery) Kathlen Brunswick., MD as Referring Physician (Cardiology) Kathrynn Ducking, MD as Consulting Physician (Neurology)  PRE-OPERATIVE DIAGNOSIS:  Bilateral inguinal hernias  POST-OPERATIVE DIAGNOSIS:  Bilateral inguinal hernias  PROCEDURE:  Procedure(s): LAPAROSCOPIC BILATERAL INGUINAL HERNIA REPAIR INSERTION OF MESH  SURGEON:  Adin Hector, MD  ASSISTANT: Brenton Grills, PA-S, [employer]   ANESTHESIA:     Regional ilioinguinal and genitofemoral and spermatic cord nerve blocks  General  EBL:  Total I/O In: -  Out: 10 [Blood:10].  See anesthesia record  Delay start of Pharmacological VTE agent (>24hrs) due to surgical blood loss or risk of bleeding:  no  DRAINS: NONE  SPECIMEN:  NONE  DISPOSITION OF SPECIMEN:  N/A  COUNTS:  YES  PLAN OF CARE: Discharge to home after PACU  PATIENT DISPOSITION:  PACU - hemodynamically stable.  INDICATION: Pleasant male with symptomatic right inguinal hernia and probable left inguinal hernia noted on exam.  I offered laparoscopic exploration and repair of hernias found.  The anatomy & physiology of the abdominal wall and pelvic floor was discussed.  The pathophysiology of hernias in the inguinal and pelvic region was discussed.  Natural history risks such as progressive enlargement, pain, incarceration & strangulation was discussed.   Contributors to complications such as smoking, obesity, diabetes, prior surgery, etc were discussed.    I feel the risks of no intervention will lead to serious problems that outweigh the operative risks; therefore, I recommended surgery to reduce and repair the hernia.  I explained laparoscopic techniques with possible need for an open approach.  I noted usual use of mesh to patch and/or buttress hernia  repair  Risks such as bleeding, infection, abscess, need for further treatment, heart attack, death, and other risks were discussed.  I noted a good likelihood this will help address the problem.   Goals of post-operative recovery were discussed as well.  Possibility that this will not correct all symptoms was explained.  I stressed the importance of low-impact activity, aggressive pain control, avoiding constipation, & not pushing through pain to minimize risk of post-operative chronic pain or injury. Possibility of reherniation was discussed.  We will work to minimize complications.     An educational handout further explaining the pathology & treatment options was given as well.  Questions were answered.  The patient expresses understanding & wishes to proceed with surgery.  OR FINDINGS: Bilateral indirect inguinal hernias with moderate-sized spermatic cord lipomas.  No direct space, femoral, obturator hernias.  DESCRIPTION:  The patient was identified & brought into the operating room. The patient was positioned supine with arms tucked. SCDs were active during the entire case. The patient underwent general anesthesia without any difficulty.  The abdomen was prepped and draped in a sterile fashion. The patient's bladder was emptied.  A Surgical Timeout confirmed our plan.  I made a transverse incision through the inferior umbilical fold.  I made a small transverse nick through the anterior rectus fascia contralateral to the inguinal hernia side and placed a 0-vicryl stitch through the fascia.  I placed a Hasson trocar into the preperitoneal plane.  Entry was clean.  We induced carbon dioxide insufflation. Camera inspection revealed no injury.  I used a 69mm angled scope to bluntly free the peritoneum off the infraumbilical anterior abdominal wall.  I  created enough of a preperitoneal pocket to place 35mm ports into the right & left mid-abdomen into this preperitoneal cavity.  I focused attention on  the RIGHT pelvis since that was the dominant hernia side.   I used blunt & focused sharp dissection to free the peritoneum off the flank and down to the pubic rim.  I freed the anteriolateral bladder wall off the anteriolateral pelvic wall, sparing midline attachments.   I located a swath of peritoneum going into a hernia fascial defect at the  internal ring consistent with  an indirect inguinal hernia..  I gradually freed the peritoneal hernia sac off safely and reduced it into the preperitoneal space.  I freed the peritoneum off the spermatic vessels & vas deferens.  I freed peritoneum off the retroperitoneum along the psoas muscle.  Spermatic cord lipoma was dissected away & removed.  I checked & assured hemostasis.     I turned attention on the opposite  LEFT pelvis.  I did dissection in a similar, mirror-image fashion. The patient had an indirect inguinal hernia.Marland Kitchen   Spermatic cord lipomas were dissected away & removed.    I checked & assured hemostasis.     I chose 15x15 cm sheets of ultra-lightweight polypropylene mesh (Ultrapro), one for each side.  I cut a single sigmoid-shaped slit ~6cm from a corner of each mesh.  I placed the meshes into the preperitoneal space & laid them as overlapping diamonds such that at the inferior points, a 6x6 cm corner flap rested in the true anterolateral pelvis, covering the obturator & femoral foramina.   I allowed the bladder to return to the pubis, this helping tuck the corners of the mesh in the anteriolateral pelvis.  The medial corners overlapped each other across midline cephalad to the pubic rim.   This provided >2 inch coverage around the hernias. Because the defects well covered and not particularly large, I did not place any tacks.   I held the hernia sacs cephalad & evacuated carbon dioxide.  I closed the fascia with absorbable suture.  I closed the skin using 4-0 monocryl stitch.  Sterile dressings were applied.   The patient was extubated & arrived in the  PACU in stable condition..  I had discussed postoperative care with the patient in the holding area.  Instructions are written in the chart.  I discussed operative findings, updated the patient's status, discussed probable steps to recovery, and gave postoperative recommendations to the patient's spouse.  Recommendations were made.  Questions were answered.  She expressed understanding & appreciation.   Adin Hector, M.D., F.A.C.S. Gastrointestinal and Minimally Invasive Surgery [facility] [address], [place], [place] [address] [phone] Main / Paging  09/26/2017 10:18 AM

## 2017-09-26 NOTE — H&P (Signed)
Lisabeth Pick Documented: 07/16/2017 3:06 PM Location: [facility] Patient #: [id number] DOB: [birth year] Married / Language: English / Race: White Male  Patient Care Team: Saguier, Iris Pert as PCP - General (Internal Medicine) Michael Boston, MD as Consulting Physician (General Surgery) Kathlen Brunswick., MD as Referring Physician (Cardiology) Kathrynn Ducking, MD as Consulting Physician (Neurology)   Patient sent for surgical consultation at the request of [name], PA  Chief Complaint: Right groin pain. Probable early inguinal hernia  The patient is a pleasant active male. History of coronary stenting for atherosclerotic disease in 2012 at [facility]. Now just on baby aspirin. He exercises regularly. Plays racquetball. Does free weights. 1 of the free weights was falling out of his hand. When he stooped back to grab it he felt a sharp groin pain on the right side. Is been a persistent recurring pain. Worse with bending and lifting. It concerned him. Went to primary care office. Inguinal hernia suspected. Surgical consultation requested.  Patient moves his bowels about twice a week. Takes prunes occasionally. Gets up to urinate once a night but otherwise no problems with starting stream. No BPH. He does not smoke. No history of infection. No prior surgeries.   (Review of systems as stated in this history (HPI) or in the review of systems. Otherwise all other 12 point ROS are negative)   Past Surgical History Santhosh Gulino, [place]; 07/16/2017 3:07 PM) Vasectomy  Diagnostic Studies History Dmoni Fortson, [place]; 07/16/2017 3:07 PM) Colonoscopy 5-10 years ago  Allergies Crystian Frith, CMA; 07/16/2017 3:08 PM) Penicillins  Medication History Nicko Daher, CMA; 07/16/2017 3:10 PM) LamoTRIgine (150MG  Tablet, Oral) Active. Aspirin EC (81MG  Tablet DR, Oral) Active. Lipitor (80MG  Tablet, Oral) Active. Medications  Reconciled  Social History Khalid Lacko, [place]; 07/16/2017 3:07 PM) Alcohol use Occasional alcohol use. Caffeine use Coffee. No drug use Tobacco use Never smoker.  Family History Errin Chewning, [place]; 07/16/2017 3:07 PM) Depression Mother. Heart Disease Father. Migraine Headache Sister.  Other Problems Zuri Bradway, CMA; 07/16/2017 3:07 PM) Back Pain Other disease, cancer, significant illness Seizure Disorder     Review of Systems Harrold Fitchett CMA; 07/16/2017 3:07 PM) General Not Present- Appetite Loss, Chills, Fatigue, Fever, Night Sweats, Weight Gain and Weight Loss. Skin Not Present- Change in Wart/Mole, Dryness, Hives, Jaundice, New Lesions, Non-Healing Wounds, Rash and Ulcer. HEENT Not Present- Earache, Hearing Loss, Hoarseness, Nose Bleed, Oral Ulcers, Ringing in the Ears, Seasonal Allergies, Sinus Pain, Sore Throat, Visual Disturbances, Wears glasses/contact lenses and Yellow Eyes. Respiratory Present- Snoring. Not Present- Bloody sputum, Chronic Cough, Difficulty Breathing and Wheezing. Breast Not Present- Breast Mass, Breast Pain, Nipple Discharge and Skin Changes. Cardiovascular Not Present- Chest Pain, Difficulty Breathing Lying Down, Leg Cramps, Palpitations, Rapid Heart Rate, Shortness of Breath and Swelling of Extremities. Gastrointestinal Not Present- Abdominal Pain, Bloating, Bloody Stool, Change in Bowel Habits, Chronic diarrhea, Constipation, Difficulty Swallowing, Excessive gas, Gets full quickly at meals, Hemorrhoids, Indigestion, Nausea, Rectal Pain and Vomiting. Male Genitourinary Not Present- Blood in Urine, Change in Urinary Stream, Frequency, Impotence, Nocturia, Painful Urination, Urgency and Urine Leakage. Musculoskeletal Present- Back Pain. Not Present- Joint Pain, Joint Stiffness, Muscle Pain, Muscle Weakness and Swelling of Extremities. Neurological Present- Seizures. Not Present- Decreased Memory, Fainting, Headaches, Numbness,  Tingling, Tremor, Trouble walking and Weakness. Psychiatric Not Present- Anxiety, Bipolar, Change in Sleep Pattern, Depression, Fearful and Frequent crying. Endocrine Not Present- Cold Intolerance, Excessive Hunger, Hair Changes, Heat Intolerance, Hot flashes and New Diabetes. Hematology Present- Blood Thinners and Easy  Bruising. Not Present- Excessive bleeding, Gland problems, HIV and Persistent Infections.  Vitals Michae Grimley CMA; 07/16/2017 3:11 PM) 07/16/2017 3:10 PM Weight: 161.8 lb Height: 68in Body Surface Area: 1.87 m Body Mass Index: 24.6 kg/m  Temp.: 98.16F  Pulse: 88 (Regular)  BP: 130/80 (Sitting, Left Arm, Standard)    BP 134/87   Pulse 72   Temp 98.9 F (37.2 C) (Oral)   Resp 16   Ht 5\' 8"  (1.727 m)   Wt 157 lb (71.2 kg)   SpO2 100%   BMI 23.87 kg/m    Physical Exam  General Mental Status-Alert. General Appearance-Not in acute distress, Not Sickly. Orientation-Oriented X3. Hydration-Well hydrated. Voice-Normal.  Integumentary Global Assessment Upon inspection and palpation of skin surfaces of the - Axillae: non-tender, no inflammation or ulceration, no drainage. and Distribution of scalp and body hair is normal. General Characteristics Temperature - normal warmth is noted.  Head and Neck Head-normocephalic, atraumatic with no lesions or palpable masses. Face Global Assessment - atraumatic, no absence of expression. Neck Global Assessment - no abnormal movements, no bruit auscultated on the right, no bruit auscultated on the left, no decreased range of motion, non-tender. Trachea-midline. Thyroid Gland Characteristics - non-tender.  Eye Eyeball - Left-Extraocular movements intact, No Nystagmus. Eyeball - Right-Extraocular movements intact, No Nystagmus. Cornea - Left-No Hazy. Cornea - Right-No Hazy. Sclera/Conjunctiva - Left-No scleral icterus, No Discharge. Sclera/Conjunctiva - Right-No scleral  icterus, No Discharge. Pupil - Left-Direct reaction to light normal. Pupil - Right-Direct reaction to light normal.  ENMT Ears Pinna - Left - no drainage observed, no generalized tenderness observed. Right - no drainage observed, no generalized tenderness observed. Nose and Sinuses External Inspection of the Nose - no destructive lesion observed. Inspection of the nares - Left - quiet respiration. Right - quiet respiration. Mouth and Throat Lips - Upper Lip - no fissures observed, no pallor noted. Lower Lip - no fissures observed, no pallor noted. Nasopharynx - no discharge present. Oral Cavity/Oropharynx - Tongue - no dryness observed. Oral Mucosa - no cyanosis observed. Hypopharynx - no evidence of airway distress observed.  Chest and Lung Exam Inspection Movements - Normal and Symmetrical. Accessory muscles - No use of accessory muscles in breathing. Palpation Palpation of the chest reveals - Non-tender. Auscultation Breath sounds - Normal and Clear.  Cardiovascular Auscultation Rhythm - Regular. Murmurs & Other Heart Sounds - Auscultation of the heart reveals - No Murmurs and No Systolic Clicks.  Abdomen Inspection Inspection of the abdomen reveals - No Visible peristalsis and No Abnormal pulsations. Umbilicus - No Bleeding, No Urine drainage. Palpation/Percussion Palpation and Percussion of the abdomen reveal - Soft, Non Tender, No Rebound tenderness, No Rigidity (guarding) and No Cutaneous hyperesthesia. Note: Abdomen soft. Nontender. Not distended. No umbilical or incisional hernias. No guarding.  Male Genitourinary Sexual Maturity Tanner 5 - Adult hair pattern and Adult penile size and shape. Note: ` ` ` Right groin impulse with cough consistent with inguinal hernia. More subtle impulse on left side suspicious for small indirect inguinal hernia on left side as well.  Otherwise normal external male genitalia circumcised. Cords and testes within normal  limits.  Peripheral Vascular Upper Extremity Inspection - Left - No Cyanotic nailbeds, Not Ischemic. Right - No Cyanotic nailbeds, Not Ischemic.  Neurologic Neurologic evaluation reveals -normal attention span and ability to concentrate, able to name objects and repeat phrases. Appropriate fund of knowledge , normal sensation and normal coordination. Mental Status Affect - not angry, not paranoid. Cranial Nerves-Normal Bilaterally. Gait-Normal.  Neuropsychiatric Mental status exam performed with findings of-able to articulate well with normal speech/language, rate, volume and coherence, thought content normal with ability to perform basic computations and apply abstract reasoning and no evidence of hallucinations, delusions, obsessions or homicidal/suicidal ideation.  Musculoskeletal Global Assessment Spine, Ribs and Pelvis - no instability, subluxation or laxity. Right Upper Extremity - no instability, subluxation or laxity.  Lymphatic Head & Neck  General Head & Neck Lymphatics: Bilateral - Description - No Localized lymphadenopathy. Axillary  General Axillary Region: Bilateral - Description - No Localized lymphadenopathy. Femoral & Inguinal  Generalized Femoral & Inguinal Lymphatics: Left - Description - No Localized lymphadenopathy. Right - Description - No Localized lymphadenopathy.    Assessment & Plan   BILATERAL INGUINAL HERNIA  Impression: Definite right and probable left inguinal hernias. I think he would benefit from repair. Laparoscopic underlay with mesh. Okay to continue aspirin perioperatively.  Cleared by neurology.  Ready for suirgery.  The anatomy & physiology of the abdominal wall and pelvic floor was discussed. The pathophysiology of hernias in the inguinal and pelvic region was discussed. Natural history risks such as progressive enlargement, pain, incarceration, and strangulation was discussed. Contributors to complications such as  smoking, obesity, diabetes, prior surgery, etc were discussed.  I feel the risks of no intervention will lead to serious problems that outweigh the operative risks; therefore, I recommended surgery to reduce and repair the hernia. I explained laparoscopic techniques with possible need for an open approach. I noted usual use of mesh to patch and/or buttress hernia repair  Risks such as bleeding, infection, abscess, need for further treatment, heart attack, death, and other risks were discussed. I noted a good likelihood this will help address the problem. Goals of post-operative recovery were discussed as well. Possibility that this will not correct all symptoms was explained. I stressed the importance of low-impact activity, aggressive pain control, avoiding constipation, & not pushing through pain to minimize risk of post-operative chronic pain or injury. Possibility of reherniation was discussed. We will work to minimize complications.  An educational handout further explaining the pathology & treatment options was given as well. Questions were answered. The patient expresses understanding & wishes to proceed with surgery.   Adin Hector, M.D., F.A.C.S. Gastrointestinal and Minimally Invasive Surgery [facility] [address] [place], [place] [address] [phone] Main / Paging

## 2017-11-26 DIAGNOSIS — H16142 Punctate keratitis, left eye: Secondary | ICD-10-CM | POA: Diagnosis not present

## 2018-03-26 ENCOUNTER — Telehealth: Payer: Self-pay | Admitting: Medical

## 2018-03-26 ENCOUNTER — Encounter: Payer: Self-pay | Admitting: Medical

## 2018-03-26 ENCOUNTER — Ambulatory Visit (HOSPITAL_BASED_OUTPATIENT_CLINIC_OR_DEPARTMENT_OTHER)
Admission: RE | Admit: 2018-03-26 | Discharge: 2018-03-26 | Disposition: A | Discharge status: Home / Self Care | Payer: PPO | Source: Ambulatory Visit | Attending: Medical | Admitting: Medical

## 2018-03-26 ENCOUNTER — Ambulatory Visit (INDEPENDENT_AMBULATORY_CARE_PROVIDER_SITE_OTHER): Payer: PPO | Admitting: Medical

## 2018-03-26 VITALS — BP 130/68 | HR 61 | Temp 97.9°F | Resp 16 | Ht 68.0 in | Wt 158.6 lb

## 2018-03-26 DIAGNOSIS — R319 Hematuria, unspecified: Secondary | ICD-10-CM

## 2018-03-26 DIAGNOSIS — R0989 Other specified symptoms and signs involving the circulatory and respiratory systems: Secondary | ICD-10-CM | POA: Diagnosis not present

## 2018-03-26 DIAGNOSIS — Z125 Encounter for screening for malignant neoplasm of prostate: Secondary | ICD-10-CM

## 2018-03-26 DIAGNOSIS — E785 Hyperlipidemia, unspecified: Secondary | ICD-10-CM

## 2018-03-26 DIAGNOSIS — Z9889 Other specified postprocedural states: Secondary | ICD-10-CM

## 2018-03-26 DIAGNOSIS — Z8719 Personal history of other diseases of the digestive system: Secondary | ICD-10-CM | POA: Diagnosis not present

## 2018-03-26 DIAGNOSIS — Z23 Encounter for immunization: Secondary | ICD-10-CM | POA: Diagnosis not present

## 2018-03-26 DIAGNOSIS — R1031 Right lower quadrant pain: Secondary | ICD-10-CM | POA: Diagnosis not present

## 2018-03-26 DIAGNOSIS — Z1283 Encounter for screening for malignant neoplasm of skin: Secondary | ICD-10-CM

## 2018-03-26 DIAGNOSIS — Z87898 Personal history of other specified conditions: Secondary | ICD-10-CM | POA: Diagnosis not present

## 2018-03-26 LAB — CBC WITH DIFFERENTIAL/PLATELET
Basophils Absolute: 0.1 10*3/uL (ref 0.0–0.1)
Basophils Relative: 1.3 % (ref 0.0–3.0)
Eosinophils Absolute: 0.2 10*3/uL (ref 0.0–0.7)
Eosinophils Relative: 4.6 % (ref 0.0–5.0)
HCT: 40.3 % (ref 39.0–52.0)
Hemoglobin: 13.3 g/dL (ref 13.0–17.0)
Lymphocytes Relative: 23.2 % (ref 12.0–46.0)
Lymphs Abs: 1.2 10*3/uL (ref 0.7–4.0)
MCHC: 33 g/dL (ref 30.0–36.0)
MCV: 95.5 fl (ref 78.0–100.0)
Monocytes Absolute: 0.7 10*3/uL (ref 0.1–1.0)
Monocytes Relative: 15 % — ABNORMAL HIGH (ref 3.0–12.0)
Neutro Abs: 2.8 10*3/uL (ref 1.4–7.7)
Neutrophils Relative %: 55.9 % (ref 43.0–77.0)
Platelets: 164 10*3/uL (ref 150.0–400.0)
RBC: 4.22 Mil/uL (ref 4.22–5.81)
RDW: 13.5 % (ref 11.5–15.5)
WBC: 5 10*3/uL (ref 4.0–10.5)

## 2018-03-26 LAB — POC URINALSYSI DIPSTICK (AUTOMATED)
Bilirubin, UA: NEGATIVE
Glucose, UA: NEGATIVE
Ketones, UA: NEGATIVE
Leukocytes, UA: NEGATIVE
Nitrite, UA: NEGATIVE
Protein, UA: NEGATIVE
Spec Grav, UA: 1.01 (ref 1.010–1.025)
Urobilinogen, UA: NEGATIVE E.U./dL — AB
pH, UA: 6 (ref 5.0–8.0)

## 2018-03-26 LAB — COMPREHENSIVE METABOLIC PANEL
ALT: 19 U/L (ref 0–53)
AST: 22 U/L (ref 0–37)
Albumin: 4.3 g/dL (ref 3.5–5.2)
Alkaline Phosphatase: 80 U/L (ref 39–117)
BUN: 19 mg/dL (ref 6–23)
CO2: 30 mEq/L (ref 19–32)
Calcium: 9.6 mg/dL (ref 8.4–10.5)
Chloride: 103 mEq/L (ref 96–112)
Creatinine, Ser: 1.08 mg/dL (ref 0.40–1.50)
GFR: 72.02 mL/min (ref >60.00)
Glucose, Bld: 93 mg/dL (ref 70–99)
Potassium: 4.5 mEq/L (ref 3.5–5.1)
Sodium: 140 mEq/L (ref 135–145)
Total Bilirubin: 0.6 mg/dL (ref 0.2–1.2)
Total Protein: 6.8 g/dL (ref 6.0–8.3)

## 2018-03-26 LAB — LIPID PANEL
Cholesterol: 147 mg/dL (ref 0–200)
HDL: 46.6 mg/dL (ref >39.00)
LDL Cholesterol: 75 mg/dL (ref 0–99)
NonHDL: 100.59
Total CHOL/HDL Ratio: 3
Triglycerides: 129 mg/dL (ref 0.0–149.0)
VLDL: 25.8 mg/dL (ref 0.0–40.0)

## 2018-03-26 LAB — PSA: PSA: 3.88 ng/mL (ref 0.10–4.00)

## 2018-03-26 MED ORDER — LEVOCETIRIZINE DIHYDROCHLORIDE 5 MG PO TABS: 5.0000 mg | ORAL_TABLET | Freq: Every evening | ORAL | 3 refills | Status: DC

## 2018-03-26 NOTE — Telephone Encounter (Signed)
Referral to urologist placed. 

## 2018-03-26 NOTE — Progress Notes (Addendum)
Subjective:    Patient ID: Hunter Vega, male    DOB: 04/19/1951, 67 y.o.   MRN: [id number]  HPI  Pt in today states he has recurrent chest congestion. Pt states about 5 months. Pt did phone call visit with his insurance. Pt was coughing up some mucus at that time and was coughing for about 5 days. He was given azithromycin antibiotic. He states he felt better afterwards for about a month. Then got recurrent mild chest congestion.Has rare cough. No wheezing. Pt states low level feeling of loose phelm in chest. He does not with this maybe occasional sinus congestion. He speculates mayb pnd. He is not sure. Sometimes catches himself clearing his throat.  Pt did have hernia operation on rt side. He had operation beginning of February. He states still irritation in the area. When he walk long distance or lift things some discomfort. Pt states he feels like bulge never resolved. He thought initially muscle pain but now beginning to wonder.  Pt is still on lipitor. No side effects. He is eating healthy. Pt does walk about 5 miles twice a week and he plays raquetball 3 times a week.  Pt is still on lamictal and seeing neurologist yearly basis.  Pt has not had flu vaccine yet. He will get psv 13 vaccine today.    Review of Systems  Constitutional: Negative for chills, diaphoresis, fatigue and fever.  HENT: Positive for postnasal drip. Negative for congestion, ear discharge, sinus pressure and sinus pain.        Clears throat daily.   Respiratory: Positive for cough. Negative for chest tightness, shortness of breath and wheezing.        Chest congestoin.  Rare cough.  Cardiovascular: Negative for chest pain and palpitations.  Gastrointestinal: Negative for abdominal pain, nausea and vomiting.       Groin pain and bulge on rt side.  Genitourinary: Negative for decreased urine volume, difficulty urinating, enuresis, frequency, hematuria, penile pain and testicular pain.       No family  history of prostate dx.  Musculoskeletal: Negative for back pain and myalgias.  Skin: Negative for rash.  Hematological: Negative for adenopathy. Does not bruise/bleed easily.  Psychiatric/Behavioral: Negative for behavioral problems and confusion.    Past Medical History:  Diagnosis Date  . History of seizure    reports no seizure since 2015   . Hx of exercise stress test    reports  had done to eval since coronary artery stent was placed ; reports test was "normal"   . Hyperlipidemia   . PSVT (paroxysmal supraventricular tachycardia) ([place])    reports no knowledge of this   . Sleep apnea    reports no longer has sicne losing weight      Social History   Socioeconomic History  . Marital status: Married    Spouse name: Not on file  . Number of children: Not on file  . Years of education: Not on file  . Highest education level: Not on file  Occupational History  . Not on file  Social Needs  . Financial resource strain: Not on file  . Food insecurity:    Worry: Not on file    Inability: Not on file  . Transportation needs:    Medical: Not on file    Non-medical: Not on file  Tobacco Use  . Smoking status: Never Smoker  . Smokeless tobacco: Never Used  Substance and Sexual Activity  . Alcohol use: No  . Drug  use: No  . Sexual activity: Yes  Lifestyle  . Physical activity:    Days per week: Not on file    Minutes per session: Not on file  . Stress: Not on file  Relationships  . Social connections:    Talks on phone: Not on file    Gets together: Not on file    Attends religious service: Not on file    Active member of club or organization: Not on file    Attends meetings of clubs or organizations: Not on file    Relationship status: Not on file  . Intimate partner violence:    Fear of current or ex partner: Not on file    Emotionally abused: Not on file    Physically abused: Not on file    Forced sexual activity: Not on file  Other Topics Concern  . Not on  file  Social History Narrative   Lives with wife   Caffeine use: 1 cup corfee daily   Left handed     Past Surgical History:  Procedure Laterality Date  . CORONARY STENT PLACEMENT     x2. Mid LAD  . INGUINAL HERNIA REPAIR Bilateral 09/26/2017   Procedure: LAPAROSCOPIC BILATERAL INGUINAL HERNIA REPAIR;  Surgeon: Michael Boston, MD;  Location: [facility];  Service: General;  Laterality: Bilateral;  . INSERTION OF MESH Bilateral 09/26/2017   Procedure: INSERTION OF MESH;  Surgeon: Michael Boston, MD;  Location: [facility];  Service: General;  Laterality: Bilateral;    Family History  Problem Relation Age of Onset  . Liver disease Mother   . Heart attack Father     Allergies  Allergen Reactions  . Penicillins Swelling    Arm swelling Has patient had a PCN reaction causing immediate rash, facial/tongue/throat swelling, SOB or lightheadedness with hypotension: No Has patient had a PCN reaction causing severe rash involving mucus membranes or skin necrosis: No Has patient had a PCN reaction that required hospitalization: No Has patient had a PCN reaction occurring within the last 10 years: No If all of the above answers are "NO", then may proceed with Cephalosporin use.     Current Outpatient Medications on File Prior to Visit  Medication Sig Dispense Refill  . aspirin EC 81 MG tablet Take 81 mg by mouth daily.     Marland Kitchen atorvastatin (LIPITOR) 80 MG tablet Take 80 mg by mouth at bedtime.     . lamoTRIgine (LAMICTAL) 150 MG tablet Take 1 tablet (150 mg total) by mouth 2 (two) times daily. 180 tablet 3   No current facility-administered medications on file prior to visit.     BP 130/68   Pulse 61   Temp 97.9 F (36.6 C) (Oral)   Resp 16   Ht 5\' 8"  (1.727 m)   Wt 158 lb 9.6 oz (71.9 kg)   SpO2 100%   BMI 24.12 kg/m       Objective:   Physical Exam  General  Mental Status - Alert. General Appearance - Well groomed. Not in acute distress.  Skin Rashes- No Rashes.  HEENT Head-  Normal. Ear Auditory Canal - Left- Normal. Right - Normal.Tympanic Membrane- Left- Normal. Right- Normal. Eye Sclera/Conjunctiva- Left- Normal. Right- Normal. Nose & Sinuses Nasal Mucosa- Left-  Boggy and Congested. Right-  Boggy and  Congested.Bilateral maxillary and frontal sinus pressure. Mouth & Throat Lips: Upper Lip- Normal: no dryness, cracking, pallor, cyanosis, or vesicular eruption. Lower Lip-Normal: no dryness, cracking, pallor, cyanosis or vesicular eruption. Buccal Mucosa- Bilateral-  No Aphthous ulcers. Oropharynx- No Discharge or Erythema. Tonsils: Characteristics- Bilateral- No Erythema or Congestion. Size/Enlargement- Bilateral- No enlargement. Discharge- bilateral-None.  Neck Neck- Supple. No Masses.   Chest and Lung Exam Auscultation: Breath Sounds:-Clear even and unlabored.  Cardiovascular Auscultation:Rythm- Regular, rate and rhythm. Murmurs & Other Heart Sounds:Ausculatation of the heart reveal- No Murmurs.  Lymphatic Head & Neck General Head & Neck Lymphatics: Bilateral: Description- No Localized lymphadenopathy.   General Mental Status- Alert. General Appearance- Not in acute distress.   Skin General: Color- Normal Color. Moisture- Normal Moisture. All scattered moles on back.  One in middle of the back looks a little bit irregular but not large.  Neck Carotid Arteries- Normal color. Moisture- Normal Moisture. No carotid bruits. No JVD.  Chest and Lung Exam Auscultation: Breath Sounds:-Normal.  Cardiovascular Auscultation:Rythm- Regular. Murmurs & Other Heart Sounds:Auscultation of the heart reveals- No Murmurs.  Abdomen Inspection:-Inspeection Normal. Palpation/Percussion:Note:No mass. Palpation and Percussion of the abdomen reveal- Non Tender, Non Distended + BS, no rebound or guarding.    Neurologic Cranial Nerve exam:- CN III-XII intact(No nystagmus), symmetric smile. Strength:- 5/5 equal and symmetric strength both upper and lower  extremities.  Rectum exam- prostate normal in size, symmetric with no nodules.      Assessment & Plan:  For your recent mild chest congestion for months, I did place chest x-ray order today.  You can get that done downstairs.  Currently not prescribing antibiotic.  Will assess your response to Xyzal.  But if you develop any thick colored mucus let me know and would probably resend azithromycin.  For history of hyperlipidemia, did place a metabolic panel and lipid panel today.  For history of seizures and to use Lamictal, did place a CBC order.  In addition lab work to include a PSA for screening purposes.  After evaluating numerous small moles, did place screening skin cancer referral to dermatologist.  For faint mild bulge on physical exam today and recent pain in right inguinal region, I did place referral to your former general surgeon.  You do have some postnasal drainage on exam and that this might be a reason you are clear your throat intermittently.  Possible mild allergies.  I did send a prescription of Xyzal antihistamine to use at night.  Let me know if you notice improvement of your symptoms.  Follow-up date to be determined after lab review.  For your recent mild chest congestion for months, I did place chest x-ray order today.  You can get that done downstairs.  Currently not prescribing antibiotic.  Will assess your response to Xyzal.  But if you develop any thick colored mucus let me know and would probably resend azithromycin.  For history of hyperlipidemia, did place a metabolic panel and lipid panel today.  For history of seizures and to use Lamictal, did place a CBC order.  In addition lab work to include a PSA for screening purposes.  After evaluating numerous small moles, did place screening skin cancer referral to dermatologist.  For faint mild bulge on physical exam today and recent pain in right inguinal region, I did place referral to your former general  surgeon.  You do have some postnasal drainage on exam and that this might be a reason you are clear your throat intermittently.  Possible mild allergies.  I did send a prescription of Xyzal antihistamine to use at night.  Let me know if you notice improvement of your symptoms.  Follow-up date to be determined after lab review.  [name], PA-C  40 minutes spent with patient. Counseling on diagnosis and treatment plans for conditions as well as coordination of care/placing referral. In addition explained on possible referral to urologist in event urine again shows trace hematuria.

## 2018-03-26 NOTE — Patient Instructions (Addendum)
For your recent mild chest congestion for months, I did place chest x-ray order today.  You can get that done downstairs.  Currently not prescribing antibiotic.  Will assess your response to Xyzal.  But if you develop any thick colored mucus let me know and would probably resend azithromycin.  For history of hyperlipidemia, did place a metabolic panel and lipid panel today.  For history of seizures and to use Lamictal, did place a CBC order.  In addition lab work to include a PSA for screening purposes.  After evaluating numerous small moles, did place screening skin cancer referral to dermatologist.  For faint mild bulge on physical exam today and recent pain in right inguinal region, I did place referral to your former general surgeon.  You do have some postnasal drainage on exam and that this might be a reason you are clear your throat intermittently.  Possible mild allergies.  I did send a prescription of Xyzal antihistamine to use at night.  Let me know if you notice improvement of your symptoms.    Follow-up date to be determined after lab review.

## 2018-03-26 NOTE — Telephone Encounter (Signed)
Will you look at that referral to general surgeon. Let me know if you have any problems. When make referral explain former pt and can they see him relativley soon

## 2018-04-16 ENCOUNTER — Telehealth: Payer: Self-pay | Admitting: Medical

## 2018-04-16 DIAGNOSIS — E785 Hyperlipidemia, unspecified: Secondary | ICD-10-CM

## 2018-04-16 DIAGNOSIS — I25119 Atherosclerotic heart disease of native coronary artery with unspecified angina pectoris: Secondary | ICD-10-CM

## 2018-04-16 NOTE — Telephone Encounter (Signed)
Copied from [place] [id number]. Topic: Referral - Request for Referral >> Apr 10, 2018  4:45 PM Bea Graff, NT wrote: Has patient seen PCP for this complaint? Yes.   *If NO, is insurance requiring patient see PCP for this issue before PCP can refer them? Referral for which specialty: Cardiology Preferred provider/office: [facility] Reason for referral: His cardiologist is retiring and pt is wanting to be referred to [place] [facility].

## 2018-04-16 NOTE — Telephone Encounter (Signed)
Referred to cardiologist at med center.

## 2018-04-16 NOTE — Telephone Encounter (Signed)
Referral to cardiologist placed. 

## 2018-04-18 ENCOUNTER — Ambulatory Visit (INDEPENDENT_AMBULATORY_CARE_PROVIDER_SITE_OTHER): Payer: PPO | Admitting: Cardiology

## 2018-04-18 ENCOUNTER — Encounter: Payer: Self-pay | Admitting: Cardiology

## 2018-04-18 VITALS — BP 132/70 | HR 68 | Ht 68.0 in | Wt 162.1 lb

## 2018-04-18 DIAGNOSIS — I25118 Atherosclerotic heart disease of native coronary artery with other forms of angina pectoris: Secondary | ICD-10-CM | POA: Diagnosis not present

## 2018-04-18 DIAGNOSIS — E782 Mixed hyperlipidemia: Secondary | ICD-10-CM | POA: Diagnosis not present

## 2018-04-18 DIAGNOSIS — I471 Supraventricular tachycardia: Secondary | ICD-10-CM | POA: Diagnosis not present

## 2018-04-18 MED ORDER — ATORVASTATIN CALCIUM 80 MG PO TABS: 80.0000 mg | ORAL_TABLET | Freq: Every day | ORAL | 1 refills | Status: DC

## 2018-04-18 NOTE — Progress Notes (Signed)
Cardiology Office Note:    Date:  04/18/2018   ID:  Hunter Vega, DOB 16-Jun-1950, MRN [id number]  PCP:  [name], PA-C  Cardiologist:  Shirlee More, MD   Referring MD: [name], PA-C  ASSESSMENT:    1. Coronary artery disease of native artery of native heart with stable angina pectoris ([place])   2. Mixed hyperlipidemia   3. PSVT (paroxysmal supraventricular tachycardia) ([facility])    PLAN:    In order of problems listed above:  1. Stable chronic CAD New York Heart Association class I exercises a high level has had no anginal discomfort has had a normal stress echo in the last year and his previous practice and at this time I did continue his current medical treatment aspirin high intensity statin I do not think he requires antianginal medications and will plan on a follow-up stress echocardiogram in the next 1 to 2 years. 2. They will continue his high intensity statin recent lipids reviewed at target 3. Symptomatic presently on no treatment  Next appointment   57months   Medication Adjustments/Labs and Tests Ordered: Current medicines are reviewed at length with the patient today.  Concerns regarding medicines are outlined above.  Orders Placed This Encounter  Procedures  . EKG 12-Lead   Meds ordered this encounter  Medications  . atorvastatin (LIPITOR) 80 MG tablet    Sig: Take 1 tablet (80 mg total) by mouth at bedtime.    Dispense:  90 tablet    Refill:  1     Chief Complaint  Patient presents with  . Coronary Artery Disease    to establish care  . Hyperlipidemia    History of Present Illness:    Hunter Vega is a 67 y.o. male history of CAD PCI and stent left anterior descending coronary artery 2014 hyperlipidemia and supraventricular tachycardia who is being seen today for establishing cardiology care at the request of Saguier, Clare, PA-C. In care everywhere he was [facility] at [facility]  [facility]/30/2017 and at that time a stress echo was ordered  and performed however I cannot see the report.  Relates this study was normal.  He continues to do well and has had no angina exercise intolerance dyspnea palpitation or syncope and plays competitive racquetball 3 days/week.  Tolerates his statin and has no muscular symptoms.  Tenuous current medical treatment chronic stable CAD and lipids at target and plan to see back in the office in 6 months for decision on future stress testing at subsequent visits  Past Medical History:  Diagnosis Date  . History of seizure    reports no seizure since 2015   . Hx of exercise stress test    reports  had done to eval since coronary artery stent was placed ; reports test was "normal"   . Hyperlipidemia   . PSVT (paroxysmal supraventricular tachycardia) ([place])    reports no knowledge of this   . Sleep apnea    reports no longer has sicne losing weight     Past Surgical History:  Procedure Laterality Date  . CARDIAC CATHETERIZATION    . CORONARY ANGIOPLASTY    . CORONARY STENT PLACEMENT     x2. Mid LAD  . INGUINAL HERNIA REPAIR Bilateral 09/26/2017   Procedure: LAPAROSCOPIC BILATERAL INGUINAL HERNIA REPAIR;  Surgeon: Michael Boston, MD;  Location: [facility];  Service: General;  Laterality: Bilateral;  . INSERTION OF MESH Bilateral 09/26/2017   Procedure: INSERTION OF MESH;  Surgeon: Michael Boston, MD;  Location: [facility];  Service: General;  Laterality: Bilateral;    Current Medications: Current Meds  Medication Sig  . aspirin EC 81 MG tablet Take 81 mg by mouth daily.   Marland Kitchen atorvastatin (LIPITOR) 80 MG tablet Take 1 tablet (80 mg total) by mouth at bedtime.  . lamoTRIgine (LAMICTAL) 150 MG tablet Take 1 tablet (150 mg total) by mouth 2 (two) times daily.  Marland Kitchen levocetirizine (XYZAL) 5 MG tablet Take 1 tablet (5 mg total) by mouth every evening.  . [DISCONTINUED] atorvastatin (LIPITOR) 80 MG tablet Take 80 mg by mouth at bedtime.      Allergies:   Penicillins   Social History   Socioeconomic History  .  Marital status: Married    Spouse name: Not on file  . Number of children: Not on file  . Years of education: Not on file  . Highest education level: Not on file  Occupational History  . Not on file  Social Needs  . Financial resource strain: Not on file  . Food insecurity:    Worry: Not on file    Inability: Not on file  . Transportation needs:    Medical: Not on file    Non-medical: Not on file  Tobacco Use  . Smoking status: Never Smoker  . Smokeless tobacco: Never Used  Substance and Sexual Activity  . Alcohol use: No  . Drug use: No  . Sexual activity: Yes  Lifestyle  . Physical activity:    Days per week: Not on file    Minutes per session: Not on file  . Stress: Not on file  Relationships  . Social connections:    Talks on phone: Not on file    Gets together: Not on file    Attends religious service: Not on file    Active member of club or organization: Not on file    Attends meetings of clubs or organizations: Not on file    Relationship status: Not on file  Other Topics Concern  . Not on file  Social History Narrative   Lives with wife   Caffeine use: 1 cup corfee daily   Left handed      Family History: The patient's family history includes Diabetes in his paternal uncle; Heart attack in his father; Liver disease in his mother.  ROS:   Review of Systems  Constitution: Negative.  HENT: Negative.   Eyes: Negative.   Cardiovascular: Negative.   Respiratory: Negative.   Endocrine: Negative.   Hematologic/Lymphatic: Bruises/bleeds easily (bruise over nose from target practice).  Skin: Negative.   Musculoskeletal: Negative.   Gastrointestinal: Negative.   Genitourinary: Negative.   Neurological: Negative.   Psychiatric/Behavioral: Negative.   Allergic/Immunologic: Negative.    Please see the history of present illness.     All other systems reviewed and are negative.  EKGs/Labs/Other Studies Reviewed:    The following studies were reviewed  today:  EKG 09/18/2017 independently reviewed sinus rhythm and normal EKG:  EKG is  ordered today.  The ekg ordered today demonstrates [place] normal Recent Labs: 03/26/2018: ALT 19; BUN 19; Creatinine, Ser 1.08; Hemoglobin 13.3; Platelets 164.0; Potassium 4.5; Sodium 140  Recent Lipid Panel    Component Value Date/Time   CHOL 147 03/26/2018 1019   TRIG 129.0 03/26/2018 1019   HDL 46.60 03/26/2018 1019   CHOLHDL 3 03/26/2018 1019   VLDL 25.8 03/26/2018 1019   LDLCALC 75 03/26/2018 1019    Physical Exam:    VS:  BP 132/70 (BP Location: Left  Arm, Patient Position: Sitting, Cuff Size: Normal)   Pulse 68   Ht 5\' 8"  (1.727 m)   Wt 162 lb 1.9 oz (73.5 kg)   SpO2 98%   BMI 24.65 kg/m     Wt Readings from Last 3 Encounters:  04/18/18 162 lb 1.9 oz (73.5 kg)  03/26/18 158 lb 9.6 oz (71.9 kg)  09/26/17 157 lb (71.2 kg)     GEN:  Well nourished, well developed in no acute distress HEENT: Normal NECK: No JVD; No carotid bruits LYMPHATICS: No lymphadenopathy CARDIAC: RRR, no murmurs, rubs, gallops RESPIRATORY:  Clear to auscultation without rales, wheezing or rhonchi  ABDOMEN: Soft, non-tender, non-distended MUSCULOSKELETAL:  No edema; No deformity  SKIN: Warm and dry NEUROLOGIC:  Alert and oriented x 3 PSYCHIATRIC:  Normal affect     Signed, Shirlee More, MD  04/18/2018 11:32 AM    [place]

## 2018-04-18 NOTE — Patient Instructions (Signed)

## 2018-05-08 ENCOUNTER — Encounter: Payer: Self-pay | Admitting: *Deleted

## 2018-05-17 DIAGNOSIS — R311 Benign essential microscopic hematuria: Secondary | ICD-10-CM | POA: Diagnosis not present

## 2018-05-20 NOTE — Progress Notes (Deleted)
Subjective:   Hunter Vega is a 67 y.o. male who presents for Medicare Annual/Subsequent preventive examination.  Review of Systems: No ROS.  Medicare Wellness Visit. Additional risk factors are reflected in the social history.   Sleep patterns:  Home Safety/Smoke Alarms: Feels safe in home. Smoke alarms in place.   Male:   CCS- 06/14/09-normal    PSA-  Lab Results  Component Value Date   PSA 3.88 03/26/2018       Objective:    Vitals: There were no vitals taken for this visit.  There is no height or weight on file to calculate BMI.  Advanced Directives 09/18/2017 05/24/2017 11/23/2016 08/22/2016 07/25/2016  Does Patient Have a Medical Advance Directive? Yes Yes Yes Yes Yes  Type of Advance Directive Living will [place];Living will - [place];Living will [place];Living will  Does patient want to make changes to medical advance directive? No - Patient declined - No - Patient declined - -  Copy of [place] in Chart? - No - copy requested - - -    Tobacco Social History   Tobacco Use  Smoking Status Never Smoker  Smokeless Tobacco Never Used     Counseling given: Not Answered   Clinical Intake:                       Past Medical History:  Diagnosis Date  . History of seizure    reports no seizure since 2015   . Hx of exercise stress test    reports  had done to eval since coronary artery stent was placed ; reports test was "normal"   . Hyperlipidemia   . PSVT (paroxysmal supraventricular tachycardia) ([place])    reports no knowledge of this   . Sleep apnea    reports no longer has sicne losing weight    Past Surgical History:  Procedure Laterality Date  . CARDIAC CATHETERIZATION    . CORONARY ANGIOPLASTY    . CORONARY STENT PLACEMENT     x2. Mid LAD  . INGUINAL HERNIA REPAIR Bilateral 09/26/2017   Procedure: LAPAROSCOPIC BILATERAL INGUINAL HERNIA REPAIR;  Surgeon:  Michael Boston, MD;  Location: [facility];  Service: General;  Laterality: Bilateral;  . INSERTION OF MESH Bilateral 09/26/2017   Procedure: INSERTION OF MESH;  Surgeon: Michael Boston, MD;  Location: [facility];  Service: General;  Laterality: Bilateral;   Family History  Problem Relation Age of Onset  . Liver disease Mother   . Heart attack Father   . Diabetes Paternal Uncle    Social History   Socioeconomic History  . Marital status: Married    Spouse name: Not on file  . Number of children: Not on file  . Years of education: Not on file  . Highest education level: Not on file  Occupational History  . Not on file  Social Needs  . Financial resource strain: Not on file  . Food insecurity:    Worry: Not on file    Inability: Not on file  . Transportation needs:    Medical: Not on file    Non-medical: Not on file  Tobacco Use  . Smoking status: Never Smoker  . Smokeless tobacco: Never Used  Substance and Sexual Activity  . Alcohol use: No  . Drug use: No  . Sexual activity: Yes  Lifestyle  . Physical activity:    Days per week: Not on file  Minutes per session: Not on file  . Stress: Not on file  Relationships  . Social connections:    Talks on phone: Not on file    Gets together: Not on file    Attends religious service: Not on file    Active member of club or organization: Not on file    Attends meetings of clubs or organizations: Not on file    Relationship status: Not on file  Other Topics Concern  . Not on file  Social History Narrative   Lives with wife   Caffeine use: 1 cup corfee daily   Left handed     Outpatient Encounter Medications as of 05/27/2018  Medication Sig  . aspirin EC 81 MG tablet Take 81 mg by mouth daily.   Marland Kitchen atorvastatin (LIPITOR) 80 MG tablet Take 1 tablet (80 mg total) by mouth at bedtime.  . lamoTRIgine (LAMICTAL) 150 MG tablet Take 1 tablet (150 mg total) by mouth 2 (two) times daily.  Marland Kitchen levocetirizine (XYZAL) 5 MG tablet Take 1 tablet  (5 mg total) by mouth every evening.   No facility-administered encounter medications on file as of 05/27/2018.     Activities of Daily Living In your present state of health, do you have any difficulty performing the following activities: 09/18/2017 05/24/2017  Hearing? N N  Vision? N N  Comment - wears reading glasses. Dr.Samuels yearly.   Difficulty concentrating or making decisions? N N  Walking or climbing stairs? N N  Dressing or bathing? N N  Doing errands, shopping? N N  Preparing Food and eating ? - N  Using the Toilet? - N  In the past six months, have you accidently leaked urine? - N  Do you have problems with loss of bowel control? - N  Managing your Medications? - N  Managing your Finances? - N  Housekeeping or managing your Housekeeping? - N  Some recent data might be hidden    Patient Care Team: Saguier, Iris Pert as PCP - General (Internal Medicine) Michael Boston, MD as Consulting Physician (General Surgery) Kathlen Brunswick., MD as Referring Physician (Cardiology) Kathrynn Ducking, MD as Consulting Physician (Neurology)   Assessment:   This is a routine wellness examination for Hunter Vega. Physical assessment deferred to PCP.  Exercise Activities and Dietary recommendations   Diet (meal preparation, eat out, water intake, caffeinated beverages, dairy products, fruits and vegetables): {Desc; diets:16563} Breakfast: Lunch:  Dinner:      Goals    . Maintain current healthy lifestyle  (pt-stated)       Fall Risk Fall Risk  05/24/2017 08/22/2016 07/25/2016  Falls in the past year? No No No   Depression Screen PHQ 2/9 Scores 05/24/2017 08/22/2016 07/25/2016  PHQ - 2 Score 0 0 0    Cognitive Function        Immunization History  Administered Date(s) Administered  . Influenza, High Dose Seasonal PF 03/22/2017, 03/26/2018  . Pneumococcal Conjugate-13 03/26/2018   Screening Tests Health Maintenance  Topic Date Due  . TETANUS/TDAP  01/22/1968  .  Hepatitis C Screening  05/29/2048 (Originally 1951/01/11)  . PNA vac Low Risk Adult (2 of 2 - PPSV23) 03/27/2019  . COLONOSCOPY  06/15/2019  . INFLUENZA VACCINE  Completed        Plan:   ***  I have personally reviewed and noted the following in the patient's chart:   . Medical and social history . Use of alcohol, tobacco or illicit drugs  . Current medications and  supplements . Functional ability and status . Nutritional status . Physical activity . Advanced directives . List of other physicians . Hospitalizations, surgeries, and ER visits in previous 12 months . Vitals . Screenings to include cognitive, depression, and falls . Referrals and appointments  In addition, I have reviewed and discussed with patient certain preventive protocols, quality metrics, and best practice recommendations. A written personalized care plan for preventive services as well as general preventive health recommendations were provided to patient.     Shela Nevin, [place]  05/20/2018

## 2018-05-24 ENCOUNTER — Ambulatory Visit: Payer: PPO | Admitting: Family Medicine

## 2018-05-24 DIAGNOSIS — R05 Cough: Secondary | ICD-10-CM | POA: Diagnosis not present

## 2018-05-25 DIAGNOSIS — R42 Dizziness and giddiness: Secondary | ICD-10-CM | POA: Diagnosis not present

## 2018-05-25 DIAGNOSIS — R0600 Dyspnea, unspecified: Secondary | ICD-10-CM | POA: Diagnosis not present

## 2018-05-25 DIAGNOSIS — R110 Nausea: Secondary | ICD-10-CM | POA: Diagnosis not present

## 2018-05-25 DIAGNOSIS — J40 Bronchitis, not specified as acute or chronic: Secondary | ICD-10-CM | POA: Diagnosis not present

## 2018-05-27 ENCOUNTER — Ambulatory Visit: Payer: PPO | Admitting: *Deleted

## 2018-05-28 ENCOUNTER — Ambulatory Visit: Payer: PPO | Admitting: Medical

## 2018-05-30 ENCOUNTER — Encounter: Payer: Self-pay | Admitting: Medical

## 2018-05-31 ENCOUNTER — Telehealth: Payer: Self-pay | Admitting: Medical

## 2018-05-31 NOTE — Telephone Encounter (Signed)
Can you forward the address of that urgent care to whoever does our medical records up front. Has patient already signed release of information form that we can sent to that urgent care to get records. Not sure we will get unless front staff coordinates. Either sends signed form or gets patient to come and sign form.

## 2018-06-04 NOTE — Telephone Encounter (Signed)
LVM to inform pt that if needing medical records sent to another office, pt is needing to come to office and sign a release form. If pt had any question to call the office.

## 2018-06-10 ENCOUNTER — Ambulatory Visit (HOSPITAL_BASED_OUTPATIENT_CLINIC_OR_DEPARTMENT_OTHER)
Admission: RE | Admit: 2018-06-10 | Discharge: 2018-06-10 | Disposition: A | Discharge status: Home / Self Care | Payer: PPO | Source: Ambulatory Visit | Attending: Medical | Admitting: Medical

## 2018-06-10 ENCOUNTER — Encounter: Payer: Self-pay | Admitting: Medical

## 2018-06-10 ENCOUNTER — Telehealth: Payer: Self-pay | Admitting: Medical

## 2018-06-10 ENCOUNTER — Ambulatory Visit (INDEPENDENT_AMBULATORY_CARE_PROVIDER_SITE_OTHER): Payer: PPO | Admitting: Medical

## 2018-06-10 VITALS — BP 135/75 | HR 78 | Temp 98.2°F | Resp 16 | Ht 68.0 in | Wt 160.4 lb

## 2018-06-10 DIAGNOSIS — J40 Bronchitis, not specified as acute or chronic: Secondary | ICD-10-CM

## 2018-06-10 DIAGNOSIS — J189 Pneumonia, unspecified organism: Secondary | ICD-10-CM

## 2018-06-10 DIAGNOSIS — R062 Wheezing: Secondary | ICD-10-CM

## 2018-06-10 DIAGNOSIS — R05 Cough: Secondary | ICD-10-CM

## 2018-06-10 DIAGNOSIS — R059 Cough, unspecified: Secondary | ICD-10-CM

## 2018-06-10 DIAGNOSIS — J181 Lobar pneumonia, unspecified organism: Principal | ICD-10-CM

## 2018-06-10 LAB — CBC WITH DIFFERENTIAL/PLATELET
Basophils Absolute: 0 10*3/uL (ref 0.0–0.1)
Basophils Relative: 0.7 % (ref 0.0–3.0)
Eosinophils Absolute: 0.2 10*3/uL (ref 0.0–0.7)
Eosinophils Relative: 3 % (ref 0.0–5.0)
HCT: 37.1 % — ABNORMAL LOW (ref 39.0–52.0)
Hemoglobin: 12.4 g/dL — ABNORMAL LOW (ref 13.0–17.0)
Lymphocytes Relative: 15.6 % (ref 12.0–46.0)
Lymphs Abs: 1.1 10*3/uL (ref 0.7–4.0)
MCHC: 33.5 g/dL (ref 30.0–36.0)
MCV: 96.9 fl (ref 78.0–100.0)
Monocytes Absolute: 0.6 10*3/uL (ref 0.1–1.0)
Monocytes Relative: 9 % (ref 3.0–12.0)
Neutro Abs: 5.1 10*3/uL (ref 1.4–7.7)
Neutrophils Relative %: 71.7 % (ref 43.0–77.0)
Platelets: 157 10*3/uL (ref 150.0–400.0)
RBC: 3.83 Mil/uL — ABNORMAL LOW (ref 4.22–5.81)
RDW: 13.2 % (ref 11.5–15.5)
WBC: 7.1 10*3/uL (ref 4.0–10.5)

## 2018-06-10 MED ORDER — FLUTICASONE PROPIONATE 50 MCG/ACT NA SUSP
2.0000 | Freq: Every day | NASAL | 1 refills | Status: DC
Start: 2018-06-10 — End: 2018-09-18

## 2018-06-10 MED ORDER — BENZONATATE 100 MG PO CAPS: 100.0000 mg | ORAL_CAPSULE | Freq: Three times a day (TID) | ORAL | 0 refills | Status: DC | PRN

## 2018-06-10 MED ORDER — LEVOFLOXACIN 500 MG PO TABS: 500.0000 mg | ORAL_TABLET | Freq: Every day | ORAL | 0 refills | Status: DC

## 2018-06-10 MED ORDER — HYDROCODONE-HOMATROPINE 5-1.5 MG/5ML PO SYRP: 5.0000 mL | ORAL_SOLUTION | Freq: Four times a day (QID) | ORAL | 0 refills | Status: DC | PRN

## 2018-06-10 MED ORDER — FLUTICASONE PROPIONATE HFA 110 MCG/ACT IN AERO
2.0000 | INHALATION_SPRAY | Freq: Two times a day (BID) | RESPIRATORY_TRACT | 3 refills | Status: DC
Start: 2018-06-10 — End: 2018-07-11

## 2018-06-10 MED ORDER — AZITHROMYCIN 250 MG PO TABS: ORAL_TABLET | ORAL | 0 refills | Status: DC

## 2018-06-10 MED ORDER — PREDNISONE 10 MG PO TABS: ORAL_TABLET | ORAL | 0 refills | Status: DC

## 2018-06-10 NOTE — Progress Notes (Signed)
Subjective:    Patient ID: Hunter Vega, male    DOB: [birth year], 68 y.o.   MRN: [id number]  HPI   Pt seen around May 25, 2018 at [facility] in [place]. He sent me my chart note which reads below.  "On December 28,2019 I had to go to an urgent care center . I was having severe congestion , nausea, fever and my whole body ached . They checked my out and came to the conclusion that I had bronchitis. I was given several meds. Doxycycline, prednisone,abuterol, and Zofran. I am beginning to feel much better. My question is since this was at an urgent care center in [place], [place]. , Will this information be sent to your office and entered into my records?The care center address is "  Pt went to UC on May 23, 2018. At [facility] he was dx with uri. Then 2 days later he got the above meds. He was evaluated at [facility] in [place]. Flu test was negative. No cxr done. He states within 2 days his body aches went away in 2 days. HA and nausea went away quickly within 24 hours.  What persisted was his cough. Cough is day and night.he does report some transient wheeze.  He states when is able to bring up mucus it is greenish color..  Pt is about to go out of town to [place] either this wed or Thursday. He will watching his grandkids.  Pt has never been a smoker. He never had asthma in past.     Review of Systems  Constitutional: Negative for chills, fatigue and fever.  HENT: Positive for congestion. Negative for ear pain, mouth sores, nosebleeds and postnasal drip.   Respiratory: Positive for cough and wheezing. Negative for chest tightness and shortness of breath.   Cardiovascular: Negative for chest pain and palpitations.  Gastrointestinal: Negative for abdominal pain.  Musculoskeletal: Negative for back pain.  Skin: Negative for rash.  Hematological: Negative for adenopathy. Does not bruise/bleed easily.  Psychiatric/Behavioral: Negative for behavioral problems, confusion and  dysphoric mood.    Past Medical History:  Diagnosis Date  . History of seizure    reports no seizure since 2015   . Hx of exercise stress test    reports  had done to eval since coronary artery stent was placed ; reports test was "normal"   . Hyperlipidemia   . PSVT (paroxysmal supraventricular tachycardia) ([place])    reports no knowledge of this   . Sleep apnea    reports no longer has sicne losing weight      Social History   Socioeconomic History  . Marital status: Married    Spouse name: Not on file  . Number of children: Not on file  . Years of education: Not on file  . Highest education level: Not on file  Occupational History  . Not on file  Social Needs  . Financial resource strain: Not on file  . Food insecurity:    Worry: Not on file    Inability: Not on file  . Transportation needs:    Medical: Not on file    Non-medical: Not on file  Tobacco Use  . Smoking status: Never Smoker  . Smokeless tobacco: Never Used  Substance and Sexual Activity  . Alcohol use: No  . Drug use: No  . Sexual activity: Yes  Lifestyle  . Physical activity:    Days per week: Not on file    Minutes per session: Not on file  .  Stress: Not on file  Relationships  . Social connections:    Talks on phone: Not on file    Gets together: Not on file    Attends religious service: Not on file    Active member of club or organization: Not on file    Attends meetings of clubs or organizations: Not on file    Relationship status: Not on file  . Intimate partner violence:    Fear of current or ex partner: Not on file    Emotionally abused: Not on file    Physically abused: Not on file    Forced sexual activity: Not on file  Other Topics Concern  . Not on file  Social History Narrative   Lives with wife   Caffeine use: 1 cup corfee daily   Left handed     Past Surgical History:  Procedure Laterality Date  . CARDIAC CATHETERIZATION    . CORONARY ANGIOPLASTY    . CORONARY STENT  PLACEMENT     x2. Mid LAD  . INGUINAL HERNIA REPAIR Bilateral 09/26/2017   Procedure: LAPAROSCOPIC BILATERAL INGUINAL HERNIA REPAIR;  Surgeon: Michael Boston, MD;  Location: [facility];  Service: General;  Laterality: Bilateral;  . INSERTION OF MESH Bilateral 09/26/2017   Procedure: INSERTION OF MESH;  Surgeon: Michael Boston, MD;  Location: [facility];  Service: General;  Laterality: Bilateral;    Family History  Problem Relation Age of Onset  . Liver disease Mother   . Heart attack Father   . Diabetes Paternal Uncle     Allergies  Allergen Reactions  . Penicillins Swelling    Arm swelling Has patient had a PCN reaction causing immediate rash, facial/tongue/throat swelling, SOB or lightheadedness with hypotension: No Has patient had a PCN reaction causing severe rash involving mucus membranes or skin necrosis: No Has patient had a PCN reaction that required hospitalization: No Has patient had a PCN reaction occurring within the last 10 years: No If all of the above answers are "NO", then may proceed with Cephalosporin use.     Current Outpatient Medications on File Prior to Visit  Medication Sig Dispense Refill  . aspirin EC 81 MG tablet Take 81 mg by mouth daily.     Marland Kitchen atorvastatin (LIPITOR) 80 MG tablet Take 1 tablet (80 mg total) by mouth at bedtime. 90 tablet 1  . lamoTRIgine (LAMICTAL) 150 MG tablet Take 1 tablet (150 mg total) by mouth 2 (two) times daily. 180 tablet 3  . levocetirizine (XYZAL) 5 MG tablet Take 1 tablet (5 mg total) by mouth every evening. 30 tablet 3   No current facility-administered medications on file prior to visit.     BP 135/75   Pulse 78   Temp 98.2 F (36.8 C) (Oral)   Resp 16   Ht 5\' 8"  (1.727 m)   Wt 160 lb 6.4 oz (72.8 kg)   SpO2 100%   BMI 24.39 kg/m      Objective:   Physical Exam  General  Mental Status - Alert. General Appearance - Well groomed. Not in acute distress.  Skin Rashes- No Rashes.  HEENT Head- Normal. Ear Auditory  Canal - Left- Normal. Right - Normal.Tympanic Membrane- Left- Normal. Right- Normal. Eye Sclera/Conjunctiva- Left- Normal. Right- Normal. Nose & Sinuses Nasal Mucosa- Left-  Boggy and Congested. Right-  Boggy and  Congested.Bilateral maxillary and frontal sinus pressure. Mouth & Throat Lips: Upper Lip- Normal: no dryness, cracking, pallor, cyanosis, or vesicular eruption. Lower Lip-Normal: no dryness, cracking, pallor,  cyanosis or vesicular eruption. Buccal Mucosa- Bilateral- No Aphthous ulcers. Oropharynx- No Discharge or Erythema. Tonsils: Characteristics- Bilateral- No Erythema or Congestion. Size/Enlargement- Bilateral- No enlargement. Discharge- bilateral-None.  Neck Neck- Supple. No Masses.   Chest and Lung Exam Auscultation: Breath Sounds:-expiratory wheezing heard bilaterally.  even and unlabored.  Cardiovascular Auscultation:Rythm- Regular, rate and rhythm. Murmurs & Other Heart Sounds:Ausculatation of the heart reveal- No Murmurs.  Lymphatic Head & Neck General Head & Neck Lymphatics: Bilateral: Description- No Localized lymphadenopathy.       Assessment & Plan:  You do have persisting bronchitis symptoms over the last 3 weeks.  At this point do want to get chest x-ray to make sure no pneumonia present.  I did go ahead and send a azithromycin antibiotic to your pharmacy.  Please wait for the chest x-ray report via my chart.  If no pneumonia seen then a azithromycin would be a good option.  If pneumonia is shown then would have to give a stronger antibiotic.  For cough, I prescribed Hycodan cough syrup.  This is very strong cough medication and sedation as a side effect.  You might find it most beneficial just to use it at night to help you sleep.   For nasal congestion, I prescribed Flonase nasal spray.  This is over-the-counter but may be cheaper with a prescription.  For wheezing, I did decide to go ahead and give you 6-day taper dose of prednisone due to active  wheeze on exam.  Also you are heading out of town traveling to [place].  Continue to use albuterol as needed for wheezing.  Also making Flovent steroid inhaler available.  If you notice any residual wheezing on approximately day 5 of prednisone then would recommend going ahead and starting Flovent daily for at least 3 weeks.  Follow-up in 3 to 4 weeks or as needed.  [name], PA-C

## 2018-06-10 NOTE — Telephone Encounter (Signed)
Rx levofloxin and benzonatate sent to pt pharmacy.  Notified pt on his cxr result/bonchopneumonia. Explained to pt not to fill the azithromycin. Also called pharmacy and told them to cancel the azithromycin. Giving levofloxin rx.   Pharmacist stated will cancel zpack.   Explained will repeat chest xray in 3 weeks.

## 2018-06-10 NOTE — Telephone Encounter (Signed)
[facility]  [facility]  [place]. [id number]  [phone]  Daniel Chan.MD   Pt seen in December. Can we call and get them to fax over note. Pt is in office today for follow up. Still sick with some similar symptoms.

## 2018-06-10 NOTE — Patient Instructions (Signed)
You do have persisting bronchitis symptoms over the last 3 weeks.  At this point do want to get chest x-ray to make sure no pneumonia present.  I did go ahead and send a azithromycin antibiotic to your pharmacy.  Please wait for the chest x-ray report via my chart.  If no pneumonia seen then a azithromycin would be a good option.  If pneumonia is shown then would have to give a stronger antibiotic.  For cough, I prescribed Hycodan cough syrup.  This is very strong cough medication and sedation as a side effect.  You might find it most beneficial just to use it at night to help you sleep.   For nasal congestion, I prescribed Flonase nasal spray.  This is over-the-counter but may be cheaper with a prescription.  For wheezing, I did decide to go ahead and give you 6-day taper dose of prednisone due to active wheeze on exam.  Also you are heading out of town traveling to [place].  Continue to use albuterol as needed for wheezing.  Also making Flovent steroid inhaler available.  If you notice any residual wheezing on approximately day 5 of prednisone then would recommend going ahead and starting Flovent daily for at least 3 weeks.  Follow-up in 3 to 4 weeks or as needed.

## 2018-06-11 NOTE — Telephone Encounter (Signed)
[facility] states they did not receive release. Release faxed again.

## 2018-06-24 ENCOUNTER — Ambulatory Visit: Payer: Self-pay | Admitting: *Deleted

## 2018-06-24 DIAGNOSIS — K5900 Constipation, unspecified: Secondary | ICD-10-CM | POA: Diagnosis not present

## 2018-06-24 NOTE — Telephone Encounter (Signed)
   Reason for Disposition . [1] Rectal pain or fullness from fecal impaction (rectum full of stool) AND [2] NOT better after SITZ bath, suppository or enema    Patient reports he has used 2 enemas without significant movement or relief- patient reports bloating and discomfort with no BM. Patient is out of state and will not be back for 2 days. Recommend patient get evaluated at [facility] before he tries to make long drive home- number to [facility] given for follow up if needed after UC visit. Patient to call back if needed.  Answer Assessment - Initial Assessment Questions 1. STOOL PATTERN OR FREQUENCY: "How often do you pass bowel movements (BMs)?"  (Normal range: tid to q 3 days)  "When was the last BM passed?"       Every 3 days 2. STRAINING: "Do you have to strain to have a BM?"      Patient does have to strain at the beginning- he does have urge 3. RECTAL PAIN: "Does your rectum hurt when the stool comes out?" If so, ask: "Do you have hemorrhoids? How bad is the pain?"  (Scale 1-10; or mild, moderate, severe)     No history of hemorrhoids but he does feel he has developed one 4. STOOL COMPOSITION: "Are the stools hard?"      yes 5. BLOOD ON STOOLS: "Has there been any blood on the toilet tissue or on the surface of the BM?" If so, ask: "When was the last time?"      No blood 6. CHRONIC CONSTIPATION: "Is this a new problem for you?"  If no, ask: "How long have you had this problem?" (days, weeks, months)      Never had this type of constipation 7. CHANGES IN DIET: "Have there been any recent changes in your diet?"      Patient is on vacation- but reports small variances in diet 8. MEDICATIONS: "Have you been taking any new medications?"     Yes- diagnosed with pneumonia taking antibiotic, inhaler and nasal spray 9. LAXATIVES: "Have you been using any laxatives or enemas?"  If yes, ask "What, how often, and when was the last time?"     Senakot, magnesium citrate, 2 enema 10. CAUSE: "What do you  think is causing the constipation?"         Not sure 11. OTHER SYMPTOMS: "Do you have any other symptoms?" (e.g., abdominal pain, fever, vomiting)       Bloating, passing some gas, small pellets of stoolwere passed 12. PREGNANCY: "Is there any chance you are pregnant?" "When was your last menstrual period?"       n/n  Protocols used: CONSTIPATION-A-AH

## 2018-07-02 ENCOUNTER — Encounter: Payer: Self-pay | Admitting: Medical

## 2018-07-02 ENCOUNTER — Ambulatory Visit (INDEPENDENT_AMBULATORY_CARE_PROVIDER_SITE_OTHER): Payer: PPO | Admitting: Medical

## 2018-07-02 ENCOUNTER — Ambulatory Visit (HOSPITAL_BASED_OUTPATIENT_CLINIC_OR_DEPARTMENT_OTHER)
Admission: RE | Admit: 2018-07-02 | Discharge: 2018-07-02 | Disposition: A | Discharge status: Home / Self Care | Payer: PPO | Source: Ambulatory Visit | Attending: Medical | Admitting: Medical

## 2018-07-02 VITALS — BP 117/62 | HR 67 | Temp 97.6°F | Resp 16 | Ht 67.0 in | Wt 156.0 lb

## 2018-07-02 DIAGNOSIS — Z8701 Personal history of pneumonia (recurrent): Secondary | ICD-10-CM | POA: Insufficient documentation

## 2018-07-02 DIAGNOSIS — K5900 Constipation, unspecified: Secondary | ICD-10-CM

## 2018-07-02 DIAGNOSIS — R109 Unspecified abdominal pain: Secondary | ICD-10-CM | POA: Diagnosis not present

## 2018-07-02 DIAGNOSIS — J189 Pneumonia, unspecified organism: Secondary | ICD-10-CM | POA: Diagnosis not present

## 2018-07-02 DIAGNOSIS — J069 Acute upper respiratory infection, unspecified: Secondary | ICD-10-CM | POA: Diagnosis not present

## 2018-07-02 LAB — CBC WITH DIFFERENTIAL/PLATELET
Basophils Absolute: 0 10*3/uL (ref 0.0–0.1)
Basophils Relative: 0.6 % (ref 0.0–3.0)
Eosinophils Absolute: 0.1 10*3/uL (ref 0.0–0.7)
Eosinophils Relative: 1.9 % (ref 0.0–5.0)
HCT: 40 % (ref 39.0–52.0)
Hemoglobin: 13.1 g/dL (ref 13.0–17.0)
Lymphocytes Relative: 11.7 % — ABNORMAL LOW (ref 12.0–46.0)
Lymphs Abs: 0.8 10*3/uL (ref 0.7–4.0)
MCHC: 32.8 g/dL (ref 30.0–36.0)
MCV: 95.5 fl (ref 78.0–100.0)
Monocytes Absolute: 0.8 10*3/uL (ref 0.1–1.0)
Monocytes Relative: 10.9 % (ref 3.0–12.0)
Neutro Abs: 5.4 10*3/uL (ref 1.4–7.7)
Neutrophils Relative %: 74.9 % (ref 43.0–77.0)
Platelets: 159 10*3/uL (ref 150.0–400.0)
RBC: 4.19 Mil/uL — ABNORMAL LOW (ref 4.22–5.81)
RDW: 13.9 % (ref 11.5–15.5)
WBC: 7.3 10*3/uL (ref 4.0–10.5)

## 2018-07-02 LAB — COMPREHENSIVE METABOLIC PANEL
ALT: 16 U/L (ref 0–53)
AST: 18 U/L (ref 0–37)
Albumin: 4.2 g/dL (ref 3.5–5.2)
Alkaline Phosphatase: 74 U/L (ref 39–117)
BUN: 21 mg/dL (ref 6–23)
CO2: 29 mEq/L (ref 19–32)
Calcium: 9.9 mg/dL (ref 8.4–10.5)
Chloride: 105 mEq/L (ref 96–112)
Creatinine, Ser: 1.13 mg/dL (ref 0.40–1.50)
GFR: 64.26 mL/min (ref >60.00)
Glucose, Bld: 94 mg/dL (ref 70–99)
Potassium: 4.9 mEq/L (ref 3.5–5.1)
Sodium: 141 mEq/L (ref 135–145)
Total Bilirubin: 0.9 mg/dL (ref 0.2–1.2)
Total Protein: 6.8 g/dL (ref 6.0–8.3)

## 2018-07-02 NOTE — Progress Notes (Signed)
Subjective:    Patient ID: Hunter Vega, male    DOB: 04/10/1951, 68 y.o.   MRN: [id number]  HPI  Pt in follow up from last visit. Cxr showed pneumonia.  Pt was feeling progressively better. He was having less cough, less wheezing and no fever. I had written him levofloxin antibiotic. He started antibiotic on 13th. After day 10 his lungs felt well. He had no fever, no chills or sweats.    Then about one week ago he got nasal congestion and runny nose. Mild green mucus when blows his nose. He has no st. 9 grandchildren. He states  visited with several of grand kids and many were sick. One had strep throat. He has no st at all.  Pt states he had some constipation. He states over past 12 days he is only having small bowl movements. He tried miralax, senokot, and magnesium citrate. He states milk of magnesia seemed to help the most. Pt states he went to UC.in [place]. He states xray done on Jan 27,2019. Next day he tired milk of magnesium. He states history of intermittent constipation mostly when travels. He states in past his bowel pattern is about one time every 3 days. Pt does have history of bilateral hernia repairs in past. No other surgeries.  Pt states appetite little decreased little.   Last bm today and had 3 small stools about size of 1/3 golf ball.( Approximation)  8 years had colonoscopy. It was clear and no family history.   Review of Systems  Constitutional: Negative for chills, fatigue and fever.  HENT: Positive for congestion and postnasal drip. Negative for sinus pressure, sinus pain, sore throat and trouble swallowing.   Respiratory: Negative for chest tightness, shortness of breath and wheezing.   Cardiovascular: Negative for chest pain and palpitations.  Gastrointestinal: Positive for abdominal pain and constipation. Negative for nausea and vomiting.  Musculoskeletal: Negative for back pain.  Skin: Negative for rash.  Neurological: Negative for dizziness, seizures,  speech difficulty, weakness and light-headedness.  Hematological: Negative for adenopathy. Does not bruise/bleed easily.  Psychiatric/Behavioral: Negative for behavioral problems, confusion, dysphoric mood and sleep disturbance. The patient is not nervous/anxious.     Past Medical History:  Diagnosis Date  . History of seizure    reports no seizure since 2015   . Hx of exercise stress test    reports  had done to eval since coronary artery stent was placed ; reports test was "normal"   . Hyperlipidemia   . PSVT (paroxysmal supraventricular tachycardia) ([place])    reports no knowledge of this   . Sleep apnea    reports no longer has sicne losing weight      Social History   Socioeconomic History  . Marital status: Married    Spouse name: Not on file  . Number of children: Not on file  . Years of education: Not on file  . Highest education level: Not on file  Occupational History  . Not on file  Social Needs  . Financial resource strain: Not on file  . Food insecurity:    Worry: Not on file    Inability: Not on file  . Transportation needs:    Medical: Not on file    Non-medical: Not on file  Tobacco Use  . Smoking status: Never Smoker  . Smokeless tobacco: Never Used  Substance and Sexual Activity  . Alcohol use: No  . Drug use: No  . Sexual activity: Yes  Lifestyle  .  Physical activity:    Days per week: Not on file    Minutes per session: Not on file  . Stress: Not on file  Relationships  . Social connections:    Talks on phone: Not on file    Gets together: Not on file    Attends religious service: Not on file    Active member of club or organization: Not on file    Attends meetings of clubs or organizations: Not on file    Relationship status: Not on file  . Intimate partner violence:    Fear of current or ex partner: Not on file    Emotionally abused: Not on file    Physically abused: Not on file    Forced sexual activity: Not on file  Other Topics  Concern  . Not on file  Social History Narrative   Lives with wife   Caffeine use: 1 cup corfee daily   Left handed     Past Surgical History:  Procedure Laterality Date  . CARDIAC CATHETERIZATION    . CORONARY ANGIOPLASTY    . CORONARY STENT PLACEMENT     x2. Mid LAD  . INGUINAL HERNIA REPAIR Bilateral 09/26/2017   Procedure: LAPAROSCOPIC BILATERAL INGUINAL HERNIA REPAIR;  Surgeon: Michael Boston, MD;  Location: [facility];  Service: General;  Laterality: Bilateral;  . INSERTION OF MESH Bilateral 09/26/2017   Procedure: INSERTION OF MESH;  Surgeon: Michael Boston, MD;  Location: [facility];  Service: General;  Laterality: Bilateral;    Family History  Problem Relation Age of Onset  . Liver disease Mother   . Heart attack Father   . Diabetes Paternal Uncle     Allergies  Allergen Reactions  . Penicillins Swelling    Arm swelling Has patient had a PCN reaction causing immediate rash, facial/tongue/throat swelling, SOB or lightheadedness with hypotension: No Has patient had a PCN reaction causing severe rash involving mucus membranes or skin necrosis: No Has patient had a PCN reaction that required hospitalization: No Has patient had a PCN reaction occurring within the last 10 years: No If all of the above answers are "NO", then may proceed with Cephalosporin use.     Current Outpatient Medications on File Prior to Visit  Medication Sig Dispense Refill  . aspirin EC 81 MG tablet Take 81 mg by mouth daily.     Marland Kitchen atorvastatin (LIPITOR) 80 MG tablet Take 1 tablet (80 mg total) by mouth at bedtime. 90 tablet 1  . fluticasone (FLONASE) 50 MCG/ACT nasal spray Place 2 sprays into both nostrils daily. 16 g 1  . fluticasone (FLOVENT HFA) 110 MCG/ACT inhaler Inhale 2 puffs into the lungs 2 (two) times daily. 1 Inhaler 3  . lamoTRIgine (LAMICTAL) 150 MG tablet Take 1 tablet (150 mg total) by mouth 2 (two) times daily. 180 tablet 3  . levocetirizine (XYZAL) 5 MG tablet Take 1 tablet (5 mg total) by  mouth every evening. 30 tablet 3  . benzonatate (TESSALON) 100 MG capsule Take 1 capsule (100 mg total) by mouth 3 (three) times daily as needed for cough. Mild cough during day.(not to use with hycodan) (Patient not taking: Reported on 07/02/2018) 21 capsule 0  . HYDROcodone-homatropine (HYCODAN) 5-1.5 MG/5ML syrup Take 5 mLs by mouth every 6 (six) hours as needed for cough. (Patient not taking: Reported on 07/02/2018) 100 mL 0   No current facility-administered medications on file prior to visit.     BP 117/62   Pulse 67   Temp 97.6  F (36.4 C)   Resp 16   Ht 5\' 7"  (1.702 m)   Wt 156 lb (70.8 kg)   SpO2 100%   BMI 24.43 kg/m       Objective:   Physical Exam  General  Mental Status - Alert. General Appearance - Well groomed. Not in acute distress.  Skin Rashes- No Rashes.  HEENT Head- Normal. Ear Auditory Canal - Left- Normal. Right - Normal.Tympanic Membrane- Left- Normal. Right- Normal. Eye Sclera/Conjunctiva- Left- Normal. Right- Normal. Nose & Sinuses Nasal Mucosa- Left-  Boggy and Congested. Right-  Boggy and  Congested.Bilateral maxillary and frontal sinus pressure. Mouth & Throat Lips: Upper Lip- Normal: no dryness, cracking, pallor, cyanosis, or vesicular eruption. Lower Lip-Normal: no dryness, cracking, pallor, cyanosis or vesicular eruption. Buccal Mucosa- Bilateral- No Aphthous ulcers. Oropharynx- No Discharge or Erythema. Tonsils: Characteristics- Bilateral- No Erythema or Congestion. Size/Enlargement- Bilateral- No enlargement. Discharge- bilateral-None.  Neck Neck- Supple. No Masses.   Chest and Lung Exam Auscultation: Breath Sounds:-Clear even and unlabored.  Cardiovascular Auscultation:Rythm- Regular, rate and rhythm. Murmurs & Other Heart Sounds:Ausculatation of the heart reveal- No Murmurs.  Lymphatic Head & Neck General Head & Neck Lymphatics: Bilateral: Description- No Localized lymphadenopathy.   . Abdomen Inspection:-Inspection Normal.    Palpation/Perucssion: Palpation and Percussion of the abdomen reveal- faint rt lower Tender, No Rebound tenderness, No rigidity(Guarding) and No Palpable abdominal masses.  Liver:-Normal.  Spleen:- Normal.  No rt lower ext heal jar. No pain on rotation of rt lower ext/negative obturator sign.  Back- no cva tenderness.      Assessment & Plan:  You clinically did not appear to get better from your pneumonia.  But we do need to get chest x-ray to verify clearing of prior areas of infection.  About 1 week your appears that you got upper respiratory infection following exposure to various grandchildren with likely upper respiratory type symptoms.  I do want to use Flonase daily to decrease nasal congestion.  Presently not convinced that you have any a sinus infection.  But please watch and and notify us if you start to get severe sinus pressure as we discussed.  You had some moderate to severe intermittent constipation over the last 12 days.  6-day since any significant bowel movement.  Recent use various over-the-counter medications to typically do promote good bowel movement.  Particularly magnesium citrate.  I will get abdomen view x-ray today to assess amount of stool present.  Make sure no indication of bowel dilation.  If any indication of possible obstruction then might need to do CT scan.  You do also notes some right lower quadrant region pain.  No hernia found on examination of both inguinal canals.  Do want to get CBC and metabolic panel.  Good idea to go ahead and check your WBCs presently.  If right lower quadrant pain worsens let me know.  In that event would need to get CT abdomen as well.  Follow-up date to be determined after lab review.  Advice on medication to help with a bowel movement pending evaluation of abdomen x-ray.  40 minutes spent with pt 50% of time  spent counseling on post pneumonia evaluation, recent new uri, constipation and rt lower quadrant pain.  [name], PA-C

## 2018-07-02 NOTE — Patient Instructions (Signed)
You clinically did not appear to get better from your pneumonia.  But we do need to get chest x-ray to verify clearing of prior areas of infection.  About 1 week your appears that you got upper respiratory infection following exposure to various grandchildren with likely upper respiratory type symptoms.  I do want to use Flonase daily to decrease nasal congestion.  Presently not convinced that you have any a sinus infection.  But please watch and and notify us if you start to get severe sinus pressure as we discussed.  You had some moderate to severe intermittent constipation over the last 12 days.  6-day since any significant bowel movement.  Recent use various over-the-counter medications to typically do promote good bowel movement.  Particularly magnesium citrate.  I will get abdomen view x-ray today to assess amount of stool present.  Make sure no indication of bowel dilation.  If any indication of possible obstruction then might need to do CT scan.  You do also notes some right lower quadrant region pain.  No hernia found on examination of both inguinal canals.  Do want to get CBC and metabolic panel.  Good idea to go ahead and check your WBCs presently.  If right lower quadrant pain worsens let me know.  In that event would need to get CT abdomen as well.  Follow-up date to be determined after lab review.  Advice on medication to help with a bowel movement pending evaluation of abdomen x-ray.

## 2018-07-03 ENCOUNTER — Encounter: Payer: Self-pay | Admitting: Medical

## 2018-07-04 ENCOUNTER — Telehealth: Payer: Self-pay | Admitting: Medical

## 2018-07-04 ENCOUNTER — Telehealth: Payer: Self-pay

## 2018-07-04 MED ORDER — LINACLOTIDE 145 MCG PO CAPS: 145.0000 ug | ORAL_CAPSULE | Freq: Every day | ORAL | 0 refills | Status: DC

## 2018-07-04 MED ORDER — BENZONATATE 100 MG PO CAPS: 100.0000 mg | ORAL_CAPSULE | Freq: Three times a day (TID) | ORAL | 0 refills | Status: DC | PRN

## 2018-07-04 NOTE — Telephone Encounter (Signed)
Copied from [place] [id number]. Topic: General - Other >> Jul 03, 2018  8:32 AM Oneta Rack wrote: Relation to pt: self  Call back number: [phone]   Reason for call:  Patient was advised by PA to take 4 ounces of warm prune juice and magnesium. Patient states he had a small bowel movement yesterday and 1 small bowel this morning, patient would like to discuss further with nurse and would like PA to review MyChart message and advise via telephone.

## 2018-07-04 NOTE — Telephone Encounter (Signed)
Answered his my chart message.

## 2018-07-04 NOTE — Telephone Encounter (Signed)
Rx linzess and benzonatate sent to pt pharmacy.

## 2018-07-11 ENCOUNTER — Encounter: Payer: Self-pay | Admitting: Medical

## 2018-07-11 ENCOUNTER — Other Ambulatory Visit (HOSPITAL_BASED_OUTPATIENT_CLINIC_OR_DEPARTMENT_OTHER): Payer: Self-pay | Admitting: Internal Medicine

## 2018-07-11 ENCOUNTER — Ambulatory Visit (HOSPITAL_BASED_OUTPATIENT_CLINIC_OR_DEPARTMENT_OTHER)
Admission: RE | Admit: 2018-07-11 | Discharge: 2018-07-11 | Disposition: A | Discharge status: Home / Self Care | Payer: PPO | Source: Ambulatory Visit | Attending: Medical | Admitting: Medical

## 2018-07-11 ENCOUNTER — Encounter (HOSPITAL_BASED_OUTPATIENT_CLINIC_OR_DEPARTMENT_OTHER): Payer: Self-pay

## 2018-07-11 ENCOUNTER — Ambulatory Visit (INDEPENDENT_AMBULATORY_CARE_PROVIDER_SITE_OTHER): Payer: PPO | Admitting: Medical

## 2018-07-11 VITALS — BP 127/60 | HR 67 | Temp 98.1°F | Resp 16 | Ht 67.0 in | Wt 155.8 lb

## 2018-07-11 DIAGNOSIS — R110 Nausea: Secondary | ICD-10-CM | POA: Diagnosis not present

## 2018-07-11 DIAGNOSIS — K5900 Constipation, unspecified: Secondary | ICD-10-CM | POA: Diagnosis not present

## 2018-07-11 DIAGNOSIS — R1031 Right lower quadrant pain: Secondary | ICD-10-CM | POA: Diagnosis not present

## 2018-07-11 DIAGNOSIS — R109 Unspecified abdominal pain: Secondary | ICD-10-CM | POA: Diagnosis not present

## 2018-07-11 DIAGNOSIS — R634 Abnormal weight loss: Secondary | ICD-10-CM | POA: Diagnosis not present

## 2018-07-11 LAB — AMYLASE: Amylase: 40 U/L (ref 27–131)

## 2018-07-11 LAB — CBC WITH DIFFERENTIAL/PLATELET
Basophils Absolute: 0.1 10*3/uL (ref 0.0–0.1)
Basophils Relative: 0.9 % (ref 0.0–3.0)
Eosinophils Absolute: 0.3 10*3/uL (ref 0.0–0.7)
Eosinophils Relative: 4.2 % (ref 0.0–5.0)
HCT: 39.2 % (ref 39.0–52.0)
Hemoglobin: 12.9 g/dL — ABNORMAL LOW (ref 13.0–17.0)
Lymphocytes Relative: 16 % (ref 12.0–46.0)
Lymphs Abs: 1.1 10*3/uL (ref 0.7–4.0)
MCHC: 32.8 g/dL (ref 30.0–36.0)
MCV: 95.2 fl (ref 78.0–100.0)
Monocytes Absolute: 0.7 10*3/uL (ref 0.1–1.0)
Monocytes Relative: 10.6 % (ref 3.0–12.0)
Neutro Abs: 4.7 10*3/uL (ref 1.4–7.7)
Neutrophils Relative %: 68.3 % (ref 43.0–77.0)
Platelets: 187 10*3/uL (ref 150.0–400.0)
RBC: 4.12 Mil/uL — ABNORMAL LOW (ref 4.22–5.81)
RDW: 13.7 % (ref 11.5–15.5)
WBC: 6.9 10*3/uL (ref 4.0–10.5)

## 2018-07-11 LAB — COMPREHENSIVE METABOLIC PANEL
ALT: 12 U/L (ref 0–53)
AST: 18 U/L (ref 0–37)
Albumin: 4 g/dL (ref 3.5–5.2)
Alkaline Phosphatase: 82 U/L (ref 39–117)
BUN: 13 mg/dL (ref 6–23)
CO2: 31 mEq/L (ref 19–32)
Calcium: 9.4 mg/dL (ref 8.4–10.5)
Chloride: 105 mEq/L (ref 96–112)
Creatinine, Ser: 1.13 mg/dL (ref 0.40–1.50)
GFR: 64.25 mL/min (ref >60.00)
Glucose, Bld: 97 mg/dL (ref 70–99)
Potassium: 4.9 mEq/L (ref 3.5–5.1)
Sodium: 143 mEq/L (ref 135–145)
Total Bilirubin: 0.6 mg/dL (ref 0.2–1.2)
Total Protein: 6.5 g/dL (ref 6.0–8.3)

## 2018-07-11 LAB — LIPASE: Lipase: 19 U/L (ref 11.0–59.0)

## 2018-07-11 MED ORDER — IOPAMIDOL (ISOVUE-300) INJECTION 61%
100.0000 mL | Freq: Once | INTRAVENOUS | Status: AC | PRN
  Administered 2018-07-11: 100 mL via INTRAVENOUS

## 2018-07-11 NOTE — Patient Instructions (Signed)
For your history of recent chronic intermittent bouts of constipation, I do want you to stick with conservative regimen of milk of magnesia and prune juice if that continues to work.  You could use this every or 2 or 3 days before you start to feel severe constipation or bloating.  Might consider use of Linzess in the future but hold off on that presently.  I did go ahead and make referral to former GI office.  Asking on the referral request that they can see you next week.  I did place on the referral that I think is okay for you to see the PA in the office as back get expedite you being seen and move up the date of potential procedure/colonoscopy.  I do want to get a CBC, CMP, amylase and lipase today.  I did place order to get a CT abdomen and pelvis.  I am going to ask referral coordinator to see if they can get that scheduled today or tomorrow.  She will need to check on insurance prior authorization first.  Follow-up date to be determined with me after lab and imaging review.

## 2018-07-11 NOTE — Progress Notes (Signed)
Subjective:    Patient ID: Hunter Vega, male    DOB: 08-03-1950, 68 y.o.   MRN: [id number]  HPI   Below is hpi from last visit.   "Pt states he had some constipation. He states over past 12 days he is only having small bowl movements. He tried miralax, senokot, and magnesium citrate. He states milk of magnesia seemed to help the most. Pt states he went to UC.in [place]. He states xray done on Jan 27,2019. Next day he tired milk of magnesium. He states history of intermittent constipation mostly when travels. He states in past his bowel pattern is about one time every 3 days. Pt does have history of bilateral hernia repairs in past. No other surgeries.  Pt states appetite little decreased little.   Last bm today and had 3 small stools about size of 1/3 golf ball.( Approximation)  8 years had colonoscopy. It was clear and no family history."  Xray on 07-02-2018 showed. IMPRESSION: Diffuse stool throughout colon, a finding indicative of constipation. No bowel obstruction or free air evident.  After I got xray back recommended  combo of 4 oz prune juice, 2 tablespoon  mild of magnesia and then later dulcolax supp. He noted back around 07-03-2018 decent bm. Later I sent in linzess for chronic constipation.  Pt today notes some weight loss. From last visit. He lost 1 lb. From last month he lost 4 lb.   Pt in past had been on narcotic cough medl and I recommended not to use on my chart message as that can cause constipation.  Last colonoscopy 2011.  Pt states since last my chart conversation he states symptoms were resolved after 5 days with mom and prune juice. He has some rt lower quadrant tenderness when he bends over. He had repair of hernia in past post surgery. He had mild swelling post surgery. Surgeon told swelling would go down.  Last bm was yesterday was good and he states cleaned him out.  I had sent him in some linzess since his constipation was beginning to be chronic. He  never filled the med. At this point he wants to find out cause of constipation. He expresses frustration about why he has constipation.  Formerly had some mild left side constipation but rt side still persists.  No fever, no chills or sweats. Faint nausea on recent 4th day of constipation.     Review of Systems  Constitutional: Negative for chills, fatigue and fever.  Respiratory: Negative for cough, chest tightness, shortness of breath and wheezing.   Cardiovascular: Negative for chest pain and palpitations.  Gastrointestinal: Positive for abdominal pain and constipation. Negative for abdominal distention, blood in stool, diarrhea, rectal pain and vomiting.       Dark brown stools but no black or blood stools described.  Genitourinary: Negative for difficulty urinating, frequency, hematuria and testicular pain.  Musculoskeletal: Negative for back pain, joint swelling and neck pain.  Skin: Negative for rash.  Neurological: Negative for dizziness and headaches.  Hematological: Negative for adenopathy. Does not bruise/bleed easily.  Psychiatric/Behavioral: Negative for behavioral problems, confusion and suicidal ideas. The patient is not nervous/anxious.        Objective:   Physical Exam  General Appearance- Not in acute distress.  HEENT Eyes- Scleraeral/Conjuntiva-bilat- Not Yellow. Mouth & Throat- Normal.  Chest and Lung Exam Auscultation: Breath sounds:-Normal. Adventitious sounds:- No Adventitious sounds.  Cardiovascular Auscultation:Rythm - Regular. Heart Sounds -Normal heart sounds.  Abdomen Inspection:-Inspection Normal.  Palpation/Perucssion: Palpation and  Percussion of the abdomen reveal- Non Tender, No Rebound tenderness, No rigidity(Guarding) and No Palpable abdominal masses.  Liver:-Normal.  Spleen:- Normal.   Back- no cva tenderness      Assessment & Plan:  For your history of recent chronic intermittent bouts of constipation, I do want you to stick with  conservative regimen of milk of magnesia and prune juice if that continues to work.  You could use this every or 2 or 3 days before you start to feel severe constipation or bloating.  Might consider use of Linzess in the future but hold off on that presently.  I did go ahead and make referral to former GI office.  Asking on the referral request that they can see you next week.  I did place on the referral that I think is okay for you to see the PA in the office as back get expedite you being seen and move up the date of potential procedure/colonoscopy.  I do want to get a CBC, CMP, amylase and lipase today.  I did place order to get a CT abdomen and pelvis.  I am going to ask referral coordinator to see if they can get that scheduled today or tomorrow.  She will need to check on insurance prior authorization first.  Follow-up date to be determined with me after lab and imaging review.  40 minutes spent with pt. 50% of time counseling on plan going forward and approach to treatment, work up and referral.  [name], PA-C

## 2018-07-18 ENCOUNTER — Other Ambulatory Visit (INDEPENDENT_AMBULATORY_CARE_PROVIDER_SITE_OTHER): Payer: PPO

## 2018-07-18 ENCOUNTER — Ambulatory Visit (INDEPENDENT_AMBULATORY_CARE_PROVIDER_SITE_OTHER): Payer: PPO | Admitting: Physician Assistant

## 2018-07-18 ENCOUNTER — Encounter: Payer: Self-pay | Admitting: Physician Assistant

## 2018-07-18 VITALS — BP 128/70 | HR 72 | Ht 68.0 in | Wt 154.4 lb

## 2018-07-18 DIAGNOSIS — R1031 Right lower quadrant pain: Secondary | ICD-10-CM

## 2018-07-18 DIAGNOSIS — R140 Abdominal distension (gaseous): Secondary | ICD-10-CM

## 2018-07-18 DIAGNOSIS — K5900 Constipation, unspecified: Secondary | ICD-10-CM

## 2018-07-18 LAB — TSH: TSH: 3.47 u[IU]/mL (ref 0.35–4.50)

## 2018-07-18 NOTE — Patient Instructions (Signed)
Your provider has requested that you go to the basement level for lab work before leaving today. Press "B" on the elevator. The lab is located at the first door on the left as you exit the elevator.  Start Benefiber 2 tablespoons daily.   Add Miralax 17 grams in 8 oz of water daily if Benefiber help.  Increase your water intake to 60-70 oz daily.   It has been recommended to you by your physician that you have a(n) Colonoscopy completed. Per your request, we did not schedule the procedure(s) today. Please contact our office at [phone] should you decide to have the procedure completed.

## 2018-07-18 NOTE — Progress Notes (Signed)
Subjective:    Patient ID: Hunter Vega, male    DOB: 1950/10/08, 68 y.o.   MRN: [id number]  HPI Hunter Vega is a 68 year old white male, referred today by [name] PA consideration of colonoscopy and evaluation of recent complaints of abdominal pain and constipation. Patient is known to Dr. [name] from colonoscopy done in 2011 with finding of external hemorrhoids and mild sigmoid diverticulosis. Patient says his current symptoms started towards the end of January with change in bowel habits with small bowel movements.  He tried several over-the-counter preparations without much success and found milk of magnesia to be most helpful.  He says generally he has not had any issues with constipation or problems with his bowels throughout his life other than occasionally with travel.  [place]ell regimen is to have a bowel movement every 3 days but says usually passes a large amount of stool at that time.  Now he has been going 4 to 5 days between bowel movements.  He has been taking milk of magnesia 30 cc once every 4 days as needed with good results.  He has not noted any melena or hematochezia.  Appetite has been fine, weight has been stable, now actually up 2 pounds.  Is been no nausea or vomiting. Pt was treated for pneumonia several weeks ago and had been on several medications during that episode but says he is been off of all of those things over the past couple of weeks. Primary care did labs on 07/02/2018 with normal CBC, lipase and c-Met. CT of the abdomen and pelvis on 07/11/2018 noted normal appendix, normal colon and small bowel there was increased retained stool in the colon multiple level degenerative disc disease in the lumbar spine.  Patient reports right inguinal hernia repair done about a year ago.  He says he felt some recurrence of the bulge in his right lower quadrant a few months later that had been back to his surgeon did not feel that he had a recurrent hernia.  Recent CT scan did not  comment on inguinal hernia. Patient says when he is become more constipated he has had some pressure in that right lower quadrant especially with straining.   Review of Systems Pertinent positive and negative review of systems were noted in the above HPI section.  All other review of systems was otherwise negative.  Outpatient Encounter Medications as of 07/18/2018  Medication Sig  . aspirin EC 81 MG tablet Take 81 mg by mouth daily.   Marland Kitchen atorvastatin (LIPITOR) 80 MG tablet Take 1 tablet (80 mg total) by mouth at bedtime.  . lamoTRIgine (LAMICTAL) 150 MG tablet Take 1 tablet (150 mg total) by mouth 2 (two) times daily.  . fluticasone (FLONASE) 50 MCG/ACT nasal spray Place 2 sprays into both nostrils daily. (Patient not taking: Reported on 07/18/2018)   No facility-administered encounter medications on file as of 07/18/2018.    Allergies  Allergen Reactions  . Penicillins Swelling    Arm swelling Has patient had a PCN reaction causing immediate rash, facial/tongue/throat swelling, SOB or lightheadedness with hypotension: No Has patient had a PCN reaction causing severe rash involving mucus membranes or skin necrosis: No Has patient had a PCN reaction that required hospitalization: No Has patient had a PCN reaction occurring within the last 10 years: No If all of the above answers are "NO", then may proceed with Cephalosporin use.    Patient Active Problem List   Diagnosis Date Noted  . Bilateral inguinal hernia s/p lap  repair with mesh 09/26/2017 09/26/2017  . Left leg numbness 11/22/2016  . PSVT (paroxysmal supraventricular tachycardia) ([place]) 11/04/2015  . CAD (coronary artery disease) 11/04/2015  . Polypharmacy 12/18/2013  . Partial epilepsy with impairment of consciousness ([place]) 12/18/2013  . Encounter for long-term (current) use of medications 12/18/2013  . Seizure disorder ([place]) 05/10/2011  . Mixed sleep apnea 03/30/2011  . Mixed hyperlipidemia 11/24/2009   Social History    Socioeconomic History  . Marital status: Married    Spouse name: Not on file  . Number of children: 3  . Years of education: Not on file  . Highest education level: Not on file  Occupational History  . Not on file  Social Needs  . Financial resource strain: Not on file  . Food insecurity:    Worry: Not on file    Inability: Not on file  . Transportation needs:    Medical: Not on file    Non-medical: Not on file  Tobacco Use  . Smoking status: Never Smoker  . Smokeless tobacco: Never Used  Substance and Sexual Activity  . Alcohol use: No  . Drug use: No  . Sexual activity: Yes  Lifestyle  . Physical activity:    Days per week: Not on file    Minutes per session: Not on file  . Stress: Not on file  Relationships  . Social connections:    Talks on phone: Not on file    Gets together: Not on file    Attends religious service: Not on file    Active member of club or organization: Not on file    Attends meetings of clubs or organizations: Not on file    Relationship status: Not on file  . Intimate partner violence:    Fear of current or ex partner: Not on file    Emotionally abused: Not on file    Physically abused: Not on file    Forced sexual activity: Not on file  Other Topics Concern  . Not on file  Social History Narrative   Lives with wife   Caffeine use: 1 cup corfee daily   Left handed     Mr. Teters family history includes Diabetes in his paternal uncle; Heart attack in his father; Liver disease in his mother.      Objective:    Vitals:   07/18/18 0817  BP: 128/70  Pulse: 72    Physical Exam; well-developed older white male in no acute distress, height 5 foot 8, weight 154, BMI 23.4.  HEENT ;nontraumatic normocephalic EOMI PERRLA sclera anicteric, oral mucosa moist.  Cardiovascular regular rate and rhythm with S1-S2 no rub or gallop, Pulmonary; clear bilaterally, Abdomen;soft, sounds are present, he is nontender there is no palpable mass or  hepatosplenomegaly.  Rectal ;exam not done.  Extremities ;no clubbing cyanosis or edema skin warm dry, Neuropsych; alert and oriented, grossly nonfocal mood and affect appropriate       Assessment & Plan:   #18 68 year old white male with recent onset of change in bowel habits with mild constipation.  Patient's usual habit had been bowel movement every 3 days and he is now having a bowel movement q. 4 to 5 days.  He has had some associated intermittent pressure in the right lower quadrant.  Recent CT unrevealing  Change in bowel habits may have been precipitated by recent pneumonia and drugs used during that episode.  Rule out hypothyroid Rule out occult lesion  #2 status post right inguinal hernia repair #3  diverticulosis  Plan; check TSH Patient is advised to start Benefiber on a daily basis 2 tablespoons in a glass of water. Continuehigh-fiber diet.  Increase water intake to at least 60 ounces daily. We also discussed using MiraLAX on a more regular basis as needed.  He may use 17 g in 8 ounces of water daily or every other day. Patient will be scheduled for colonoscopy with Dr. [name].  Procedure was discussed in detail with the patient including indications risks and benefits and he is agreeable to proceed.  Amy Genia Harold PA-C 07/18/2018   Cc: [name], PA-C

## 2018-07-19 NOTE — Progress Notes (Signed)
I agree with the above note, plan 

## 2018-07-22 ENCOUNTER — Encounter: Payer: Self-pay | Admitting: Medical

## 2018-07-23 ENCOUNTER — Telehealth: Payer: Self-pay | Admitting: Medical

## 2018-07-23 DIAGNOSIS — Z1283 Encounter for screening for malignant neoplasm of skin: Secondary | ICD-10-CM

## 2018-07-23 DIAGNOSIS — L989 Disorder of the skin and subcutaneous tissue, unspecified: Secondary | ICD-10-CM

## 2018-07-23 NOTE — Telephone Encounter (Signed)
Referral to dermatologist placed. 

## 2018-07-25 ENCOUNTER — Other Ambulatory Visit: Payer: Self-pay | Admitting: Medical

## 2018-07-25 NOTE — Telephone Encounter (Signed)
Copied from [place] [id number]. Topic: Quick Communication - Rx Refill/Question >> Jul 25, 2018 10:11 AM Bea Graff, NT wrote: Medication: atorvastatin (LIPITOR) 80 MG tablet  Has the patient contacted their pharmacy? Yes.   (Agent: If no, request that the patient contact the pharmacy for the refill.) (Agent: If yes, when and what did the pharmacy advise?)  Preferred Pharmacy (with phone number or street name): [place], [place] [phone] (Phone) [phone] (Fax)    Agent: Please be advised that RX refills may take up to 3 business days. We ask that you follow-up with your pharmacy.

## 2018-07-28 DIAGNOSIS — K5909 Other constipation: Secondary | ICD-10-CM | POA: Diagnosis not present

## 2018-07-29 ENCOUNTER — Encounter: Payer: Self-pay | Admitting: Gastroenterology

## 2018-08-02 ENCOUNTER — Telehealth: Payer: Self-pay | Admitting: Medical

## 2018-08-02 ENCOUNTER — Encounter: Payer: Self-pay | Admitting: Medical

## 2018-08-02 NOTE — Telephone Encounter (Signed)
Copied from [place] [id number]. Topic: Referral - Question >> Aug 02, 2018 10:45 AM Burchel, Abbi R wrote: CRM for notification. See Telephone encounter for: 08/02/18.  Pt states that [facility] notified him that their 1st available appt is about 6 weeks out and he would like to discuss sending a referral to a different GI in hopes of being seen sooner.  Please advise.   [phone]

## 2018-08-02 NOTE — Telephone Encounter (Signed)
Patient has already seen local GI and is established.  The way I understand it is no other GI MD with [facility] would see him since he is already established with someone in the group.  If I refer him to someone totally different I think he will not be able to seen any sooner.  Would not recommend switching and starting back at [facility] so to say.  Would you ask him what is going on presently?  I could send a message informing PA at GI of current status and his request to be seen sooner.  Clarify whether or not he is trying to get sooner appointment for colonoscopy?

## 2018-08-02 NOTE — Telephone Encounter (Signed)
Open to review.  

## 2018-08-02 NOTE — Telephone Encounter (Signed)
Spoke with pt and py understands. Pt want to know if Krew remembers where did he refer him to first. Please advise

## 2018-08-03 NOTE — Telephone Encounter (Signed)
I don't remember who I would have tried to refer him to except may have been upstairs with GI?

## 2018-08-12 ENCOUNTER — Telehealth: Payer: Self-pay

## 2018-08-12 NOTE — Telephone Encounter (Signed)
Dr [name] will you please advise in Amy Esterwoods absence Patient calls with complaints of continued constipation. Last "good bowel movement" was 4 days ago. He took MOM to produce that bowel movement. He is passing "stringly " soft bowel movement of "minimum amount." Lot of intestinal gas. Diet is fresh vegetables and fruits, yogurt and prunes alone with other foods. Reports of low abdominal discomfort "like a toothache" and worsens when he lies down. He has some nausea. Afebrile. No blood with stools. He is taking Miralax and fiber. PCP gave him Linzess 145 mcg but he has not actually started taking it yet. Scheduled for colonoscopy 09/20/18.  Please advise.

## 2018-08-13 ENCOUNTER — Encounter: Payer: Self-pay | Admitting: Medical

## 2018-08-13 ENCOUNTER — Ambulatory Visit: Payer: PPO | Admitting: Gastroenterology

## 2018-08-13 NOTE — Telephone Encounter (Signed)
He should start taking the Linzess which he was prescribed.  I suspect that will help.  Work-up to date including CT scan has been unrevealing, hemoglobin 12.9 is reassuring.  Should continue with plans for colonoscopy at his soonest convenience.  Certainly could offer him sooner appointment if he would like.

## 2018-08-13 NOTE — Telephone Encounter (Signed)
Patient and the spouse is advised.

## 2018-08-14 ENCOUNTER — Encounter: Payer: Self-pay | Admitting: Gastroenterology

## 2018-08-14 ENCOUNTER — Other Ambulatory Visit: Payer: Self-pay

## 2018-08-14 ENCOUNTER — Ambulatory Visit (AMBULATORY_SURGERY_CENTER): Payer: Self-pay | Admitting: *Deleted

## 2018-08-14 VITALS — Temp 98.7°F | Ht 67.0 in | Wt 154.8 lb

## 2018-08-14 DIAGNOSIS — R1031 Right lower quadrant pain: Secondary | ICD-10-CM

## 2018-08-14 DIAGNOSIS — K5900 Constipation, unspecified: Secondary | ICD-10-CM

## 2018-08-14 MED ORDER — PEG 3350-KCL-NA BICARB-NACL 420 G PO SOLR: 4000.0000 mL | Freq: Once | ORAL | 0 refills | Status: AC

## 2018-08-14 NOTE — Progress Notes (Signed)
No egg or soy allergy known to patient  No issues with past sedation with any surgeries  or procedures, no intubation problems  No diet pills per patient No home 02 use per patient  No blood thinners per patient  Pt states issues with constipation - pt started Linzess Monday 08-12-2018 for constipation - soft regular this week per pt  No A fib or A flutter  EMMI video sent to pt's e mail

## 2018-08-15 ENCOUNTER — Telehealth: Payer: Self-pay

## 2018-08-15 NOTE — Telephone Encounter (Signed)
Covid-19 travel screening questions  Have you traveled in the last 14 days? No. If yes where?  Do you now or have you had a fever in the last 14 days? No.  Do you have any respiratory symptoms of shortness of breath or cough now or in the last 14 days? No.  Do you have a medical history of Congestive Heart Failure?  Do you have a medical history of lung disease?  Do you have any family members or close contacts with diagnosed or suspected Covid-19? No.      Patient is aware and agrees that care partner will stay in their car and will be called when it is time for discharge.

## 2018-08-16 ENCOUNTER — Encounter: Payer: Self-pay | Admitting: Gastroenterology

## 2018-08-16 ENCOUNTER — Other Ambulatory Visit: Payer: Self-pay

## 2018-08-16 ENCOUNTER — Ambulatory Visit (AMBULATORY_SURGERY_CENTER): Payer: PPO | Admitting: Gastroenterology

## 2018-08-16 VITALS — BP 120/63 | HR 74 | Temp 97.5°F | Resp 13 | Ht 68.0 in | Wt 154.0 lb

## 2018-08-16 DIAGNOSIS — R1031 Right lower quadrant pain: Secondary | ICD-10-CM

## 2018-08-16 DIAGNOSIS — D122 Benign neoplasm of ascending colon: Secondary | ICD-10-CM

## 2018-08-16 DIAGNOSIS — R109 Unspecified abdominal pain: Secondary | ICD-10-CM | POA: Diagnosis not present

## 2018-08-16 DIAGNOSIS — K5900 Constipation, unspecified: Secondary | ICD-10-CM | POA: Diagnosis not present

## 2018-08-16 DIAGNOSIS — I2510 Atherosclerotic heart disease of native coronary artery without angina pectoris: Secondary | ICD-10-CM | POA: Diagnosis not present

## 2018-08-16 MED ORDER — SODIUM CHLORIDE 0.9 % IV SOLN: 500.0000 mL | Freq: Once | INTRAVENOUS | Status: DC

## 2018-08-16 NOTE — Progress Notes (Signed)
Pt's states no medical or surgical changes since previsit or office visit. 

## 2018-08-16 NOTE — Progress Notes (Signed)
Called to room to assist during endoscopic procedure.  Patient ID and intended procedure confirmed with present staff. Received instructions for my participation in the procedure from the performing physician.  

## 2018-08-16 NOTE — Progress Notes (Signed)
Report given to PACU, vss 

## 2018-08-16 NOTE — Patient Instructions (Signed)
YOU HAD AN ENDOSCOPIC PROCEDURE TODAY AT [facility]:   Refer to the procedure report that was given to you for any specific questions about what was found during the examination.  If the procedure report does not answer your questions, please call your gastroenterologist to clarify.  If you requested that your care partner not be given the details of your procedure findings, then the procedure report has been included in a sealed envelope for you to review at your convenience later.  YOU SHOULD EXPECT: Some feelings of bloating in the abdomen. Passage of more gas than usual.  Walking can help get rid of the air that was put into your GI tract during the procedure and reduce the bloating. If you had a lower endoscopy (such as a colonoscopy or flexible sigmoidoscopy) you may notice spotting of blood in your stool or on the toilet paper. If you underwent a bowel prep for your procedure, you may not have a normal bowel movement for a few days.  Please Note:  You might notice some irritation and congestion in your nose or some drainage.  This is from the oxygen used during your procedure.  There is no need for concern and it should clear up in a day or so.  SYMPTOMS TO REPORT IMMEDIATELY:   Following lower endoscopy (colonoscopy or flexible sigmoidoscopy):  Excessive amounts of blood in the stool  Significant tenderness or worsening of abdominal pains  Swelling of the abdomen that is new, acute  Fever of 100F or higher  Please see handouts given to you on Polyps. Please take one dose of OTC powder fiber supplement daily ( citrucel) and stay Hydrated.  For urgent or emergent issues, a gastroenterologist can be reached at any hour by calling [phone].   DIET:  We do recommend a small meal at first, but then you may proceed to your regular diet.  Drink plenty of fluids but you should avoid alcoholic beverages for 24 hours.  ACTIVITY:  You should plan to take it easy for the  rest of today and you should NOT DRIVE or use heavy machinery until tomorrow (because of the sedation medicines used during the test).    FOLLOW UP: Our staff will call the number listed on your records the next business day following your procedure to check on you and address any questions or concerns that you may have regarding the information given to you following your procedure. If we do not reach you, we will leave a message.  However, if you are feeling well and you are not experiencing any problems, there is no need to return our call.  We will assume that you have returned to your regular daily activities without incident.  If any biopsies were taken you will be contacted by phone or by letter within the next 1-3 weeks.  Please call us at [phone] if you have not heard about the biopsies in 3 weeks.    SIGNATURES/CONFIDENTIALITY: You and/or your care partner have signed paperwork which will be entered into your electronic medical record.  These signatures attest to the fact that that the information above on your After Visit Summary has been reviewed and is understood.  Full responsibility of the confidentiality of this discharge information lies with you and/or your care-partner.  Thank you for letting us take care of your heatlhcare needs today.

## 2018-08-16 NOTE — Op Note (Signed)
[facility] Patient Name: Hunter Vega Procedure Date: 08/16/2018 7:48 AM MRN: [id number] Endoscopist: Milus Banister , MD Age: 68 Referring MD:  Date of Birth: April 12, 1951 Gender: Male Account #: [id number] Procedure:                Colonoscopy Indications:              Change in bowel habits, Constipation Medicines:                Monitored Anesthesia Care Procedure:                Pre-Anesthesia Assessment:                           - Prior to the procedure, a History and Physical                            was performed, and patient medications and                            allergies were reviewed. The patient's tolerance of                            previous anesthesia was also reviewed. The risks                            and benefits of the procedure and the sedation                            options and risks were discussed with the patient.                            All questions were answered, and informed consent                            was obtained. Prior Anticoagulants: The patient has                            taken no previous anticoagulant or antiplatelet                            agents. ASA Grade Assessment: II - A patient with                            mild systemic disease. After reviewing the risks                            and benefits, the patient was deemed in                            satisfactory condition to undergo the procedure.                           After obtaining informed consent, the colonoscope  was passed under direct vision. Throughout the                            procedure, the patient's blood pressure, pulse, and                            oxygen saturations were monitored continuously. The                            Colonoscope was introduced through the anus and                            advanced to the the cecum, identified by                            appendiceal orifice and ileocecal  valve. The                            colonoscopy was performed without difficulty. The                            patient tolerated the procedure well. The quality                            of the bowel preparation was good. Scope In: 7:58:46 AM Scope Out: 8:28:52 AM Scope Withdrawal Time: 0 hours 19 minutes 43 seconds  Total Procedure Duration: 0 hours 30 minutes 6 seconds  Findings:                 A 2 mm polyp was found in the ascending colon. The                            polyp was sessile. The polyp was removed with a                            cold snare. Resection and retrieval were complete.                           The exam was otherwise without abnormality on                            direct and retroflexion views. Complications:            No immediate complications. Estimated blood loss:                            None. Estimated Blood Loss:     Estimated blood loss: none. Impression:               - One 2 mm polyp in the ascending colon, removed                            with a cold snare. Resected and retrieved.                           -  The examination was otherwise normal on direct                            and retroflexion views. Recommendation:           - Patient has a contact number available for                            emergencies. The signs and symptoms of potential                            delayed complications were discussed with the                            patient. Return to normal activities tomorrow.                            Written discharge instructions were provided to the                            patient.                           - Resume previous diet.                           - Continue present medications. Continue once daily                            linzess. Also please take one dose of OTC powder                            fiber supplement daily (citrucel) and stay HYDRATED.                           You will receive a letter  within 2-3 weeks with the                            pathology results and my final recommendations.                           If the polyp(s) is proven to be 'pre-cancerous' on                            pathology, you will need repeat colonoscopy in 7                            years. If the polyp(s) is NOT 'precancerous' on                            pathology then you should repeat colon cancer                            screening in 10 years with colonoscopy without need  for colon cancer screening by any method prior to                            then (including stool testing). Milus Banister, MD 08/16/2018 8:34:44 AM This report has been signed electronically.

## 2018-08-19 ENCOUNTER — Encounter: Payer: Self-pay | Admitting: Medical

## 2018-08-19 ENCOUNTER — Telehealth: Payer: Self-pay | Admitting: *Deleted

## 2018-08-19 NOTE — Telephone Encounter (Signed)
  Follow up Call-  Call back number 08/16/2018  Post procedure Call Back phone  # [phone]  Permission to leave phone message Yes  Some recent data might be hidden     Patient questions:  Do you have a fever, pain , or abdominal swelling? No. Pain Score  0 *  Have you tolerated food without any problems? Yes.    Have you been able to return to your normal activities? Yes.    Do you have any questions about your discharge instructions: Diet   No. Medications  No. Follow up visit  No.  Do you have questions or concerns about your Care? No.  Actions: * If pain score is 4 or above: No action needed, pain <4.

## 2018-08-21 ENCOUNTER — Encounter: Payer: Self-pay | Admitting: Gastroenterology

## 2018-08-21 ENCOUNTER — Telehealth: Payer: Self-pay | Admitting: Gastroenterology

## 2018-08-21 NOTE — Telephone Encounter (Signed)
Pt called to report that he has not had a BM in 5 days since his colonoscopy 3.20.20.

## 2018-08-21 NOTE — Telephone Encounter (Signed)
Patient reports that he has not had a BM since Saturn[place].  He started back his linzess on Saturday.  He is asked to add Miralax today and call back Friday am if still no BM by then,

## 2018-09-01 ENCOUNTER — Encounter: Payer: Self-pay | Admitting: Medical

## 2018-09-02 ENCOUNTER — Ambulatory Visit (INDEPENDENT_AMBULATORY_CARE_PROVIDER_SITE_OTHER): Payer: PPO | Admitting: Internal Medicine

## 2018-09-02 ENCOUNTER — Other Ambulatory Visit: Payer: Self-pay

## 2018-09-02 ENCOUNTER — Encounter: Payer: Self-pay | Admitting: Internal Medicine

## 2018-09-02 DIAGNOSIS — R05 Cough: Secondary | ICD-10-CM | POA: Diagnosis not present

## 2018-09-02 DIAGNOSIS — T7840XD Allergy, unspecified, subsequent encounter: Secondary | ICD-10-CM

## 2018-09-02 DIAGNOSIS — R059 Cough, unspecified: Secondary | ICD-10-CM

## 2018-09-02 NOTE — Progress Notes (Signed)
Subjective:    Patient ID: Hunter Vega, male    DOB: [birth year], 68 y.o.   MRN: [id number]  DOS:  09/02/2018 Type of visit - description: Virtual Visit via Video Note  I connected with@ on 09/04/18 at 11:20 AM EDT by a video enabled telemedicine application and verified that I am speaking with the correct person using two identifiers.   THIS ENCOUNTER IS A VIRTUAL VISIT DUE TO COVID-19 - PATIENT WAS NOT SEEN IN THE OFFICE. PATIENT HAS CONSENTED TO VIRTUAL VISIT / TELEMEDICINE VISIT   Location of patient: home  Location of provider: office  I discussed the limitations of evaluation and management by telemedicine and the availability of in person appointments. The patient expressed understanding and agreed to proceed.  History of Present Illness: Acute visit, I interview today the patient and his wife. Symptoms started about a month ago: Nasal discharge, postnasal dripping, he felt it was allergies Symptoms gradually decreased but now for the last week feels his chest is congested, it is worse early in the morning and late in the afternoon.  His congestion is described as the need to clear his chest and throat.  No sputum production. Wonders if it is allergies, has taken Xyzal without much help.   Review of Systems  Denies fever chills No chest pain, difficulty breathing.  He took a walk outside and he noticed no difficulty breathing. No COVID-19 contacts Follows-up good hygiene precautions.  Past Medical History:  Diagnosis Date  . Allergy    mild seasonal  . Constipation    started Linzess monday 08-12-2018   . History of seizure    reports no seizure since 2015   . Hx of exercise stress test    reports  had done to eval since coronary artery stent was placed ; reports test was "normal"   . Hyperlipidemia   . PSVT (paroxysmal supraventricular tachycardia) ([place])    reports no knowledge of this   . Seizures ([place])    last 2015  . Sleep apnea    reports no longer has  sicne losing weight     Past Surgical History:  Procedure Laterality Date  . CARDIAC CATHETERIZATION    . COLONOSCOPY    . CORONARY ANGIOPLASTY    . CORONARY STENT PLACEMENT  2015   x2. Mid LAD  . INGUINAL HERNIA REPAIR Bilateral 09/26/2017   Procedure: LAPAROSCOPIC BILATERAL INGUINAL HERNIA REPAIR;  Surgeon: Michael Boston, MD;  Location: [facility];  Service: General;  Laterality: Bilateral;  . INSERTION OF MESH Bilateral 09/26/2017   Procedure: INSERTION OF MESH;  Surgeon: Michael Boston, MD;  Location: [facility];  Service: General;  Laterality: Bilateral;    Social History   Socioeconomic History  . Marital status: Married    Spouse name: Not on file  . Number of children: 3  . Years of education: Not on file  . Highest education level: Not on file  Occupational History  . Not on file  Social Needs  . Financial resource strain: Not on file  . Food insecurity:    Worry: Not on file    Inability: Not on file  . Transportation needs:    Medical: Not on file    Non-medical: Not on file  Tobacco Use  . Smoking status: Never Smoker  . Smokeless tobacco: Never Used  Substance and Sexual Activity  . Alcohol use: No  . Drug use: No  . Sexual activity: Yes  Lifestyle  . Physical activity:  Days per week: Not on file    Minutes per session: Not on file  . Stress: Not on file  Relationships  . Social connections:    Talks on phone: Not on file    Gets together: Not on file    Attends religious service: Not on file    Active member of club or organization: Not on file    Attends meetings of clubs or organizations: Not on file    Relationship status: Not on file  . Intimate partner violence:    Fear of current or ex partner: Not on file    Emotionally abused: Not on file    Physically abused: Not on file    Forced sexual activity: Not on file  Other Topics Concern  . Not on file  Social History Narrative   Lives with wife   Caffeine use: 1 cup corfee daily   Left handed        Allergies as of 09/02/2018      Reactions   Penicillins Swelling   Arm swelling Has patient had a PCN reaction causing immediate rash, facial/tongue/throat swelling, SOB or lightheadedness with hypotension: No Has patient had a PCN reaction causing severe rash involving mucus membranes or skin necrosis: No Has patient had a PCN reaction that required hospitalization: No Has patient had a PCN reaction occurring within the last 10 years: No If all of the above answers are "NO", then may proceed with Cephalosporin use.      Medication List       Accurate as of September 02, 2018 11:59 PM. Always use your most recent med list.        aspirin EC 81 MG tablet Take 81 mg by mouth daily.   atorvastatin 80 MG tablet Commonly known as:  LIPITOR Take 1 tablet (80 mg total) by mouth at bedtime.   fluticasone 50 MCG/ACT nasal spray Commonly known as:  FLONASE Place 2 sprays into both nostrils daily.   lamoTRIgine 150 MG tablet Commonly known as:  LAMICTAL Take 1 tablet (150 mg total) by mouth 2 (two) times daily.   Linzess 145 MCG Caps capsule Generic drug:  linaclotide           Objective:   Physical Exam There were no vitals taken for this visit. This is a virtual visit with video, he is alert oriented x3, in no apparent distress    Assessment    Patient is 68, history of CAD, PSVT, history of pneumonia 04/2018 with follow-up chest x-ray showing clearing, presents with:  Allergies: Symptoms are likely due to allergies, no evidence of acute bronchitis or COVID-19. We agreed on trying the following: Mucus ER a OTC he already has, it is a guaifenesin product. Fluticasone nasal spray (Flonase) Stop Xyzal, stop decongestants Try fexofenadine and also Flovent twice a day. If not better he will let me know. A summary of my recommendations was sent via message.      I discussed the assessment and treatment plan with the patient. The patient was provided an opportunity to ask  questions and all were answered. The patient agreed with the plan and demonstrated an understanding of the instructions.   The patient was advised to call back or seek an in-person evaluation if the symptoms worsen or if the condition fails to improve as anticipated.

## 2018-09-02 NOTE — Telephone Encounter (Signed)
Virtual visit scheduled.  

## 2018-09-03 ENCOUNTER — Ambulatory Visit: Payer: PPO | Admitting: Nurse Practitioner

## 2018-09-09 ENCOUNTER — Telehealth: Payer: Self-pay | Admitting: Neurology

## 2018-09-09 NOTE — Telephone Encounter (Signed)
09-09-2018 Pt has called to give verbal consent to file insurance for a Virtual Visit on 04-21 @8 :00 Pt gave email of : [email or web address] pt asked to download [employer] webex app/program [email or web address]://[email or web address]

## 2018-09-09 NOTE — Telephone Encounter (Signed)
Noted. Norm Parcel has been sent and pre charting will be completed closer to visit.

## 2018-09-16 NOTE — Telephone Encounter (Signed)
Pt called in and stated he never received email .. verified email   [email or web address]

## 2018-09-16 NOTE — Telephone Encounter (Signed)
E-mail link resubmitted.

## 2018-09-16 NOTE — Telephone Encounter (Addendum)
I contacted the pt and I was able to complete the pre charting for 09/17/18 VV.   Pt understands that although there may be some limitations with this type of visit, we will take all precautions to reduce any security or privacy concerns.  Pt understands that this will be treated like an in office visit and we will file with pt's insurance, and there may be a patient responsible charge related to this service.  Pt's email is [email or web address]. Pt understands that the [employer] webex software must be downloaded and operational on the device pt plans to use for the visit.  Pt states the e-mail previously stated is his wife and would like it to be updated to his gmail account.

## 2018-09-16 NOTE — Addendum Note (Signed)
Addended by: Verlin Grills T on: 09/16/2018 10:58 AM   Modules accepted: Orders

## 2018-09-17 ENCOUNTER — Other Ambulatory Visit: Payer: Self-pay

## 2018-09-17 ENCOUNTER — Ambulatory Visit (INDEPENDENT_AMBULATORY_CARE_PROVIDER_SITE_OTHER): Payer: PPO | Admitting: Neurology

## 2018-09-17 ENCOUNTER — Encounter: Payer: Self-pay | Admitting: Neurology

## 2018-09-17 DIAGNOSIS — G40909 Epilepsy, unspecified, not intractable, without status epilepticus: Secondary | ICD-10-CM

## 2018-09-17 MED ORDER — LAMOTRIGINE 150 MG PO TABS: 150.0000 mg | ORAL_TABLET | Freq: Two times a day (BID) | ORAL | 3 refills | Status: DC

## 2018-09-17 NOTE — Progress Notes (Signed)
     Virtual Visit via Telephone Note  I connected with Hunter Vega on 09/17/18 at  8:00 AM EDT by telephone and verified that I am speaking with the correct person using two identifiers.   I discussed the limitations, risks, security and privacy concerns of performing an evaluation and management service by telephone and the availability of in person appointments. I also discussed with the patient that there may be a patient responsible charge related to this service. The patient expressed understanding and agreed to proceed.  The patient is at home, physician is in the office.   History of Present Illness:  Hunter Vega is a 68 year old left-handed white male with a history of partial complex seizures.  He has done quite well on Lamictal, he takes 150 mg twice daily, he has not had a seizure since September 2015.  His first seizure was in 2012.  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  He recently has had some problems with constipation, and he currently is on Linzess for this.  He was recently treated for a pneumonia.  Otherwise, medically speaking he has been quite stable.  Blood work was done in February 2020.  The patient had a Lamictal level last year of 11.0.   Observations/Objective:  On the telephone, the patient appears to be alert and cooperative, speech is normal without aphasia or dysarthria.  Assessment and Plan: 1.  Partial complex seizures, well controlled  The patient will continue the Lamictal, a prescription will be sent in.  Follow Up Instructions: 1 year follow-up, may see nurse practitioner.   I discussed the assessment and treatment plan with the patient. The patient was provided an opportunity to ask questions and all were answered. The patient agreed with the plan and demonstrated an understanding of the instructions.   The patient was advised to call back or seek an in-person evaluation if the symptoms worsen or  if the condition fails to improve as anticipated.  I provided 15 minutes of non-face-to-face time during this encounter.   Kathrynn Ducking, MD

## 2018-09-18 ENCOUNTER — Other Ambulatory Visit: Payer: Self-pay

## 2018-09-18 ENCOUNTER — Ambulatory Visit (INDEPENDENT_AMBULATORY_CARE_PROVIDER_SITE_OTHER): Payer: PPO | Admitting: Gastroenterology

## 2018-09-18 ENCOUNTER — Encounter: Payer: Self-pay | Admitting: Gastroenterology

## 2018-09-18 VITALS — BP 128/65 | HR 66 | Ht 67.0 in | Wt 155.0 lb

## 2018-09-18 DIAGNOSIS — K5900 Constipation, unspecified: Secondary | ICD-10-CM

## 2018-09-18 MED ORDER — LINACLOTIDE 290 MCG PO CAPS: 290.0000 ug | ORAL_CAPSULE | Freq: Every day | ORAL | 6 refills | Status: DC

## 2018-09-18 NOTE — Progress Notes (Signed)
Review of pertinent gastrointestinal problems: 1.  History of adenomatous colon polyps, low risk.  Colonoscopy Dr. [name] 2011 found external hemorrhoids and mild sigmoid diverticulosis.  Colonoscopy March 2020 Dr. [name] for change in bowel habits found a 2 mm polyp in his ascending colon, the examination was otherwise normal.  The polyp was a small tubular adenoma and I recommended a repeat colonoscopy at 7-year interval 2.  Change in bowel habits, abdominal pains 2019/2020 work-up includes CT scan 06/2018 for abdominal pain showed normal appendix, normal colon with some increased amount of stool, labs 2020 TSH was normal, CBC and complete metabolic profile were also normal.  Colonoscopy above exclude significant pathology.   This service was provided via virtual visit.  Only audio was used.  The patient was located at home.  I was located in my office.  The patient did consent to this virtual visit and is aware of possible charges through their insurance for this visit.  The patient is an established patient.  My certified medical assistant, Grace Bushy, contributed to this visit by contacting the patient by phone 1 or 2 business days prior to the appointment and also followed up on the recommendations I made after the visit.  Time spent on virtual visit: 19  min   HPI: This is a very pleasant 68 year old man whom I last saw the time of a colonoscopy about a month ago.  Overall he's doing fine. Still very bothered by constipation.  The pain in his side is gone fortunately.  He  He's tried fiber (benefiber and citrucel).  hes been using dulcolax. He is currently taking one dose of miralax every morning.  He takes prunes once or twice a day  Drinking plenty of water.    Chief complaint is constipation  ROS: complete GI ROS as described in HPI, all other review negative.  Constitutional:  No unintentional weight loss   Past Medical History:  Diagnosis Date  . Allergy    mild seasonal   . Constipation    started Linzess monday 08-12-2018   . History of seizure    reports no seizure since 2015   . Hx of exercise stress test    reports  had done to eval since coronary artery stent was placed ; reports test was "normal"   . Hyperlipidemia   . PSVT (paroxysmal supraventricular tachycardia) ([place])    reports no knowledge of this   . Seizures ([place])    last 2015  . Sleep apnea    reports no longer has sicne losing weight     Past Surgical History:  Procedure Laterality Date  . CARDIAC CATHETERIZATION    . COLONOSCOPY    . CORONARY ANGIOPLASTY    . CORONARY STENT PLACEMENT  2015   x2. Mid LAD  . INGUINAL HERNIA REPAIR Bilateral 09/26/2017   Procedure: LAPAROSCOPIC BILATERAL INGUINAL HERNIA REPAIR;  Surgeon: Michael Boston, MD;  Location: [facility];  Service: General;  Laterality: Bilateral;  . INSERTION OF MESH Bilateral 09/26/2017   Procedure: INSERTION OF MESH;  Surgeon: Michael Boston, MD;  Location: [facility];  Service: General;  Laterality: Bilateral;    Current Outpatient Medications  Medication Sig Dispense Refill  . aspirin EC 81 MG tablet Take 81 mg by mouth daily.     Marland Kitchen atorvastatin (LIPITOR) 80 MG tablet Take 1 tablet (80 mg total) by mouth at bedtime. 90 tablet 1  . lamoTRIgine (LAMICTAL) 150 MG tablet Take 1 tablet (150 mg total) by mouth 2 (two)  times daily. 180 tablet 3   No current facility-administered medications for this visit.     Allergies as of 09/18/2018 - Review Complete 09/18/2018  Allergen Reaction Noted  . Penicillins Swelling 06/04/2009    Family History  Problem Relation Age of Onset  . Liver disease Mother   . Heart attack Father   . Diabetes Paternal Uncle   . Colon cancer Neg Hx   . Esophageal cancer Neg Hx   . Prostate cancer Neg Hx   . Colon polyps Neg Hx   . Rectal cancer Neg Hx   . Stomach cancer Neg Hx     Social History   Socioeconomic History  . Marital status: Married    Spouse name: Not on file  . Number of children: 3   . Years of education: Not on file  . Highest education level: Not on file  Occupational History  . Not on file  Social Needs  . Financial resource strain: Not on file  . Food insecurity:    Worry: Not on file    Inability: Not on file  . Transportation needs:    Medical: Not on file    Non-medical: Not on file  Tobacco Use  . Smoking status: Never Smoker  . Smokeless tobacco: Never Used  Substance and Sexual Activity  . Alcohol use: No  . Drug use: No  . Sexual activity: Yes  Lifestyle  . Physical activity:    Days per week: Not on file    Minutes per session: Not on file  . Stress: Not on file  Relationships  . Social connections:    Talks on phone: Not on file    Gets together: Not on file    Attends religious service: Not on file    Active member of club or organization: Not on file    Attends meetings of clubs or organizations: Not on file    Relationship status: Not on file  . Intimate partner violence:    Fear of current or ex partner: Not on file    Emotionally abused: Not on file    Physically abused: Not on file    Forced sexual activity: Not on file  Other Topics Concern  . Not on file  Social History Narrative   Lives with wife   Caffeine use: 1 cup corfee daily   Left handed      Physical Exam: Unable to perform because this was a "telemed visit" due to current Covid-19 pandemic  Assessment and plan: 68 y.o. male with significant constipation  His bowel changes, significant constipation really started around the time of a pneumonia and antibiotics for the pneumonia.  He is still struggling with it.  Linzess at 145 mcg dose once daily has not helped in addition to the dietary, supplemental fiber and MiraLAX that he takes.  I am going to call him in a higher dose of Linzess, 290 mcg pill which she will take once daily for now.  He is also going to increase his daily MiraLAX to 2 doses every morning.  He denies stop taking the extra fiber supplements and  rely only on his actually rather high amount of dietary fiber.  He will continue pushing hydration.  He will call to update on his response to these remedies in 2 weeks.  Milk of magnesia seems to always help when he takes it, he understands he is fortunate to have this is a "rescue" as needed.  Please see the "Patient Instructions"  section for addition details about the plan.  Owens Loffler, MD [facility] 09/18/2018, 12:27 PM

## 2018-09-18 NOTE — Patient Instructions (Addendum)
He knows to increase miralax to 2 doses every day.  He knows to stop the extra fiber supplements except for what you get in your regular diet.  We will call in Linzess 290 mcg pills 1 pill once daily, dispense 30 with 6 refills  He will contact me in 2 weeks to update on his symptoms

## 2018-09-20 ENCOUNTER — Encounter: Payer: PPO | Admitting: Gastroenterology

## 2018-09-23 ENCOUNTER — Ambulatory Visit (INDEPENDENT_AMBULATORY_CARE_PROVIDER_SITE_OTHER): Payer: PPO | Admitting: Medical

## 2018-09-23 ENCOUNTER — Encounter: Payer: Self-pay | Admitting: Medical

## 2018-09-23 ENCOUNTER — Other Ambulatory Visit: Payer: Self-pay

## 2018-09-23 DIAGNOSIS — J3089 Other allergic rhinitis: Secondary | ICD-10-CM

## 2018-09-23 DIAGNOSIS — J45909 Unspecified asthma, uncomplicated: Secondary | ICD-10-CM

## 2018-09-23 DIAGNOSIS — R062 Wheezing: Secondary | ICD-10-CM

## 2018-09-23 MED ORDER — MONTELUKAST SODIUM 10 MG PO TABS: 10.0000 mg | ORAL_TABLET | Freq: Every day | ORAL | 3 refills | Status: DC

## 2018-09-23 MED ORDER — PREDNISONE 10 MG PO TABS: ORAL_TABLET | ORAL | 0 refills | Status: DC

## 2018-09-23 NOTE — Progress Notes (Signed)
   Subjective:    Patient ID: Hunter Vega, male    DOB: [birth year], 68 y.o.   MRN: [id number]  HPI  Virtual Visit via Video Note  I connected with Hunter Vega on 09/23/18 at  3:00 PM EDT by a video enabled telemedicine application and verified that I am speaking with the correct person using two identifiers.   I discussed the limitations of evaluation and management by telemedicine and the availability of in person appointments. The patient expressed understanding and agreed to proceed.  Pt has not bp cuff at his house.  History of Present Illness:  Pt in states he states last 2 months feels like he has to clear his throat continuously.   Pt stats recent jogging and he states feel mild constricted in chest. . Pt got some advise from Dr. Larose Kells. He go mucinex ER. In morning he has mild wheeze on and off. He got advise from Dr. Larose Kells on 09-02-2018.  Pt was advised to try allegra, flovent, flonase and as stated the above mucinex. No fever. [place]. Temp 97.7 He picks up his grocery/uses their service.  Pt does have history of pneumonia in the past.  For one one month after xray cleared he stated felt fine.  Pt has no history of smoking. Very brief possible second hand smoke through dad but live with aunt.     Observations/Objective: No acute distress.no labored breathing.  Assessment and Plan: Pt has some probable allergy history with possible reactive airways. Some occasional possible cough variant  as he explains feels like he needs to clear his throat at times.  No fever reported. Pt has been sheltering in place.  Decided to give taper dose prednisone, advise continue antihistamine use but add montelukast to current regimen.  If wheezing or shorntness of breath use albuterol every 4-6 hours.  Check temp for fever daily. If any fever 100 or above let me know.  If not improved then consider getting chest xray. Also considering referring to pulmonologist  for evaluation/[possible pft's/  Follow up by my chart this Friday. Depending on pt update report my need to schedule virtual visit for early next week.  Follow Up Instructions:    I discussed the assessment and treatment plan with the patient. The patient was provided an opportunity to ask questions and all were answered. The patient agreed with the plan and demonstrated an understanding of the instructions.   The patient was advised to call back or seek an in-person evaluation if the symptoms worsen or if the condition fails to improve as anticipated.     [name], PA-C    Review of Systems  Constitutional: Negative for chills, fatigue and fever.  HENT: Positive for postnasal drip. Negative for congestion, sinus pressure, sinus pain and sore throat.   Respiratory: Positive for cough, chest tightness and wheezing. Negative for shortness of breath.   Cardiovascular: Negative for chest pain and palpitations.  Gastrointestinal: Negative for abdominal pain.  Musculoskeletal: Negative for back pain and neck pain.       No leg swelling reported. No calf pain.  Skin: Negative for rash.  Psychiatric/Behavioral: Negative for behavioral problems, dysphoric mood and suicidal ideas. The patient is not nervous/anxious.        Objective:   Physical Exam See objective.       Assessment & Plan:

## 2018-09-23 NOTE — Patient Instructions (Addendum)
Pt has some probable allergy history with possible reactive airways. Some occasional possible cough variant  as he explains feels like he needs to clear his throat at times.  No fever reported. Pt has been sheltering in place.  Decided to give taper dose prednisone, advise continue antihistamine use but add montelukast to current regimen.  If wheezing or shorntness of breath use albuterol every 4-6 hours.  Check temp for fever daily. If any fever 100 or above let me know.  If not improved then consider getting chest xray. Also considering referring to pulmonologist for evaluation/[possible pft's/  Follow up by my chart this Friday. Depending on pt update report my need to schedule virtual visit for early next week.

## 2018-09-24 ENCOUNTER — Ambulatory Visit: Payer: PPO | Admitting: Medical

## 2018-09-24 ENCOUNTER — Telehealth: Payer: Self-pay | Admitting: Medical

## 2018-09-24 MED ORDER — PREDNISONE 10 MG (21) PO TBPK: ORAL_TABLET | ORAL | 0 refills | Status: DC

## 2018-09-24 NOTE — Telephone Encounter (Signed)
Will you call [facility] an let them know sent in new rx of prednisone. They can cancel the rx that was sent yesterday. They had question on it so decided to go ahead and rx 6 day pack.

## 2018-09-27 ENCOUNTER — Encounter: Payer: Self-pay | Admitting: Medical

## 2018-10-01 ENCOUNTER — Ambulatory Visit: Payer: PPO | Admitting: Gastroenterology

## 2018-10-17 ENCOUNTER — Encounter: Payer: Self-pay | Admitting: Medical

## 2018-10-17 ENCOUNTER — Telehealth: Payer: Self-pay | Admitting: Medical

## 2018-10-17 DIAGNOSIS — Z8701 Personal history of pneumonia (recurrent): Secondary | ICD-10-CM

## 2018-10-17 DIAGNOSIS — R0989 Other specified symptoms and signs involving the circulatory and respiratory systems: Secondary | ICD-10-CM

## 2018-10-17 NOTE — Telephone Encounter (Signed)
Chest xray order placed. 

## 2018-10-18 ENCOUNTER — Other Ambulatory Visit: Payer: Self-pay

## 2018-10-18 ENCOUNTER — Ambulatory Visit (HOSPITAL_BASED_OUTPATIENT_CLINIC_OR_DEPARTMENT_OTHER)
Admission: RE | Admit: 2018-10-18 | Discharge: 2018-10-18 | Disposition: A | Discharge status: Home / Self Care | Payer: PPO | Source: Ambulatory Visit | Attending: Medical | Admitting: Medical

## 2018-10-18 DIAGNOSIS — R0989 Other specified symptoms and signs involving the circulatory and respiratory systems: Secondary | ICD-10-CM | POA: Diagnosis not present

## 2018-10-18 DIAGNOSIS — Z8701 Personal history of pneumonia (recurrent): Secondary | ICD-10-CM

## 2018-10-22 ENCOUNTER — Telehealth: Payer: Self-pay | Admitting: Medical

## 2018-10-22 DIAGNOSIS — R0989 Other specified symptoms and signs involving the circulatory and respiratory systems: Secondary | ICD-10-CM

## 2018-10-22 NOTE — Telephone Encounter (Signed)
Referral to pulmonologist placed. 

## 2018-10-23 ENCOUNTER — Telehealth: Payer: Self-pay | Admitting: Medical

## 2018-10-23 MED ORDER — FLUTICASONE-SALMETEROL 100-50 MCG/DOSE IN AEPB: 1.0000 | INHALATION_SPRAY | Freq: Two times a day (BID) | RESPIRATORY_TRACT | 3 refills | Status: DC

## 2018-10-23 NOTE — Telephone Encounter (Signed)
Rx advair sent to pt pharmacy.

## 2018-11-14 ENCOUNTER — Other Ambulatory Visit: Payer: Self-pay

## 2018-11-14 ENCOUNTER — Encounter: Payer: Self-pay | Admitting: Medical

## 2018-11-14 ENCOUNTER — Ambulatory Visit: Payer: PPO | Admitting: Pulmonary Disease

## 2018-11-14 ENCOUNTER — Encounter: Payer: Self-pay | Admitting: Pulmonary Disease

## 2018-11-14 VITALS — BP 140/68 | HR 76 | Temp 97.8°F | Ht 68.0 in | Wt 166.8 lb

## 2018-11-14 DIAGNOSIS — R05 Cough: Secondary | ICD-10-CM | POA: Diagnosis not present

## 2018-11-14 DIAGNOSIS — R059 Cough, unspecified: Secondary | ICD-10-CM

## 2018-11-14 LAB — CBC WITH DIFFERENTIAL/PLATELET
Basophils Absolute: 0.1 10*3/uL (ref 0.0–0.1)
Basophils Relative: 1.1 % (ref 0.0–3.0)
Eosinophils Absolute: 0.4 10*3/uL (ref 0.0–0.7)
Eosinophils Relative: 7.1 % — ABNORMAL HIGH (ref 0.0–5.0)
HCT: 40.1 % (ref 39.0–52.0)
Hemoglobin: 13.2 g/dL (ref 13.0–17.0)
Lymphocytes Relative: 17.4 % (ref 12.0–46.0)
Lymphs Abs: 1 10*3/uL (ref 0.7–4.0)
MCHC: 32.9 g/dL (ref 30.0–36.0)
MCV: 96.8 fl (ref 78.0–100.0)
Monocytes Absolute: 0.7 10*3/uL (ref 0.1–1.0)
Monocytes Relative: 11.5 % (ref 3.0–12.0)
Neutro Abs: 3.7 10*3/uL (ref 1.4–7.7)
Neutrophils Relative %: 62.9 % (ref 43.0–77.0)
Platelets: 164 10*3/uL (ref 150.0–400.0)
RBC: 4.15 Mil/uL — ABNORMAL LOW (ref 4.22–5.81)
RDW: 13.5 % (ref 11.5–15.5)
WBC: 5.9 10*3/uL (ref 4.0–10.5)

## 2018-11-14 MED ORDER — PANTOPRAZOLE SODIUM 40 MG PO TBEC: 40.0000 mg | DELAYED_RELEASE_TABLET | Freq: Two times a day (BID) | ORAL | 5 refills | Status: DC

## 2018-11-14 NOTE — Addendum Note (Signed)
Addended by: Suzzanne Cloud E on: 11/14/2018 10:29 AM   Modules accepted: Orders

## 2018-11-14 NOTE — Progress Notes (Signed)
Hunter Vega    [id number]    14-Oct-1950  Primary Care Physician:Saguier, Iris Pert  Referring Physician: Hervey, Wedig, PA-C [address] [place],  [place] [address]  Chief complaint: Consult for chest congestion  HPI: 68 year old with history of seasonal allergies, constipation, coronary artery disease status post stenting, hyperlipidemia, sleep apnea (not on CPAP)   Complains of chest and throat congestion since October 2020.  Symptoms started when he was mulching grass outdoors.  He was diagnosed with pneumonia in 06/10/2018.  Treated with levofloxacin.  Subsequent chest x-ray shows resolution of infiltrate Evaluated by primary care and April 2020 for dyspnea, wheezing.  Given a prednisone taper and Advair, Singulair for possible allergies, asthma did not help with symptoms  He does have seasonal allergies.  Reports waking up with sore sensation in the mouth.  Suspect he may have silent reflux He was diagnosed with OSA many years ago but stopped using CPAP after he lost weight.  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  No mold, hot tub, Jacuzzi, humidifier Smoking history: Never smoker Travel history: Previously lived in [place], [place], [place].  No recent travel Relevant family history: No significant family strep lung disease  Outpatient Encounter Medications as of 11/14/2018  Medication Sig  . aspirin EC 81 MG tablet Take 81 mg by mouth daily.   Marland Kitchen atorvastatin (LIPITOR) 80 MG tablet Take 1 tablet (80 mg total) by mouth at bedtime.  . lamoTRIgine (LAMICTAL) 150 MG tablet Take 1 tablet (150 mg total) by mouth 2 (two) times daily.  Marland Kitchen linaclotide (LINZESS) 290 MCG CAPS capsule Take 1 capsule (290 mcg total) by mouth daily.  . [DISCONTINUED] predniSONE (STERAPRED UNI-PAK 21 TAB) 10 MG (21) TBPK tablet Taper over 6 days  . Fluticasone-Salmeterol  (ADVAIR) 100-50 MCG/DOSE AEPB Inhale 1 puff into the lungs 2 (two) times daily. (Patient not taking: Reported on 11/14/2018)  . montelukast (SINGULAIR) 10 MG tablet Take 1 tablet (10 mg total) by mouth at bedtime. (Patient not taking: Reported on 11/14/2018)   No facility-administered encounter medications on file as of 11/14/2018.     Allergies as of 11/14/2018 - Review Complete 11/14/2018  Allergen Reaction Noted  . Penicillins Swelling 06/04/2009    Past Medical History:  Diagnosis Date  . Allergy    mild seasonal  . Constipation    started Linzess monday 08-12-2018   . History of seizure    reports no seizure since 2015   . Hx of exercise stress test    reports  had done to eval since coronary artery stent was placed ; reports test was "normal"   . Hyperlipidemia   . PSVT (paroxysmal supraventricular tachycardia) ([place])    reports no knowledge of this   . Seizures ([place])    last 2015  . Sleep apnea    reports no longer has sicne losing weight     Past Surgical History:  Procedure Laterality Date  . CARDIAC CATHETERIZATION    . COLONOSCOPY    . CORONARY ANGIOPLASTY    . CORONARY STENT PLACEMENT  2015   x2. Mid LAD  . INGUINAL HERNIA REPAIR Bilateral 09/26/2017   Procedure: LAPAROSCOPIC BILATERAL INGUINAL HERNIA REPAIR;  Surgeon: Michael Boston, MD;  Location: [facility];  Service: General;  Laterality: Bilateral;  . INSERTION OF MESH Bilateral 09/26/2017   Procedure: INSERTION OF MESH;  Surgeon:  Michael Boston, MD;  Location: [facility];  Service: General;  Laterality: Bilateral;    Family History  Problem Relation Age of Onset  . Liver disease Mother   . Heart attack Father   . Diabetes Paternal Uncle   . Colon cancer Neg Hx   . Esophageal cancer Neg Hx   . Prostate cancer Neg Hx   . Colon polyps Neg Hx   . Rectal cancer Neg Hx   . Stomach cancer Neg Hx     Social History   Socioeconomic History  . Marital status: Married    Spouse name: Not on file  . Number of children:  3  . Years of education: Not on file  . Highest education level: Not on file  Occupational History  . Not on file  Social Needs  . Financial resource strain: Not on file  . Food insecurity    Worry: Not on file    Inability: Not on file  . Transportation needs    Medical: Not on file    Non-medical: Not on file  Tobacco Use  . Smoking status: Never Smoker  . Smokeless tobacco: Never Used  Substance and Sexual Activity  . Alcohol use: No  . Drug use: No  . Sexual activity: Yes  Lifestyle  . Physical activity    Days per week: Not on file    Minutes per session: Not on file  . Stress: Not on file  Relationships  . Social Herbalist on phone: Not on file    Gets together: Not on file    Attends religious service: Not on file    Active member of club or organization: Not on file    Attends meetings of clubs or organizations: Not on file    Relationship status: Not on file  . Intimate partner violence    Fear of current or ex partner: Not on file    Emotionally abused: Not on file    Physically abused: Not on file    Forced sexual activity: Not on file  Other Topics Concern  . Not on file  Social History Narrative   Lives with wife   Caffeine use: 1 cup corfee daily   Left handed     Review of systems: Review of Systems  Constitutional: Negative for fever and chills.  HENT: Negative.   Eyes: Negative for blurred vision.  Respiratory: as per HPI  Cardiovascular: Negative for chest pain and palpitations.  Gastrointestinal: Negative for vomiting, diarrhea, blood per rectum. Genitourinary: Negative for dysuria, urgency, frequency and hematuria.  Musculoskeletal: Negative for myalgias, back pain and joint pain.  Skin: Negative for itching and rash.  Neurological: Negative for dizziness, tremors, focal weakness, seizures and loss of consciousness.  Endo/Heme/Allergies: Negative for environmental allergies.  Psychiatric/Behavioral: Negative for depression,  suicidal ideas and hallucinations.  All other systems reviewed and are negative.  Physical Exam: Blood pressure 140/68, pulse 76, temperature 97.8 F (36.6 C), temperature source Oral, height 5\' 8"  (1.727 m), weight 166 lb 12.8 oz (75.7 kg), SpO2 99 %. Gen:      No acute distress HEENT:  EOMI, sclera anicteric Neck:     No masses; no thyromegaly Lungs:    Clear to auscultation bilaterally; normal respiratory effort CV:         Regular rate and rhythm; no murmurs Abd:      + bowel sounds; soft, non-tender; no palpable masses, no distension Ext:    No edema; adequate peripheral  perfusion Skin:      Warm and dry; no rash Neuro: alert and oriented x 3 Psych: normal mood and affect  Data Reviewed: Imaging: Chest x-ray 10/18/2018- clear lungs.  Lt hilar vascular structures are stable. CT abdomen 07/11/2018-visualized lung bases are clear. I have reviewed the images personally  Labs: CBC 07/11/2018-WBC 6.9, hemoglobin 12.9, platelets 187, eos 4.2%, absolute eosinophil count 290  Assessment:  Throat, chest congestion Suspect upper airway issues - allergic rhinitis, postnasal drip and silent reflux Advised lifestyle changes to deal with GERD We will start him on Protonix twice daily for 2 to 4 weeks  For postnasal drip we will continue Flonase and Singulair We will start him on chlorpheniramine 8 mg 3 times daily  Suspicion for asthma is low.  We will evaluate with CBC differential, IgE and PFTs  Plan/Recommendations: - Continue Flonase, Singulair - Chlorpheniramine - Protonix - CBC differential, IgE, PFTs  Marshell Garfinkel MD [facility] 11/14/2018, 9:47 AM  CC: [name], PA-C

## 2018-11-14 NOTE — Patient Instructions (Addendum)
Continue the Flonase nasal spray and Singulair We will start you on an anti histamine called chlorpheniramine 8 mg 3 times daily.  This is available over-the-counter  We will start you on Protonix 40 mg twice daily Remember to avoid spicy food, elevate the head of the bed during sleep Avoid going to bed for at least 2 to 3 hours after dinner  We will check some labs including CBC with differential and IgE We will schedule you for pulmonary function test Follow-up in 1 month  GERD (REFLUX)  is an extremely common cause of respiratory symptoms just like yours , many times with no obvious heartburn at all.    It can be treated with medication, but also with lifestyle changes including elevation of the head of your bed (ideally with 6 -8inch blocks under the headboard of your bed),  Smoking cessation, avoidance of late meals, excessive alcohol, and avoid fatty, spicy foods, chocolate, peppermint, colas, red wine, and acidic juices such as orange juice.  NO MINT OR MENTHOL PRODUCTS SO NO COUGH DROPS  USE SUGARLESS CANDY INSTEAD (Jolley ranchers or Stover's or Life Savers) or even ice chips will also do - the key is to swallow to prevent all throat clearing. NO OIL BASED VITAMINS - use powdered substitutes.  Avoid fish oil when coughing.

## 2018-11-15 LAB — IGE: IgE (Immunoglobulin E), Serum: 80 kU/L (ref <=114)

## 2018-11-26 ENCOUNTER — Institutional Professional Consult (permissible substitution): Payer: PPO | Admitting: Internal Medicine

## 2018-11-26 DIAGNOSIS — R059 Cough, unspecified: Secondary | ICD-10-CM

## 2018-11-26 DIAGNOSIS — R05 Cough: Secondary | ICD-10-CM

## 2018-11-26 NOTE — Telephone Encounter (Signed)
The labs look ok except for elevation in eosinophils which is an inflammatory cell caused by allergies. Continue current therapy  Yes. we can send in a prescription for a wedge pillow. Can you find out how and where we need to send the prescription?

## 2018-11-26 NOTE — Telephone Encounter (Signed)
Thanks for checking,  The other thing i forgot to ask on the previous message is about getting a wedge pillow. The insurance associate I spoke with said that we may be able to get one of those pillows covered if there was a Rx from the Dr. . Is that a possibility? I have tried some of the cheaper ones but I am having a terrible night trying to get a good night sleep. My son- in- law , who also has GERD, recommended the one he has , He is very happy with It. However it costs over $200.00. Like me, he sleeps almost exclusively on his side .   Patient is also requesting lab results.   Message routed to Dr Vaughan Browner

## 2018-11-26 NOTE — Telephone Encounter (Signed)
Dr Vaughan Browner, pt is requesting results of lab work.  Thank you.  Will forward to [place] to follow up.

## 2018-11-28 NOTE — Telephone Encounter (Signed)
The labs look ok except for elevation in eosinophils which is an inflammatory cell caused by allergies. Continue current therapy  Yes. we can send in a prescription for a wedge pillow. Can you find out how and where we need to send the prescription?

## 2018-12-02 NOTE — Telephone Encounter (Signed)
(  See previous MyChart messages) Pt stated we would need to call [employer] [phone] to send an authorization for his requested wedge pillow rather than a prescription.   I called [employer] and they stated they needed a [place] code for the wedge pillow. I let them know I would get back with the pt regarding this.   I let pt know of this and he responded:  "I will discuss with Dr. [name] and ask his recommendation for a pillow that will help with my GERD and that you will be able to find a [place]. Also the diagnosis was made by a Pulmonary Dr. that I saw for what I thought was chest congestion."  I further explained that the [facility] code sounds like a manufacturing code and recommended he contact [place] for that code. Will keep encounter open to f/u once pt responds.

## 2018-12-04 ENCOUNTER — Telehealth: Payer: Self-pay | Admitting: Pulmonary Disease

## 2018-12-04 NOTE — Telephone Encounter (Signed)
Spoke with pt. He is scheduled to have a PFT on 01/01/2019 with our office. Pt wanted to make [place] aware that he has another doctors appointment on 12/31/2018 at [facility] so that we don't schedule his COVID testing at the same time. Will route message to Burman Nieves to let her know.

## 2018-12-10 NOTE — Telephone Encounter (Signed)
I call and schedule COVID testing the week prior to PFT.

## 2018-12-10 NOTE — Telephone Encounter (Signed)
Hunter Vega, please advise if you have been able to call to get pt scheduled for COVID test prior to PFT which is scheduled 8/5. Thanks!

## 2018-12-12 NOTE — Telephone Encounter (Signed)
Noted  

## 2018-12-17 DIAGNOSIS — H2513 Age-related nuclear cataract, bilateral: Secondary | ICD-10-CM | POA: Diagnosis not present

## 2018-12-17 DIAGNOSIS — H16223 Keratoconjunctivitis sicca, not specified as Sjogren's, bilateral: Secondary | ICD-10-CM | POA: Diagnosis not present

## 2018-12-17 DIAGNOSIS — H40003 Preglaucoma, unspecified, bilateral: Secondary | ICD-10-CM | POA: Diagnosis not present

## 2018-12-23 ENCOUNTER — Other Ambulatory Visit: Payer: Self-pay | Admitting: Pulmonary Disease

## 2018-12-23 ENCOUNTER — Telehealth: Payer: Self-pay | Admitting: Pulmonary Disease

## 2018-12-23 NOTE — Telephone Encounter (Signed)
Checked to see if I could figure out who had tried to call pt and I saw that Burman Nieves had attempted to contact pt to schedule covid test prior to the PFT. Called and spoke with pt letting him know this and stated to him that I would send this to Burman Nieves so she could try to contact pt again to get this scheduled for him and he verbalized understanding.  Routing to [employer].

## 2018-12-23 NOTE — Telephone Encounter (Signed)
Patient has been scheduled. Nothing further needed.  

## 2018-12-26 ENCOUNTER — Other Ambulatory Visit (HOSPITAL_COMMUNITY): Payer: PPO

## 2018-12-28 ENCOUNTER — Other Ambulatory Visit (HOSPITAL_COMMUNITY)
Admission: RE | Admit: 2018-12-28 | Discharge: 2018-12-28 | Disposition: A | Discharge status: Home / Self Care | Payer: PPO | Source: Ambulatory Visit | Attending: Pulmonary Disease | Admitting: Pulmonary Disease

## 2018-12-28 DIAGNOSIS — Z01812 Encounter for preprocedural laboratory examination: Secondary | ICD-10-CM | POA: Diagnosis not present

## 2018-12-28 DIAGNOSIS — Z20828 Contact with and (suspected) exposure to other viral communicable diseases: Secondary | ICD-10-CM | POA: Insufficient documentation

## 2018-12-28 LAB — SARS CORONAVIRUS 2 (TAT 6-24 HRS): SARS Coronavirus 2: NEGATIVE

## 2018-12-31 ENCOUNTER — Ambulatory Visit: Payer: PPO | Admitting: Gastroenterology

## 2019-01-01 ENCOUNTER — Other Ambulatory Visit: Payer: Self-pay

## 2019-01-01 ENCOUNTER — Ambulatory Visit (INDEPENDENT_AMBULATORY_CARE_PROVIDER_SITE_OTHER): Payer: PPO | Admitting: Pulmonary Disease

## 2019-01-01 DIAGNOSIS — R059 Cough, unspecified: Secondary | ICD-10-CM

## 2019-01-01 DIAGNOSIS — R05 Cough: Secondary | ICD-10-CM | POA: Diagnosis not present

## 2019-01-01 LAB — PULMONARY FUNCTION TEST
DL/VA % pred: 105 %
DL/VA: 4.33 ml/min/mmHg/L
DLCO unc % pred: 108 %
DLCO unc: 25.74 ml/min/mmHg
FEF 25-75 Post: 2.57 L/sec
FEF 25-75 Pre: 2.42 L/sec
FEF2575-%Change-Post: 6 %
FEF2575-%Pred-Post: 114 %
FEF2575-%Pred-Pre: 107 %
FEV1-%Change-Post: 0 %
FEV1-%Pred-Post: 112 %
FEV1-%Pred-Pre: 111 %
FEV1-Post: 3.3 L
FEV1-Pre: 3.27 L
FEV1FVC-%Change-Post: 2 %
FEV1FVC-%Pred-Pre: 101 %
FEV6-%Change-Post: -1 %
FEV6-%Pred-Post: 113 %
FEV6-%Pred-Pre: 115 %
FEV6-Post: 4.26 L
FEV6-Pre: 4.31 L
FEV6FVC-%Change-Post: 0 %
FEV6FVC-%Pred-Post: 105 %
FEV6FVC-%Pred-Pre: 105 %
FVC-%Change-Post: -2 %
FVC-%Pred-Post: 107 %
FVC-%Pred-Pre: 109 %
FVC-Post: 4.27 L
FVC-Pre: 4.36 L
Post FEV1/FVC ratio: 77 %
Post FEV6/FVC ratio: 100 %
Pre FEV1/FVC ratio: 75 %
Pre FEV6/FVC Ratio: 99 %
RV % pred: 99 %
RV: 2.25 L
TLC % pred: 100 %
TLC: 6.48 L

## 2019-01-01 NOTE — Progress Notes (Signed)
PFT done today. 

## 2019-01-07 ENCOUNTER — Other Ambulatory Visit: Payer: Self-pay

## 2019-01-07 ENCOUNTER — Encounter: Payer: Self-pay | Admitting: Pulmonary Disease

## 2019-01-07 ENCOUNTER — Ambulatory Visit: Payer: PPO | Admitting: Pulmonary Disease

## 2019-01-07 VITALS — BP 120/76 | HR 63 | Temp 97.8°F | Ht 68.0 in | Wt 156.6 lb

## 2019-01-07 DIAGNOSIS — R05 Cough: Secondary | ICD-10-CM | POA: Diagnosis not present

## 2019-01-07 DIAGNOSIS — R059 Cough, unspecified: Secondary | ICD-10-CM

## 2019-01-07 MED ORDER — PANTOPRAZOLE SODIUM 40 MG PO TBEC: 40.0000 mg | DELAYED_RELEASE_TABLET | Freq: Every day | ORAL | 3 refills | Status: DC

## 2019-01-07 NOTE — Patient Instructions (Signed)
Glad you are doing better with regard to the cough Continue using the Protonix.  We will change the prescription to once a day at night and send in a 90-day prescription with 3 refills I have reviewed your lung function test which is normal Follow-up in 3 months.

## 2019-01-07 NOTE — Progress Notes (Signed)
Hunter Vega    [id number]    08/20/1950  Primary Care Physician:Saguier, Iris Pert  Referring Physician: Deniro, Laymon, PA-C [address] [place],  [place] [address]  Chief complaint: Follow-up for cough  HPI: 68 year old with history of seasonal allergies, constipation, coronary artery disease status post stenting, hyperlipidemia, sleep apnea (not on CPAP)   Complains of chest and throat congestion since October 2020.  Symptoms started when he was mulching grass outdoors.  He was diagnosed with pneumonia in 06/10/2018.  Treated with levofloxacin.  Subsequent chest x-ray shows resolution of infiltrate Evaluated by primary care and April 2020 for dyspnea, wheezing.  Given a prednisone taper and Advair, Singulair for possible allergies, asthma did not help with symptoms  He does have seasonal allergies.  Reports waking up with sore sensation in the mouth.  Suspect he may have silent reflux He was diagnosed with OSA many years ago but stopped using CPAP after he lost weight.  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  No mold, hot tub, Jacuzzi, humidifier Smoking history: Never smoker Travel history: Previously lived in [place], [place], [place].  No recent travel Relevant family history: No significant family strep lung disease  Interim history: Started on treatment for postnasal drip and GERD at last visit with Flonase, chlorphentermine and Protonix twice daily. States that his symptoms are much better with improvement in cough, throat and chest congestion.  Feels that it is the good treatment that has really helped with the symptoms and not antiallergy medication  Outpatient Encounter Medications as of 01/07/2019  Medication Sig  . aspirin EC 81 MG tablet Take 81 mg by mouth daily.   Marland Kitchen atorvastatin (LIPITOR) 80 MG tablet Take 1 tablet (80 mg  total) by mouth at bedtime.  . lamoTRIgine (LAMICTAL) 150 MG tablet Take 1 tablet (150 mg total) by mouth 2 (two) times daily.  Marland Kitchen linaclotide (LINZESS) 290 MCG CAPS capsule Take 1 capsule (290 mcg total) by mouth daily.  . pantoprazole (PROTONIX) 40 MG tablet Take 1 tablet (40 mg total) by mouth 2 (two) times daily.  . [DISCONTINUED] Fluticasone-Salmeterol (ADVAIR) 100-50 MCG/DOSE AEPB Inhale 1 puff into the lungs 2 (two) times daily. (Patient not taking: Reported on 11/14/2018)  . [DISCONTINUED] montelukast (SINGULAIR) 10 MG tablet Take 1 tablet (10 mg total) by mouth at bedtime. (Patient not taking: Reported on 11/14/2018)   No facility-administered encounter medications on file as of 01/07/2019.    Physical Exam: Blood pressure 120/76, pulse 63, temperature 97.8 F (36.6 C), temperature source Oral, height 5\' 8"  (1.727 m), weight 156 lb 9.6 oz (71 kg), SpO2 98 %. Gen:      No acute distress HEENT:  EOMI, sclera anicteric Neck:     No masses; no thyromegaly Lungs:    Clear to auscultation bilaterally; normal respiratory effort CV:         Regular rate and rhythm; no murmurs Abd:      + bowel sounds; soft, non-tender; no palpable masses, no distension Ext:    No edema; adequate peripheral perfusion Skin:      Warm and dry; no rash Neuro: alert and oriented x 3 Psych: normal mood and affect  Data Reviewed: Imaging: Chest x-ray 10/18/2018- clear lungs.  Lt hilar vascular structures are stable. CT abdomen 07/11/2018-visualized lung bases are clear. Chest x-ray 10/18/2018- no active cardiopulmonary disease. I have  reviewed the images personally  PFTs: 01/01/2019 FVC 4.27 [107%], FEV1 3.30 [112%], F/F 77, TLC 6.48 [20%], DLCO 25.7 [108%] Normal test  Labs: CBC 07/11/2018-WBC 6.9, hemoglobin 12.9, platelets 187, eos 4.2%, absolute eosinophil count 290 CBC 11/14/2018- WBC 5.9, eos 7.1%, absolute eosinophil count 419 IgE 11/14/2018-80  Assessment:  Throat, chest congestion, cough Secondary to  acid reflux.  Symptoms improved with PPI therapy and behavioral changes He has been instructed to not lie down at least 2 hours after dinner and is using wedge pillows As symptoms are under good control we will reduce Protonix to once a day at night  Has elevated IgE and probably mild allergies. Do not feel postnasal drip is a major component as he feels antiallergy therapy has not helped as much as the PPI therapy Reviewed PFTs which are normal.  OSA Discontinued CPAP after he lost weight.  No symptoms at present Continue to monitor  Plan/Recommendations: - Reduce Protonix to 40 mg at night  Marshell Garfinkel MD [facility] 01/07/2019, 9:59 AM  CC: [name], PA-C

## 2019-01-25 ENCOUNTER — Other Ambulatory Visit: Payer: Self-pay | Admitting: Cardiology

## 2019-01-27 DIAGNOSIS — H40003 Preglaucoma, unspecified, bilateral: Secondary | ICD-10-CM | POA: Diagnosis not present

## 2019-01-28 ENCOUNTER — Ambulatory Visit: Payer: PPO | Admitting: Gastroenterology

## 2019-01-28 ENCOUNTER — Encounter: Payer: Self-pay | Admitting: Gastroenterology

## 2019-01-28 ENCOUNTER — Other Ambulatory Visit: Payer: Self-pay | Admitting: Cardiology

## 2019-01-28 ENCOUNTER — Ambulatory Visit (INDEPENDENT_AMBULATORY_CARE_PROVIDER_SITE_OTHER): Payer: PPO | Admitting: Gastroenterology

## 2019-01-28 VITALS — BP 122/64 | HR 65 | Temp 97.9°F | Ht 68.0 in | Wt 158.6 lb

## 2019-01-28 DIAGNOSIS — K5900 Constipation, unspecified: Secondary | ICD-10-CM | POA: Diagnosis not present

## 2019-01-28 MED ORDER — LINACLOTIDE 145 MCG PO CAPS: 145.0000 ug | ORAL_CAPSULE | Freq: Every day | ORAL | 3 refills | Status: DC

## 2019-01-28 NOTE — Progress Notes (Signed)
Review of pertinent gastrointestinal problems: 1.  History of adenomatous colon polyps, low risk.  Colonoscopy Dr. [name] 2011 found external hemorrhoids and mild sigmoid diverticulosis.  Colonoscopy March 2020 Dr. [name] for change in bowel habits found a 2 mm polyp in his ascending colon, the examination was otherwise normal.  The polyp was a small tubular adenoma and I recommended a repeat colonoscopy at 7-year interval 2.  Change in bowel habits, abdominal pains 2019/2020 work-up includes CT scan 06/2018 for abdominal pain showed normal appendix, normal colon with some increased amount of stool, labs 2020 TSH was normal, CBC and complete metabolic profile were also normal.    HPI: This is a pleasant 68 year old man.  I last saw him via telemedicine visit in 3 to 4 months ago.  He has been battling a change in his bowels, relative constipation.  I recommended a bowel purge 1 to 2 weeks ago and since then he is now actually having BMs for 5 times a day.  He is continuing to take his Linzess 290 mcg once daily.  He also mentioned that he was started on proton pump inhibitor twice daily for throat clearing that was worse at night.  Proton pump inhibitor Protonix at twice daily dosing definitely helped his throat clearing.  His pulmonologist recommended that he change the Protonix that he is taking it shortly before his dinner meal and only once daily.  He tells me that this is helping just as well as twice daily.  He has no dysphasia.  His weight is been overall stable.  He sees no blood in the stool  Labs June 2020 show normal CBC COVID negative test August 2020  Chief complaint is change in bowels  ROS: complete GI ROS as described in HPI, all other review negative.  Constitutional:  No unintentional weight loss   Past Medical History:  Diagnosis Date  . Allergy    mild seasonal  . Constipation    started Linzess monday 08-12-2018   . History of seizure    reports no seizure since 2015    . Hx of exercise stress test    reports  had done to eval since coronary artery stent was placed ; reports test was "normal"   . Hyperlipidemia   . PSVT (paroxysmal supraventricular tachycardia) ([place])    reports no knowledge of this   . Seizures ([place])    last 2015  . Sleep apnea    reports no longer has sicne losing weight     Past Surgical History:  Procedure Laterality Date  . CARDIAC CATHETERIZATION    . COLONOSCOPY    . CORONARY ANGIOPLASTY    . CORONARY STENT PLACEMENT  2015   x2. Mid LAD  . INGUINAL HERNIA REPAIR Bilateral 09/26/2017   Procedure: LAPAROSCOPIC BILATERAL INGUINAL HERNIA REPAIR;  Surgeon: Michael Boston, MD;  [facility];  Service: General;  Laterality: Bilateral;  . INSERTION OF MESH Bilateral 09/26/2017   Procedure: INSERTION OF MESH;  Surgeon: Michael Boston, MD;  Location: [facility];  Service: General;  Laterality: Bilateral;    Current Outpatient Medications  Medication Sig Dispense Refill  . aspirin EC 81 MG tablet Take 81 mg by mouth daily.     Marland Kitchen atorvastatin (LIPITOR) 80 MG tablet TAKE 1 TABLET BY MOUTH ONCE DAILY AT BEDTIME 90 tablet 0  . lamoTRIgine (LAMICTAL) 150 MG tablet Take 1 tablet (150 mg total) by mouth 2 (two) times daily. 180 tablet 3  . linaclotide (LINZESS) 290 MCG CAPS  capsule Take 1 capsule (290 mcg total) by mouth daily. 30 capsule 6  . pantoprazole (PROTONIX) 40 MG tablet Take 1 tablet (40 mg total) by mouth at bedtime. 90 tablet 3   No current facility-administered medications for this visit.     Allergies as of 01/28/2019 - Review Complete 01/07/2019  Allergen Reaction Noted  . Penicillins Swelling 06/04/2009    Family History  Problem Relation Age of Onset  . Liver disease Mother   . Heart attack Father   . Diabetes Paternal Uncle   . Colon cancer Neg Hx   . Esophageal cancer Neg Hx   . Prostate cancer Neg Hx   . Colon polyps Neg Hx   . Rectal cancer Neg Hx   . Stomach cancer Neg Hx     Social History    Socioeconomic History  . Marital status: Married    Spouse name: Not on file  . Number of children: 3  . Years of education: Not on file  . Highest education level: Not on file  Occupational History  . Not on file  Social Needs  . Financial resource strain: Not on file  . Food insecurity    Worry: Not on file    Inability: Not on file  . Transportation needs    Medical: Not on file    Non-medical: Not on file  Tobacco Use  . Smoking status: Never Smoker  . Smokeless tobacco: Never Used  Substance and Sexual Activity  . Alcohol use: No  . Drug use: No  . Sexual activity: Yes  Lifestyle  . Physical activity    Days per week: Not on file    Minutes per session: Not on file  . Stress: Not on file  Relationships  . Social Herbalist on phone: Not on file    Gets together: Not on file    Attends religious service: Not on file    Active member of club or organization: Not on file    Attends meetings of clubs or organizations: Not on file    Relationship status: Not on file  . Intimate partner violence    Fear of current or ex partner: Not on file    Emotionally abused: Not on file    Physically abused: Not on file    Forced sexual activity: Not on file  Other Topics Concern  . Not on file  Social History Narrative   Lives with wife   Caffeine use: 1 cup corfee daily   Left handed      Physical Exam: BP 122/64 (BP Location: Left Arm, Patient Position: Sitting, Cuff Size: Normal)   Pulse 65   Temp 97.9 F (36.6 C) (Oral)   Ht 5\' 8"  (1.727 m)   Wt 158 lb 9 oz (71.9 kg)   BMI 24.11 kg/m  Constitutional: generally well-appearing Psychiatric: alert and oriented x3 Abdomen: soft, nontender, nondistended, no obvious ascites, no peritoneal signs, normal bowel sounds No peripheral edema noted in lower extremities  Assessment and plan: 68 y.o. male with altered bowel habits  For many years he would have bowel movement only once every 3 or 4 days.  Seem  to get worse recently and after purge and starting Linzess at 290 mcg once daily he is moving his bowels 4-5 times daily.  He is actually not too bothered by this.  I recommended that we try him on medium strength Linzess, 145 mcg pills 1 pill once daily to see if we  can decrease his daily stooling output a bit.  He will call to report on his response in 2 weeks.  Please see the "Patient Instructions" section for addition details about the plan.  Owens Loffler, MD [facility] 01/28/2019, 3:42 PM

## 2019-01-28 NOTE — Patient Instructions (Addendum)
We have sent the following medications to your pharmacy for you to pick up at your convenience: Linzess 145 mcg daily  Please call in 2-3 weeks to report your progress on lower dose Linzess  Thank you for entrusting me with your care and choosing [facility].  Dr [name]

## 2019-01-29 NOTE — Progress Notes (Signed)
Cardiology Office Note:    Date:  01/30/2019   ID:  Hunter Vega, DOB 1951/04/11, MRN [id number]  PCP:  [name], PA-C  Cardiologist:  Shirlee More, MD    Referring MD: [name], PA-C    ASSESSMENT:    1. PSVT (paroxysmal supraventricular tachycardia) ([place])   2. Coronary artery disease of native artery of native heart with stable angina pectoris ([place])   3. Mixed hyperlipidemia    PLAN:    In order of problems listed above:  1. Stable no clinical recurrence 2. Stable New York Heart Association class I continue aspirin lipid-lowering high intensity statin at this time I would not advise an ischemia evaluation in the absence of angina 3. Stable lipids have been ideal recheck a lipid profile and CMP today for liver function continue high intensity statin with CAD   Next appointment: 1 year   Medication Adjustments/Labs and Tests Ordered: Current medicines are reviewed at length with the patient today.  Concerns regarding medicines are outlined above.  No orders of the defined types were placed in this encounter.  No orders of the defined types were placed in this encounter.   Chief Complaint  Patient presents with  . Follow-up  . Coronary Artery Disease    History of Present Illness:    Hunter Vega is a 68 y.o. male with a hx of CAD PCI and stent left anterior descending coronary artery 2014 hyperlipidemia and supraventricular tachycardia  last seen 04/18/2018. Compliance with diet, lifestyle and medications: Yes he follows a healthy lifestyle diet exercise Has had a good year no angina dyspnea palpitation or syncope has good healthcare literacy and practices the 3 drug use to avoid COVID 19 Past Medical History:  Diagnosis Date  . Allergy    mild seasonal  . Constipation    started Linzess monday 08-12-2018   . History of seizure    reports no seizure since 2015   . Hx of exercise stress test    reports  had done to eval since coronary artery  stent was placed ; reports test was "normal"   . Hyperlipidemia   . PSVT (paroxysmal supraventricular tachycardia) ([place])    reports no knowledge of this   . Seizures ([place])    last 2015  . Sleep apnea    reports no longer has sicne losing weight     Past Surgical History:  Procedure Laterality Date  . CARDIAC CATHETERIZATION    . COLONOSCOPY    . CORONARY ANGIOPLASTY    . CORONARY STENT PLACEMENT  2015   x2. Mid LAD  . INGUINAL HERNIA REPAIR Bilateral 09/26/2017   Procedure: LAPAROSCOPIC BILATERAL INGUINAL HERNIA REPAIR;  Surgeon: Michael Boston, MD;  Location: [facility];  Service: General;  Laterality: Bilateral;  . INSERTION OF MESH Bilateral 09/26/2017   Procedure: INSERTION OF MESH;  Surgeon: Michael Boston, MD;  Location: [facility];  Service: General;  Laterality: Bilateral;    Current Medications: Current Meds  Medication Sig  . aspirin EC 81 MG tablet Take 81 mg by mouth daily.   Marland Kitchen atorvastatin (LIPITOR) 80 MG tablet TAKE 1 TABLET BY MOUTH ONCE DAILY AT BEDTIME  . lamoTRIgine (LAMICTAL) 150 MG tablet Take 1 tablet (150 mg total) by mouth 2 (two) times daily.  Marland Kitchen linaclotide (LINZESS) 145 MCG CAPS capsule Take 1 capsule (145 mcg total) by mouth daily.  . pantoprazole (PROTONIX) 40 MG tablet Take 1 tablet (40 mg total) by mouth at bedtime.     Allergies:  Penicillins   Social History   Socioeconomic History  . Marital status: Married    Spouse name: Not on file  . Number of children: 3  . Years of education: Not on file  . Highest education level: Not on file  Occupational History  . Not on file  Social Needs  . Financial resource strain: Not on file  . Food insecurity    Worry: Not on file    Inability: Not on file  . Transportation needs    Medical: Not on file    Non-medical: Not on file  Tobacco Use  . Smoking status: Never Smoker  . Smokeless tobacco: Never Used  Substance and Sexual Activity  . Alcohol use: No  . Drug use: No  . Sexual activity: Yes   Lifestyle  . Physical activity    Days per week: Not on file    Minutes per session: Not on file  . Stress: Not on file  Relationships  . Social Herbalist on phone: Not on file    Gets together: Not on file    Attends religious service: Not on file    Active member of club or organization: Not on file    Attends meetings of clubs or organizations: Not on file    Relationship status: Not on file  Other Topics Concern  . Not on file  Social History Narrative   Lives with wife   Caffeine use: 1 cup corfee daily   Left handed      Family History: The patient's family history includes Diabetes in his paternal uncle; Heart attack in his father; Liver disease in his mother. There is no history of Colon cancer, Esophageal cancer, Prostate cancer, Colon polyps, Rectal cancer, or Stomach cancer. ROS:   Please see the history of present illness.    All other systems reviewed and are negative.  EKGs/Labs/Other Studies Reviewed:    The following studies were reviewed today:  EKG:  EKG ordered today and personally reviewed.  The ekg ordered today demonstrates sinus rhythm and remains normal  Recent Labs: 07/11/2018: ALT 12; BUN 13; Creatinine, Ser 1.13; Potassium 4.9; Sodium 143 07/18/2018: TSH 3.47 11/14/2018: Hemoglobin 13.2; Platelets 164.0  Recent Lipid Panel    Component Value Date/Time   CHOL 147 03/26/2018 1019   TRIG 129.0 03/26/2018 1019   HDL 46.60 03/26/2018 1019   CHOLHDL 3 03/26/2018 1019   VLDL 25.8 03/26/2018 1019   LDLCALC 75 03/26/2018 1019    Physical Exam:    VS:  BP 116/68 (BP Location: Right Arm, Patient Position: Sitting, Cuff Size: Normal)   Pulse 62   Ht 5\' 8"  (1.727 m)   Wt 157 lb 6.4 oz (71.4 kg)   SpO2 98%   BMI 23.93 kg/m     Wt Readings from Last 3 Encounters:  01/30/19 157 lb 6.4 oz (71.4 kg)  01/28/19 158 lb 9 oz (71.9 kg)  01/07/19 156 lb 9.6 oz (71 kg)     GEN:  Well nourished, well developed in no acute distress HEENT:  Normal NECK: No JVD; No carotid bruits LYMPHATICS: No lymphadenopathy CARDIAC: RRR, no murmurs, rubs, gallops RESPIRATORY:  Clear to auscultation without rales, wheezing or rhonchi  ABDOMEN: Soft, non-tender, non-distended MUSCULOSKELETAL:  No edema; No deformity  SKIN: Warm and dry NEUROLOGIC:  Alert and oriented x 3 PSYCHIATRIC:  Normal affect    Signed, Shirlee More, MD  01/30/2019 9:03 AM    [place]

## 2019-01-30 ENCOUNTER — Other Ambulatory Visit: Payer: Self-pay

## 2019-01-30 ENCOUNTER — Encounter: Payer: Self-pay | Admitting: Cardiology

## 2019-01-30 ENCOUNTER — Ambulatory Visit (INDEPENDENT_AMBULATORY_CARE_PROVIDER_SITE_OTHER): Payer: PPO | Admitting: Cardiology

## 2019-01-30 VITALS — BP 116/68 | HR 62 | Ht 68.0 in | Wt 157.4 lb

## 2019-01-30 DIAGNOSIS — I471 Supraventricular tachycardia: Secondary | ICD-10-CM

## 2019-01-30 DIAGNOSIS — I25118 Atherosclerotic heart disease of native coronary artery with other forms of angina pectoris: Secondary | ICD-10-CM

## 2019-01-30 DIAGNOSIS — E782 Mixed hyperlipidemia: Secondary | ICD-10-CM | POA: Diagnosis not present

## 2019-01-30 MED ORDER — ATORVASTATIN CALCIUM 80 MG PO TABS: 80.0000 mg | ORAL_TABLET | Freq: Every day | ORAL | 3 refills | Status: DC

## 2019-01-30 NOTE — Patient Instructions (Signed)
Medication Instructions:  Your physician recommends that you continue on your current medications as directed. Please refer to the Current Medication list given to you today.  If you need a refill on your cardiac medications before your next appointment, please call your pharmacy.   Lab work: Your physician recommends that you return for lab work today: CMP, lipid panel.   If you have labs (blood work) drawn today and your tests are completely normal, you will receive your results only by: Marland Kitchen MyChart Message (if you have MyChart) OR . A paper copy in the mail If you have any lab test that is abnormal or we need to change your treatment, we will call you to review the results.  Testing/Procedures: You had an EKG today.   Follow-Up: At [facility], you and your health needs are our priority.  As part of our continuing mission to provide you with exceptional heart care, we have created designated Provider Care Teams.  These Care Teams include your primary Cardiologist (physician) and Advanced Practice Providers (APPs -  Physician Assistants and Nurse Practitioners) who all work together to provide you with the care you need, when you need it. You will need a follow up appointment in 1 years.  Please call our office 2 months in advance to schedule this appointment.

## 2019-01-31 LAB — COMPREHENSIVE METABOLIC PANEL
ALT: 13 IU/L (ref 0–44)
AST: 22 IU/L (ref 0–40)
Albumin/Globulin Ratio: 2.1 (ref 1.2–2.2)
Albumin: 4.6 g/dL (ref 3.8–4.8)
Alkaline Phosphatase: 75 IU/L (ref 39–117)
BUN/Creatinine Ratio: 17 (ref 10–24)
BUN: 20 mg/dL (ref 8–27)
Bilirubin Total: 0.8 mg/dL (ref 0.0–1.2)
CO2: 26 mmol/L (ref 20–29)
Calcium: 9.8 mg/dL (ref 8.6–10.2)
Chloride: 104 mmol/L (ref 96–106)
Creatinine, Ser: 1.15 mg/dL (ref 0.76–1.27)
GFR calc Af Amer: 74 mL/min/{1.73_m2} (ref >59)
GFR calc non Af Amer: 64 mL/min/{1.73_m2} (ref >59)
Globulin, Total: 2.2 g/dL (ref 1.5–4.5)
Glucose: 107 mg/dL — ABNORMAL HIGH (ref 65–99)
Potassium: 4.9 mmol/L (ref 3.5–5.2)
Sodium: 143 mmol/L (ref 134–144)
Total Protein: 6.8 g/dL (ref 6.0–8.5)

## 2019-01-31 LAB — LIPID PANEL
Chol/HDL Ratio: 2.3 ratio (ref 0.0–5.0)
Cholesterol, Total: 151 mg/dL (ref 100–199)
HDL: 67 mg/dL (ref >39)
LDL Chol Calc (NIH): 67 mg/dL (ref 0–99)
Triglycerides: 94 mg/dL (ref 0–149)
VLDL Cholesterol Cal: 17 mg/dL (ref 5–40)

## 2019-02-06 DIAGNOSIS — L570 Actinic keratosis: Secondary | ICD-10-CM | POA: Diagnosis not present

## 2019-02-06 DIAGNOSIS — L814 Other melanin hyperpigmentation: Secondary | ICD-10-CM | POA: Diagnosis not present

## 2019-02-06 DIAGNOSIS — L821 Other seborrheic keratosis: Secondary | ICD-10-CM | POA: Diagnosis not present

## 2019-02-06 DIAGNOSIS — D1801 Hemangioma of skin and subcutaneous tissue: Secondary | ICD-10-CM | POA: Diagnosis not present

## 2019-02-06 DIAGNOSIS — D225 Melanocytic nevi of trunk: Secondary | ICD-10-CM | POA: Diagnosis not present

## 2019-02-12 ENCOUNTER — Telehealth: Payer: Self-pay | Admitting: Gastroenterology

## 2019-02-12 MED ORDER — LUBIPROSTONE 24 MCG PO CAPS: 24.0000 ug | ORAL_CAPSULE | Freq: Two times a day (BID) | ORAL | 3 refills | Status: DC

## 2019-02-12 NOTE — Telephone Encounter (Signed)
Calling in [place]

## 2019-03-10 ENCOUNTER — Other Ambulatory Visit: Payer: Self-pay

## 2019-03-10 MED ORDER — LUBIPROSTONE 24 MCG PO CAPS: 24.0000 ug | ORAL_CAPSULE | Freq: Two times a day (BID) | ORAL | 3 refills | Status: AC

## 2019-03-27 ENCOUNTER — Other Ambulatory Visit: Payer: Self-pay

## 2019-03-28 ENCOUNTER — Other Ambulatory Visit: Payer: Self-pay

## 2019-03-28 ENCOUNTER — Ambulatory Visit (INDEPENDENT_AMBULATORY_CARE_PROVIDER_SITE_OTHER): Payer: PPO | Admitting: Medical

## 2019-03-28 ENCOUNTER — Encounter: Payer: Self-pay | Admitting: Medical

## 2019-03-28 VITALS — BP 134/70 | HR 59 | Temp 97.0°F | Resp 16 | Ht 68.0 in | Wt 153.4 lb

## 2019-03-28 DIAGNOSIS — K5900 Constipation, unspecified: Secondary | ICD-10-CM | POA: Diagnosis not present

## 2019-03-28 DIAGNOSIS — R972 Elevated prostate specific antigen [PSA]: Secondary | ICD-10-CM | POA: Diagnosis not present

## 2019-03-28 DIAGNOSIS — Z125 Encounter for screening for malignant neoplasm of prostate: Secondary | ICD-10-CM

## 2019-03-28 DIAGNOSIS — Z87898 Personal history of other specified conditions: Secondary | ICD-10-CM

## 2019-03-28 DIAGNOSIS — R251 Tremor, unspecified: Secondary | ICD-10-CM | POA: Diagnosis not present

## 2019-03-28 DIAGNOSIS — E785 Hyperlipidemia, unspecified: Secondary | ICD-10-CM

## 2019-03-28 DIAGNOSIS — D649 Anemia, unspecified: Secondary | ICD-10-CM | POA: Diagnosis not present

## 2019-03-28 LAB — CBC WITH DIFFERENTIAL/PLATELET
Basophils Absolute: 0.1 10*3/uL (ref 0.0–0.1)
Basophils Relative: 1.2 % (ref 0.0–3.0)
Eosinophils Absolute: 0.3 10*3/uL (ref 0.0–0.7)
Eosinophils Relative: 6.6 % — ABNORMAL HIGH (ref 0.0–5.0)
HCT: 39.6 % (ref 39.0–52.0)
Hemoglobin: 12.9 g/dL — ABNORMAL LOW (ref 13.0–17.0)
Lymphocytes Relative: 24.1 % (ref 12.0–46.0)
Lymphs Abs: 1 10*3/uL (ref 0.7–4.0)
MCHC: 32.5 g/dL (ref 30.0–36.0)
MCV: 96.7 fl (ref 78.0–100.0)
Monocytes Absolute: 0.6 10*3/uL (ref 0.1–1.0)
Monocytes Relative: 12.9 % — ABNORMAL HIGH (ref 3.0–12.0)
Neutro Abs: 2.4 10*3/uL (ref 1.4–7.7)
Neutrophils Relative %: 55.2 % (ref 43.0–77.0)
Platelets: 166 10*3/uL (ref 150.0–400.0)
RBC: 4.09 Mil/uL — ABNORMAL LOW (ref 4.22–5.81)
RDW: 13.3 % (ref 11.5–15.5)
WBC: 4.3 10*3/uL (ref 4.0–10.5)

## 2019-03-28 LAB — LIPID PANEL
Cholesterol: 146 mg/dL (ref 0–200)
HDL: 54.6 mg/dL (ref >39.00)
LDL Cholesterol: 76 mg/dL (ref 0–99)
NonHDL: 91.52
Total CHOL/HDL Ratio: 3
Triglycerides: 77 mg/dL (ref 0.0–149.0)
VLDL: 15.4 mg/dL (ref 0.0–40.0)

## 2019-03-28 LAB — COMPREHENSIVE METABOLIC PANEL
ALT: 14 U/L (ref 0–53)
AST: 18 U/L (ref 0–37)
Albumin: 4.3 g/dL (ref 3.5–5.2)
Alkaline Phosphatase: 91 U/L (ref 39–117)
BUN: 13 mg/dL (ref 6–23)
CO2: 29 mEq/L (ref 19–32)
Calcium: 9.4 mg/dL (ref 8.4–10.5)
Chloride: 105 mEq/L (ref 96–112)
Creatinine, Ser: 1.07 mg/dL (ref 0.40–1.50)
GFR: 68.29 mL/min (ref >60.00)
Glucose, Bld: 98 mg/dL (ref 70–99)
Potassium: 5 mEq/L (ref 3.5–5.1)
Sodium: 141 mEq/L (ref 135–145)
Total Bilirubin: 1.1 mg/dL (ref 0.2–1.2)
Total Protein: 6.5 g/dL (ref 6.0–8.3)

## 2019-03-28 LAB — PSA: PSA: 3.69 ng/mL (ref 0.10–4.00)

## 2019-03-28 NOTE — Progress Notes (Signed)
Subjective:    Patient ID: Hunter Vega, male    DOB: 1950/08/06, 68 y.o.   MRN: [id number]  HPI   Pt is fasting. Well hydrated per his report.  Pt has been getting some exercise  playing pickle ball. Pt states he lost some weight liklely muscle mass as he was working out 3-4 days a week before pandemic.   Pt states he developed tremor at rest about 2 weeks ago. Minimal on rt side. Pt is left hand. Pt has no change in gait. No balance issues/no falls. No family history of parkinsons. But his mom did have a tremor that developed when she was older. No coordination problems playing pickle ball.  3 weeks ago he had slight tremors. He does have hx of remote seizures.he had absense seizures in past. Would get confused. With this tremor this does not occur.  Recently he thinks more constipated. Amitiza not working.      Review of Systems  Constitutional: Negative for chills, fatigue and fever.  HENT: Negative for congestion, drooling and ear discharge.   Eyes: Negative for photophobia and pain.  Respiratory: Negative for cough, chest tightness, shortness of breath and wheezing.   Cardiovascular: Negative for chest pain and palpitations.  Gastrointestinal: Negative for abdominal distention, constipation and nausea.       Pt on protonix and sees gi md who rx'z linzess and amitiza.  Pt has some chronic constipation type symptoms. He used dulcolax recently and helped/resolved most recent constipation event.  Genitourinary: Negative for dysuria, flank pain, frequency and urgency.  Musculoskeletal: Negative for back pain, gait problem, neck pain and neck stiffness.  Skin: Negative for rash.  Neurological: Negative for dizziness, speech difficulty, weakness, numbness and headaches.  Hematological: Negative for adenopathy. Does not bruise/bleed easily.  Psychiatric/Behavioral: Negative for behavioral problems, confusion, dysphoric mood and sleep disturbance. The patient is not  nervous/anxious.     Past Medical History:  Diagnosis Date  . Allergy    mild seasonal  . Constipation    started Linzess monday 08-12-2018   . History of seizure    reports no seizure since 2015   . Hx of exercise stress test    reports  had done to eval since coronary artery stent was placed ; reports test was "normal"   . Hyperlipidemia   . PSVT (paroxysmal supraventricular tachycardia) ([place])    reports no knowledge of this   . Seizures ([place])    last 2015  . Sleep apnea    reports no longer has sicne losing weight      Social History   Socioeconomic History  . Marital status: Married    Spouse name: Not on file  . Number of children: 3  . Years of education: Not on file  . Highest education level: Not on file  Occupational History  . Not on file  Social Needs  . Financial resource strain: Not on file  . Food insecurity    Worry: Not on file    Inability: Not on file  . Transportation needs    Medical: Not on file    Non-medical: Not on file  Tobacco Use  . Smoking status: Never Smoker  . Smokeless tobacco: Never Used  Substance and Sexual Activity  . Alcohol use: No  . Drug use: No  . Sexual activity: Yes  Lifestyle  . Physical activity    Days per week: Not on file    Minutes per session: Not on file  . Stress:  Not on file  Relationships  . Social Herbalist on phone: Not on file    Gets together: Not on file    Attends religious service: Not on file    Active member of club or organization: Not on file    Attends meetings of clubs or organizations: Not on file    Relationship status: Not on file  . Intimate partner violence    Fear of current or ex partner: Not on file    Emotionally abused: Not on file    Physically abused: Not on file    Forced sexual activity: Not on file  Other Topics Concern  . Not on file  Social History Narrative   Lives with wife   Caffeine use: 1 cup corfee daily   Left handed     Past Surgical History:   Procedure Laterality Date  . CARDIAC CATHETERIZATION    . COLONOSCOPY    . CORONARY ANGIOPLASTY    . CORONARY STENT PLACEMENT  2015   x2. Mid LAD  . INGUINAL HERNIA REPAIR Bilateral 09/26/2017   Procedure: LAPAROSCOPIC BILATERAL INGUINAL HERNIA REPAIR;  Surgeon: Michael Boston, MD;  Location: [facility];  Service: General;  Laterality: Bilateral;  . INSERTION OF MESH Bilateral 09/26/2017   Procedure: INSERTION OF MESH;  Surgeon: Michael Boston, MD;  Location: [facility];  Service: General;  Laterality: Bilateral;    Family History  Problem Relation Age of Onset  . Liver disease Mother   . Heart attack Father   . Diabetes Paternal Uncle   . Colon cancer Neg Hx   . Esophageal cancer Neg Hx   . Prostate cancer Neg Hx   . Colon polyps Neg Hx   . Rectal cancer Neg Hx   . Stomach cancer Neg Hx     Allergies  Allergen Reactions  . Penicillins Swelling    Arm swelling Has patient had a PCN reaction causing immediate rash, facial/tongue/throat swelling, SOB or lightheadedness with hypotension: No Has patient had a PCN reaction causing severe rash involving mucus membranes or skin necrosis: No Has patient had a PCN reaction that required hospitalization: No Has patient had a PCN reaction occurring within the last 10 years: No If all of the above answers are "NO", then may proceed with Cephalosporin use.     Current Outpatient Medications on File Prior to Visit  Medication Sig Dispense Refill  . aspirin EC 81 MG tablet Take 81 mg by mouth daily.     Marland Kitchen atorvastatin (LIPITOR) 80 MG tablet Take 1 tablet (80 mg total) by mouth at bedtime. 90 tablet 3  . lamoTRIgine (LAMICTAL) 150 MG tablet Take 1 tablet (150 mg total) by mouth 2 (two) times daily. 180 tablet 3  . lubiprostone (AMITIZA) 24 MCG capsule Take 1 capsule (24 mcg total) by mouth 2 (two) times daily with a meal. 180 capsule 3  . pantoprazole (PROTONIX) 40 MG tablet Take 1 tablet (40 mg total) by mouth at bedtime. 90 tablet 3   No current  facility-administered medications on file prior to visit.     BP (!) 151/61   Pulse (!) 59   Temp (!) 97 F (36.1 C) (Temporal)   Resp 16   Ht 5\' 8"  (1.727 m)   Wt 153 lb 6.4 oz (69.6 kg)   SpO2 100%   BMI 23.32 kg/m       Objective:   Physical Exam  General Mental Status- Alert. General Appearance- Not in acute distress.  Skin General: Color- Normal Color. Moisture- Normal Moisture.  Neck Carotid Arteries- Normal color. Moisture- Normal Moisture. No carotid bruits. No JVD.  Chest and Lung Exam Auscultation: Breath Sounds:-Normal.  Cardiovascular Auscultation:Rythm- Regular. Murmurs & Other Heart Sounds:Auscultation of the heart reveals- No Murmurs.  Abdomen Inspection:-Inspeection Normal. Palpation/Percussion:Note:No mass. Palpation and Percussion of the abdomen reveal- Non Tender, Non Distended + BS, no rebound or guarding.   Neurologic Cranial Nerve exam:- CN III-XII intact(No nystagmus), symmetric smile. Drift Test:- No drift. Romberg Exam:- Negative.  Heal to Toe Gait exam:-Normal. Finger to Nose:- Normal/Intact Strength:- 5/5 equal and symmetric strength both upper and lower extremities. On inspection can appreciate mild resting tremor rt hand. No rigidity of upper or lower extremities. Smooth gross motor function.      Assessment & Plan:  For high cholesterol will get cmp and lipid panel.  For tremor and hx of seizure will refer you back to your neurologist. Diff dx discussed see attached education sheet. You had very good neurologic exam though resting tremor noted.  Will get psa today.  For constipation continue with Gi MD recommendation. I would notify him that recently amitiza seems not to be working. They may give you sample of new med if available.  Continue to exercise regularly.  Follow up date to be determined after lab review.  40 minutes spent with pt. 50% of time spent counseling on plan gong forward. Couneled/discussed majority of  time regarding constipation and tremor.  [name], PA-C

## 2019-03-28 NOTE — Telephone Encounter (Signed)
Dr [name] do you want to resend Linzess 290 mcg?

## 2019-03-28 NOTE — Patient Instructions (Signed)
For high cholesterol will get cmp and lipid panel.  For tremor and hx of seizure will refer you back to your neurologist. Diff dx discussed see attached education sheet. You had very good neurologic exam though resting tremor noted.  Will get psa today.  For constipation continue with Gi MD recommendation. I would notify him that recently amitiza seems not to be working. They may give you sample of new med if available.  Continue to exercise regularly.  Follow up date to be determined after lab review.

## 2019-03-31 ENCOUNTER — Telehealth: Payer: Self-pay

## 2019-03-31 MED ORDER — LINACLOTIDE 290 MCG PO CAPS: 290.0000 ug | ORAL_CAPSULE | Freq: Every day | ORAL | 6 refills | Status: DC

## 2019-03-31 NOTE — Telephone Encounter (Signed)
Can you call him please. I think it would be good that he retry Linzess 290 mg pills. Please prescribe 1 month with 6 refills. I also want him to be taking every single day 1 or 2 doses of MiraLAX. Thanks   Previous Messages  ----- Message -----  From: Timothy Lasso, RN  Sent: 03/28/2019  2:25 PM EST  To: Milus Banister, MD  Subject: Non-Urgent Medical Question            ----- Message from Timothy Lasso, RN sent at 03/28/2019 2:25 PM EDT -----    ----- Message from Hunter Mor "Ed" to Milus Banister, MD sent at 03/28/2019 2:18 PM -----  I have had continuous episodes of constipation over the last year. Dr. [name] put me on Linzess . This was the 290 mg. That was working to the point that he wanted to try the lesser (145mg ) . I started them on September 1. This mg did not work so I was put back on the 290mg  Linzess. After a short period this also produced limited results. Since 10-12 I started on Amitiza .Again this was working until 10-26. After four days without a BM I had to take three Dulcolax . I just had my yearly physical with Evern Core. He recommended I contact Dr. Eugenia Pancoast office to see if another medicine might be available to try or try going back to the [facility] (290mg ). Also if there is another medicine, if you might have some samples to try so we can see what the results may be before getting a full 30 days. I still have 16 Linzess capsules (290mg ) left from the [facility]ious Rx. My last BM was on 10-26.      Non-Urgent Medical Question  Milus Banister, MD  You 9 minutes ago (11:54 AM)   Can you call him please. I think it would be good that he retry Linzess 290 mg pills. Please prescribe 1 month with 6 refills. I also want him to be taking every single day 1 or 2 doses of MiraLAX. Thanks   Message text     You routed conversation to Milus Banister, MD 3 days ago    You 3 days ago     Dr [name] do you want to resend Linzess 290 mcg?       Documentation     Hunter Mor "Ed"  Milus Banister, MD 3 days ago     I have had  continuous episodes of constipation over the last year. Dr. [name] put me on Linzess . This was the 290 mg. That was working  to the point that he wanted to try the lesser (145mg ) . I started them on September 1. This mg did not work so I was put back on the 290mg  Linzess. After a short period this also produced limited results. Since 10-12 I started on Amitiza .Again this was working until 10-26. After four days without a BM I had to take three Dulcolax . I just had my yearly physical with Evern Core. He recommended I contact Dr. Eugenia Pancoast office to see if another medicine might be available to try or try going back to the [facility] (290mg ). Also if there is another medicine, if you might have some samples to try so we can see what the results may be before getting a full 30 days. I still have 16 Linzess capsules (290mg ) left from the [facility] Rx. My last BM was on 10-26.

## 2019-03-31 NOTE — Telephone Encounter (Signed)
The pt has been contacted and notified that prescription has been sent and he should call if he does not get good response.

## 2019-04-03 ENCOUNTER — Ambulatory Visit: Payer: PPO | Admitting: *Deleted

## 2019-04-23 ENCOUNTER — Other Ambulatory Visit: Payer: Self-pay

## 2019-05-08 DIAGNOSIS — H16223 Keratoconjunctivitis sicca, not specified as Sjogren's, bilateral: Secondary | ICD-10-CM | POA: Diagnosis not present

## 2019-05-08 DIAGNOSIS — H2513 Age-related nuclear cataract, bilateral: Secondary | ICD-10-CM | POA: Diagnosis not present

## 2019-05-08 DIAGNOSIS — H40003 Preglaucoma, unspecified, bilateral: Secondary | ICD-10-CM | POA: Diagnosis not present

## 2019-05-15 ENCOUNTER — Encounter: Payer: Self-pay | Admitting: *Deleted

## 2019-05-19 ENCOUNTER — Ambulatory Visit: Payer: PPO | Attending: Internal Medicine

## 2019-05-19 DIAGNOSIS — Z20822 Contact with and (suspected) exposure to covid-19: Secondary | ICD-10-CM

## 2019-05-19 DIAGNOSIS — Z20828 Contact with and (suspected) exposure to other viral communicable diseases: Secondary | ICD-10-CM | POA: Diagnosis not present

## 2019-05-20 LAB — NOVEL CORONAVIRUS, NAA: SARS-CoV-2, NAA: NOT DETECTED

## 2019-06-12 DIAGNOSIS — H2513 Age-related nuclear cataract, bilateral: Secondary | ICD-10-CM | POA: Diagnosis not present

## 2019-06-12 DIAGNOSIS — H16223 Keratoconjunctivitis sicca, not specified as Sjogren's, bilateral: Secondary | ICD-10-CM | POA: Diagnosis not present

## 2019-06-12 DIAGNOSIS — H40003 Preglaucoma, unspecified, bilateral: Secondary | ICD-10-CM | POA: Diagnosis not present

## 2019-06-17 ENCOUNTER — Encounter: Payer: Self-pay | Admitting: Medical

## 2019-06-19 ENCOUNTER — Ambulatory Visit: Payer: PPO | Admitting: Neurology

## 2019-06-19 ENCOUNTER — Other Ambulatory Visit: Payer: Self-pay

## 2019-06-19 ENCOUNTER — Encounter: Payer: Self-pay | Admitting: Neurology

## 2019-06-19 VITALS — BP 156/77 | HR 69 | Temp 97.5°F | Ht 68.0 in | Wt 155.0 lb

## 2019-06-19 DIAGNOSIS — Z5181 Encounter for therapeutic drug level monitoring: Secondary | ICD-10-CM | POA: Diagnosis not present

## 2019-06-19 DIAGNOSIS — G20 Parkinson's disease: Secondary | ICD-10-CM | POA: Diagnosis not present

## 2019-06-19 DIAGNOSIS — G20A1 Parkinson's disease without dyskinesia, without mention of fluctuations: Secondary | ICD-10-CM

## 2019-06-19 DIAGNOSIS — G40909 Epilepsy, unspecified, not intractable, without status epilepticus: Secondary | ICD-10-CM

## 2019-06-19 MED ORDER — LAMOTRIGINE 150 MG PO TABS: 150.0000 mg | ORAL_TABLET | Freq: Two times a day (BID) | ORAL | 3 refills | Status: DC

## 2019-06-19 NOTE — Progress Notes (Signed)
Reason for visit: Seizures, new onset tremor  Hunter Vega is an 69 y.o. male  History of present illness:  Hunter Vega is a 69 year old left-handed white male with a history of seizures that have been well controlled on Lamictal.  He comes to the office today for evaluation of a tremor that began about 3 or 4 months ago.  The patient notes that the tremor is only on the right hand, the patient is left-handed.  The patient has minimal change in his handwriting, may be the handwriting slightly more sloppy.  He has not noted any changes in his walking or balance.  He has had no change in speech or swallowing.  He has developed some problems with constipation over the last 18 months.  He denies any family history of tremor.  He returns to the office today as he is concerned about the tremor.  The tremor is a pure resting tremor, it goes away when he uses his hand.  He remains active, he runs 3 to 4 miles a day.  Past Medical History:  Diagnosis Date  . Allergy    mild seasonal  . Constipation    started Linzess monday 08-12-2018   . History of seizure    reports no seizure since 2015   . Hx of exercise stress test    reports  had done to eval since coronary artery stent was placed ; reports test was "normal"   . Hyperlipidemia   . PSVT (paroxysmal supraventricular tachycardia) ([place])    reports no knowledge of this   . Seizures ([place])    last 2015  . Sleep apnea    reports no longer has sicne losing weight     Past Surgical History:  Procedure Laterality Date  . CARDIAC CATHETERIZATION    . COLONOSCOPY    . CORONARY ANGIOPLASTY    . CORONARY STENT PLACEMENT  2015   x2. Mid LAD  . INGUINAL HERNIA REPAIR Bilateral 09/26/2017   Procedure: LAPAROSCOPIC BILATERAL INGUINAL HERNIA REPAIR;  Surgeon: Michael Boston, MD;  Location: [facility];  Service: General;  Laterality: Bilateral;  . INSERTION OF MESH Bilateral 09/26/2017   Procedure: INSERTION OF MESH;  Surgeon: Michael Boston, MD;   Location: [facility];  Service: General;  Laterality: Bilateral;    Family History  Problem Relation Age of Onset  . Liver disease Mother   . Heart attack Father   . Diabetes Paternal Uncle   . Colon cancer Neg Hx   . Esophageal cancer Neg Hx   . Prostate cancer Neg Hx   . Colon polyps Neg Hx   . Rectal cancer Neg Hx   . Stomach cancer Neg Hx     Social history:  reports that he has never smoked. He has never used smokeless tobacco. He reports that he does not drink alcohol or use drugs.    Allergies  Allergen Reactions  . Penicillins Swelling    Arm swelling Has patient had a PCN reaction causing immediate rash, facial/tongue/throat swelling, SOB or lightheadedness with hypotension: No Has patient had a PCN reaction causing severe rash involving mucus membranes or skin necrosis: No Has patient had a PCN reaction that required hospitalization: No Has patient had a PCN reaction occurring within the last 10 years: No If all of the above answers are "NO", then may proceed with Cephalosporin use.     Medications:  Prior to Admission medications   Medication Sig Start Date End Date Taking? Authorizing Provider  aspirin  EC 81 MG tablet Take 81 mg by mouth daily.    Yes [provider]  atorvastatin (LIPITOR) 80 MG tablet Take 1 tablet (80 mg total) by mouth at bedtime. 01/30/19  Yes Richardo Priest, MD  lamoTRIgine (LAMICTAL) 150 MG tablet Take 1 tablet (150 mg total) by mouth 2 (two) times daily. 09/17/18  Yes Kathrynn Ducking, MD  linaclotide [facility]) 290 MCG CAPS capsule Take 1 capsule (290 mcg total) by mouth daily before breakfast. 03/31/19  Yes Milus Banister, MD    ROS:  Out of a complete 14 system review of symptoms, the patient complains only of the following symptoms, and all other reviewed systems are negative.  Tremor  Blood pressure (!) 156/77, pulse 69, temperature (!) 97.5 F (36.4 C), height 5\' 8"  (1.727 m), weight 155 lb (70.3 kg).  Physical  Exam  General: The patient is alert and cooperative at the time of the examination.  Skin: No significant peripheral edema is noted.   Neurologic Exam  Mental status: The patient is alert and oriented x 3 at the time of the examination. The patient has apparent normal recent and remote memory, with an apparently normal attention span and concentration ability.   Cranial nerves: Facial symmetry is present. Speech is normal, no aphasia or dysarthria is noted. Extraocular movements are full. Visual fields are full.  There may be possibly some masking of the face.  Motor: The patient has good strength in all 4 extremities.  Sensory examination: Soft touch sensation is symmetric on the face, arms, and legs.  Coordination: The patient has good finger-nose-finger and heel-to-shin bilaterally.  Resting tremors noted on the right arm, this is present while walking.  Gait and station: The patient has a normal gait.  The patient has good arm swing with walking, good turns.  He is able to rise from a seated position with the arms crossed.  Tandem gait is slightly unsteady. Romberg is negative. No drift is seen.  Reflexes: Deep tendon reflexes are symmetric.   Assessment/Plan:  1.  Right upper extremity tremor, possible Parkinson's disease  2.  History of seizures, well controlled  The patient does have some risk factors for cerebrovascular disease, we will check MRI of the brain.  The patient is doing well with the seizures, we will continue the Lamictal.  We will follow the patient conservatively over time looking for developing signs of Parkinson's disease.  The right upper extremity tremor is fully consistent with Parkinson's disease, but the patient has excellent arm swing bilaterally with walking.  He will follow-up in 6 months.  Jill Alexanders MD 06/19/2019 9:48 AM  [facility] [address] [place], [place] [address]  Phone [phone] Fax  [phone]

## 2019-06-23 ENCOUNTER — Encounter: Payer: Self-pay | Admitting: Medical

## 2019-06-29 ENCOUNTER — Encounter: Payer: Self-pay | Admitting: Medical

## 2019-07-08 ENCOUNTER — Encounter: Payer: Self-pay | Admitting: Medical

## 2019-07-10 ENCOUNTER — Other Ambulatory Visit: Payer: PPO

## 2019-07-14 ENCOUNTER — Ambulatory Visit
Admission: RE | Admit: 2019-07-14 | Discharge: 2019-07-14 | Disposition: A | Discharge status: Home / Self Care | Payer: PPO | Source: Ambulatory Visit | Attending: Neurology | Admitting: Neurology

## 2019-07-14 ENCOUNTER — Other Ambulatory Visit: Payer: Self-pay

## 2019-07-14 DIAGNOSIS — G20 Parkinson's disease: Secondary | ICD-10-CM | POA: Diagnosis not present

## 2019-07-14 DIAGNOSIS — G40909 Epilepsy, unspecified, not intractable, without status epilepticus: Secondary | ICD-10-CM

## 2019-07-15 ENCOUNTER — Telehealth: Payer: Self-pay | Admitting: Neurology

## 2019-07-15 NOTE — Telephone Encounter (Signed)
I called the patient.  MRI of the brain is relatively unremarkable, nothing that would be causing right upper extremity tremor, will need to reevaluate the patient intermittently for any developing signs of parkinsonism.   MRI brain 07/14/19:  IMPRESSION: This MRI of the brain without contrast shows the following: 1.  Scattered T2/FLAIR hyperintense foci in the subcortical and deep white matter.  None of these appear to be acute and most were present on the MRI from 2014.  This is most consistent with mild chronic microvascular ischemic change.  Demyelination or vasculitis is less likely. 2.   Brain volume is normal for age. 3.   There are no acute findings.

## 2019-09-18 ENCOUNTER — Ambulatory Visit: Payer: PPO | Admitting: Neurology

## 2019-10-13 NOTE — Progress Notes (Addendum)
I connected with Hunter Vega today by telephone and verified that I am speaking with the correct person using two identifiers. Location patient: home Location provider: work Persons participating in the virtual visit: patient, provider.   I discussed the limitations, risks, security and privacy concerns of performing an evaluation and management service by telephone and the availability of in person appointments. I also discussed with the patient that there may be a patient responsible charge related to this service. The patient expressed understanding and verbally consented to this telephonic visit.    Interactive audio and video telecommunications were attempted between this provider and patient, however failed, due to patient having technical difficulties OR patient did not have access to video capability.  We continued and completed visit with audio only.  Some vital signs may be absent or patient reported.    Subjective:   Hunter Vega is a 69 y.o. male who presents for Medicare Annual/Subsequent preventive examination.  Review of Systems:    Home Safety/Smoke Alarms: Feels safe in home. Smoke alarms in place.  Lives with wife in 2 story home. Does well w/ stairs.   Male:   CCS-  Next due 07/2025   PSA-  Lab Results  Component Value Date   PSA 3.69 03/28/2019   PSA 3.88 03/26/2018   Eye-Dr.Samuels yearly. UTD per pt.      Objective:    Vitals:  Unable to assess. This visit is enabled though telemedicine due to Covid 19.   Advanced Directives 10/14/2019 08/16/2018 08/14/2018 09/18/2017 05/24/2017 11/23/2016 08/22/2016  Does Patient Have a Medical Advance Directive? Yes Yes Yes Yes Yes Yes Yes  Type of Paramedic of [place];Living will Living will [place];Living will Living will [place];Living will - [place];Living will  Does patient want to make changes to medical advance directive? No - Patient  declined No - Patient declined - No - Patient declined - No - Patient declined -  Copy of [place] in Chart? No - copy requested - - - No - copy requested - -    Tobacco Social History   Tobacco Use  Smoking Status Never Smoker  Smokeless Tobacco Never Used     Counseling given: Not Answered   Clinical Intake:     Pain : No/denies pain                 Past Medical History:  Diagnosis Date  . Allergy    mild seasonal  . Constipation    started Linzess monday 08-12-2018   . History of seizure    reports no seizure since 2015   . Hx of exercise stress test    reports  had done to eval since coronary artery stent was placed ; reports test was "normal"   . Hyperlipidemia   . PSVT (paroxysmal supraventricular tachycardia) ([place])    reports no knowledge of this   . Seizures ([place])    last 2015  . Sleep apnea    reports no longer has sicne losing weight    Past Surgical History:  Procedure Laterality Date  . CARDIAC CATHETERIZATION    . COLONOSCOPY    . CORONARY ANGIOPLASTY    . CORONARY STENT PLACEMENT  2015   x2. Mid LAD  . INGUINAL HERNIA REPAIR Bilateral 09/26/2017   Procedure: LAPAROSCOPIC BILATERAL INGUINAL HERNIA REPAIR;  Surgeon: Michael Boston, MD;  [facility];  Service: General;  Laterality: Bilateral;  . INSERTION  OF MESH Bilateral 09/26/2017   Procedure: INSERTION OF MESH;  Surgeon: Michael Boston, MD;  [facility];  Service: General;  Laterality: Bilateral;   Family History  Problem Relation Age of Onset  . Liver disease Mother   . Heart attack Father   . Diabetes Paternal Uncle   . Colon cancer Neg Hx   . Esophageal cancer Neg Hx   . Prostate cancer Neg Hx   . Colon polyps Neg Hx   . Rectal cancer Neg Hx   . Stomach cancer Neg Hx    Social History   Socioeconomic History  . Marital status: Married    Spouse name: Not on file  . Number of children: 3  . Years of education: Not on file  . Highest education level:  Not on file  Occupational History  . Not on file  Tobacco Use  . Smoking status: Never Smoker  . Smokeless tobacco: Never Used  Substance and Sexual Activity  . Alcohol use: No  . Drug use: No  . Sexual activity: Yes  Other Topics Concern  . Not on file  Social History Narrative   Lives with wife   Caffeine use: 1 cup corfee daily   Left handed    Social Determinants of Health   Financial Resource Strain:   . Difficulty of Paying Living Expenses:   Food Insecurity:   . Worried About Charity fundraiser in the Last Year:   . Arboriculturist in the Last Year:   Transportation Needs:   . Film/video editor (Medical):   Marland Kitchen Lack of Transportation (Non-Medical):   Physical Activity:   . Days of Exercise per Week:   . Minutes of Exercise per Session:   Stress:   . Feeling of Stress :   Social Connections:   . Frequency of Communication with Friends and Family:   . Frequency of Social Gatherings with Friends and Family:   . Attends Religious Services:   . Active Member of Clubs or Organizations:   . Attends Archivist Meetings:   Marland Kitchen Marital Status:     Outpatient Encounter Medications as of 10/14/2019  Medication Sig  . aspirin EC 81 MG tablet Take 81 mg by mouth daily.   Marland Kitchen atorvastatin (LIPITOR) 80 MG tablet Take 1 tablet (80 mg total) by mouth at bedtime.  . lamoTRIgine (LAMICTAL) 150 MG tablet Take 1 tablet (150 mg total) by mouth 2 (two) times daily.  Marland Kitchen linaclotide (LINZESS) 290 MCG CAPS capsule Take 1 capsule (290 mcg total) by mouth daily before breakfast.   No facility-administered encounter medications on file as of 10/14/2019.    Activities of Daily Living In your present state of health, do you have any difficulty performing the following activities: 10/14/2019  Hearing? N  Vision? N  Difficulty concentrating or making decisions? N  Walking or climbing stairs? N  Dressing or bathing? N  Doing errands, shopping? N  Preparing Food and eating ? N    Using the Toilet? N  In the past six months, have you accidently leaked urine? N  Do you have problems with loss of bowel control? N  Managing your Medications? N  Managing your Finances? N  Housekeeping or managing your Housekeeping? N  Some recent data might be hidden    Patient Care Team: Saguier, Iris Pert as PCP - General (Internal Medicine) Michael Boston, MD as Consulting Physician (General Surgery) Kathrynn Ducking, MD as Consulting Physician (Neurology) Shirlee More  J, MD as Consulting Physician (Cardiology)   Assessment:   This is a routine wellness examination for [place]. Physical assessment deferred to PCP.  Exercise Activities and Dietary recommendations Current Exercise Habits: The patient does not participate in regular exercise at present, Frequency (Times/Week): 3, Exercise limited by: None identified   Diet (meal preparation, eat out, water intake, caffeinated beverages, dairy products, fruits and vegetables): well balanced      Goals    . Maintain current healthy lifestyle  (pt-stated)       Fall Risk Fall Risk  10/14/2019 04/23/2019 05/24/2017 08/22/2016 07/25/2016  Falls in the past year? 0 0 No No No  Comment - Emmi Telephone Survey: data to providers prior to load - - -  Number falls in past yr: 0 - - - -  Injury with Fall? 0 - - - -  Follow up Education provided;Falls prevention discussed - - - -     Depression Screen PHQ 2/9 Scores 05/24/2017 08/22/2016 07/25/2016  PHQ - 2 Score 0 0 0    Cognitive Function Ad8 score reviewed for issues:  Issues making decisions:no  Less interest in hobbies / activities:no  Repeats questions, stories (family complaining):no  Trouble using ordinary gadgets (microwave, computer, phone):no  Forgets the month or year: no  Mismanaging finances: no  Remembering appts:no  Daily problems with thinking and/or memory:no Ad8 score is=0         Immunization History  Administered Date(s) Administered  .  Influenza, High Dose Seasonal PF 03/22/2017, 03/26/2018, 02/06/2019  . Influenza-Unspecified 03/10/2019  . PFIZER SARS-COV-2 Vaccination 06/17/2019, 07/08/2019  . Pneumococcal Conjugate-13 03/26/2018  . Td 05/29/2009  . Zoster 01/02/2014    Screening Tests Health Maintenance  Topic Date Due  . PNA vac Low Risk Adult (2 of 2 - PPSV23) 03/27/2019  . TETANUS/TDAP  05/30/2019  . Hepatitis C Screening  05/29/2048 (Originally 10/20/1950)  . INFLUENZA VACCINE  12/28/2019  . COLONOSCOPY  08/15/2025  . COVID-19 Vaccine  Completed     Plan:    Please schedule your next medicare wellness visit with me in 1 yr.  Continue to eat heart healthy diet (full of fruits, vegetables, whole grains, lean protein, water--limit salt, fat, and sugar intake) and increase physical activity as tolerated.  Continue doing brain stimulating activities (puzzles, reading, adult coloring books, staying active) to keep memory sharp.     I have personally reviewed and noted the following in the patient's chart:   . Medical and social history . Use of alcohol, tobacco or illicit drugs  . Current medications and supplements . Functional ability and status . Nutritional status . Physical activity . Advanced directives . List of other physicians . Hospitalizations, surgeries, and ER visits in previous 12 months . Vitals . Screenings to include cognitive, depression, and falls . Referrals and appointments  In addition, I have reviewed and discussed with patient certain preventive protocols, quality metrics, and best practice recommendations. A written personalized care plan for preventive services as well as general preventive health recommendations were provided to patient.     Shela Nevin, [place]  10/14/2019  Reviewed and Agree with Assessment & Plan of RN.  [name], PA-C

## 2019-10-14 ENCOUNTER — Ambulatory Visit (INDEPENDENT_AMBULATORY_CARE_PROVIDER_SITE_OTHER): Payer: PPO | Admitting: *Deleted

## 2019-10-14 ENCOUNTER — Other Ambulatory Visit: Payer: Self-pay

## 2019-10-14 ENCOUNTER — Encounter: Payer: Self-pay | Admitting: *Deleted

## 2019-10-14 DIAGNOSIS — Z0000 Encounter for general adult medical examination without abnormal findings: Secondary | ICD-10-CM | POA: Diagnosis not present

## 2019-10-14 NOTE — Patient Instructions (Signed)
Please schedule your next medicare wellness visit with me in 1 yr.  Continue to eat heart healthy diet (full of fruits, vegetables, whole grains, lean protein, water--limit salt, fat, and sugar intake) and increase physical activity as tolerated.  Continue doing brain stimulating activities (puzzles, reading, adult coloring books, staying active) to keep memory sharp.    Hunter Vega , Thank you for taking time to come for your Medicare Wellness Visit. I appreciate your ongoing commitment to your health goals. Please review the following plan we discussed and let me know if I can assist you in the future.   These are the goals we discussed: Goals    . Maintain current healthy lifestyle  (pt-stated)       This is a list of the screening recommended for you and due dates:  Health Maintenance  Topic Date Due  . Pneumonia vaccines (2 of 2 - PPSV23) 03/27/2019  . Tetanus Vaccine  05/30/2019  .  Hepatitis C: One time screening is recommended by Center for Disease Control  (CDC) for  adults born from [birth year] through 1967.   05/29/2048*  . Flu Shot  12/28/2019  . Colon Cancer Screening  08/15/2025  . COVID-19 Vaccine  Completed  *Topic was postponed. The date shown is not the original due date.    Preventive Care 30 Years and Older, Male Preventive care refers to lifestyle choices and visits with your health care provider that can promote health and wellness. This includes:  A yearly physical exam. This is also called an annual well check.  Regular dental and eye exams.  Immunizations.  Screening for certain conditions.  Healthy lifestyle choices, such as diet and exercise. What can I expect for my preventive care visit? Physical exam Your health care provider will check:  Height and weight. These may be used to calculate body mass index (BMI), which is a measurement that tells if you are at a healthy weight.  Heart rate and blood pressure.  Your skin for abnormal  spots. Counseling Your health care provider may ask you questions about:  Alcohol, tobacco, and drug use.  Emotional well-being.  Home and relationship well-being.  Sexual activity.  Eating habits.  History of falls.  Memory and ability to understand (cognition).  Work and work Statistician. What immunizations do I need?  Influenza (flu) vaccine  This is recommended every year. Tetanus, diphtheria, and pertussis (Tdap) vaccine  You may need a Td booster every 10 years. Varicella (chickenpox) vaccine  You may need this vaccine if you have not already been vaccinated. Zoster (shingles) vaccine  You may need this after age 74. Pneumococcal conjugate (PCV13) vaccine  One dose is recommended after age 93. Pneumococcal polysaccharide (PPSV23) vaccine  One dose is recommended after age 37. Measles, mumps, and rubella (MMR) vaccine  You may need at least one dose of MMR if you were born in 1957 or later. You may also need a second dose. Meningococcal conjugate (MenACWY) vaccine  You may need this if you have certain conditions. Hepatitis A vaccine  You may need this if you have certain conditions or if you travel or work in places where you may be exposed to hepatitis A. Hepatitis B vaccine  You may need this if you have certain conditions or if you travel or work in places where you may be exposed to hepatitis B. Haemophilus influenzae type b (Hib) vaccine  You may need this if you have certain conditions. You may receive vaccines as individual doses  or as more than one vaccine together in one shot (combination vaccines). Talk with your health care provider about the risks and benefits of combination vaccines. What tests do I need? Blood tests  Lipid and cholesterol levels. These may be checked every 5 years, or more frequently depending on your overall health.  Hepatitis C test.  Hepatitis B test. Screening  Lung cancer screening. You may have this screening  every year starting at age 84 if you have a 30-pack-year history of smoking and currently smoke or have quit within the past 15 years.  Colorectal cancer screening. All adults should have this screening starting at age 37 and continuing until age 1. Your health care provider may recommend screening at age 29 if you are at increased risk. You will have tests every 1-10 years, depending on your results and the type of screening test.  Prostate cancer screening. Recommendations will vary depending on your family history and other risks.  Diabetes screening. This is done by checking your blood sugar (glucose) after you have not eaten for a while (fasting). You may have this done every 1-3 years.  Abdominal aortic aneurysm (AAA) screening. You may need this if you are a current or former smoker.  Sexually transmitted disease (STD) testing. Follow these instructions at home: Eating and drinking  Eat a diet that includes fresh fruits and vegetables, whole grains, lean protein, and low-fat dairy products. Limit your intake of foods with high amounts of sugar, saturated fats, and salt.  Take vitamin and mineral supplements as recommended by your health care provider.  Do not drink alcohol if your health care provider tells you not to drink.  If you drink alcohol: ? Limit how much you have to 0-2 drinks a day. ? Be aware of how much alcohol is in your drink. In the [place] one drink equals one 12 oz bottle of beer (355 mL), one 5 oz glass of wine (148 mL), or one 1 oz glass of hard liquor (44 mL). Lifestyle  Take daily care of your teeth and gums.  Stay active. Exercise for at least 30 minutes on 5 or more days each week.  Do not use any products that contain nicotine or tobacco, such as cigarettes, e-cigarettes, and chewing tobacco. If you need help quitting, ask your health care provider.  If you are sexually active, practice safe sex. Use a condom or other form of protection to prevent STIs  (sexually transmitted infections).  Talk with your health care provider about taking a low-dose aspirin or statin. What's next?  Visit your health care provider once a year for a well check visit.  Ask your health care provider how often you should have your eyes and teeth checked.  Stay up to date on all vaccines. This information is not intended to replace advice given to you by your health care provider. Make sure you discuss any questions you have with your health care provider. Document Revised: 05/09/2018 Document Reviewed: 05/09/2018 Elsevier Patient Education  2020 [employer].

## 2019-10-28 ENCOUNTER — Other Ambulatory Visit: Payer: Self-pay | Admitting: Gastroenterology

## 2019-11-03 ENCOUNTER — Encounter: Payer: Self-pay | Admitting: Medical

## 2019-11-03 ENCOUNTER — Other Ambulatory Visit: Payer: Self-pay

## 2019-11-03 ENCOUNTER — Ambulatory Visit (INDEPENDENT_AMBULATORY_CARE_PROVIDER_SITE_OTHER): Payer: PPO | Admitting: Internal Medicine

## 2019-11-03 ENCOUNTER — Encounter: Payer: Self-pay | Admitting: Internal Medicine

## 2019-11-03 VITALS — BP 134/75 | HR 83 | Temp 97.7°F | Resp 18 | Ht 68.0 in | Wt 152.0 lb

## 2019-11-03 DIAGNOSIS — L282 Other prurigo: Secondary | ICD-10-CM

## 2019-11-03 MED ORDER — PREDNISONE 10 MG PO TABS: ORAL_TABLET | ORAL | 0 refills | Status: DC

## 2019-11-03 MED ORDER — BETAMETHASONE DIPROPIONATE AUG 0.05 % EX CREA: TOPICAL_CREAM | Freq: Two times a day (BID) | CUTANEOUS | 0 refills | Status: DC

## 2019-11-03 NOTE — Patient Instructions (Signed)
Stop the antihistaminic you are taking, start taking Claritin 10 mg OTC 1 tablet daily until you are better  Take prednisone as prescribed  Use the cream applying sent twice a day until better  If you are not improving or if the symptoms resurface please let me know

## 2019-11-03 NOTE — Progress Notes (Signed)
Subjective:    Patient ID: Hunter Vega, male    DOB: 1950-06-08, 69 y.o.   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 He came back from [place] last week, and the current rash developed 10/31/2019: It is extremely pruritic, started on the left buttock and now has spread. Since he came back from the farm he has done some yard work at his own house.   Review of Systems Denies any fever chills No nausea, vomiting, diarrhea. No myalgias or arthralgias No fatigue or headache  Past Medical History:  Diagnosis Date  . Allergy    mild seasonal  . Constipation    started Linzess monday 08-12-2018   . History of seizure    reports no seizure since 2015   . Hx of exercise stress test    reports  had done to eval since coronary artery stent was placed ; reports test was "normal"   . Hyperlipidemia   . PSVT (paroxysmal supraventricular tachycardia) ([place]    reports no knowledge of this   . Seizures ([place])    last 2015  . Sleep apnea    reports no longer has sicne losing weight     Past Surgical History:  Procedure Laterality Date  . CARDIAC CATHETERIZATION    . COLONOSCOPY    . CORONARY ANGIOPLASTY    . CORONARY STENT PLACEMENT  2015   x2. Mid LAD  . INGUINAL HERNIA REPAIR Bilateral 09/26/2017   Procedure: LAPAROSCOPIC BILATERAL INGUINAL HERNIA REPAIR;  Surgeon: Michael Boston, MD;  Location: [facility];  Service: General;  Laterality: Bilateral;  . INSERTION OF MESH Bilateral 09/26/2017   Procedure: INSERTION OF MESH;  Surgeon: Michael Boston, MD;  Location: [facility];  Service: General;  Laterality: Bilateral;    Allergies as of 11/03/2019      Reactions   Penicillins Swelling   Arm swelling Has patient had a PCN reaction causing immediate rash, facial/tongue/throat swelling, SOB or  lightheadedness with hypotension: No Has patient had a PCN reaction causing severe rash involving mucus membranes or skin necrosis: No Has patient had a PCN reaction that required hospitalization: No Has patient had a PCN reaction occurring within the last 10 years: No If all of the above answers are "NO", then may proceed with Cephalosporin use.      Medication List       Accurate as of November 03, 2019 11:27 AM. If you have any questions, ask your nurse or doctor.        aspirin EC 81 MG tablet Take 81 mg by mouth daily.   atorvastatin 80 MG tablet Commonly known as: LIPITOR Take 1 tablet (80 mg total) by mouth at bedtime.   lamoTRIgine 150 MG tablet Commonly known as: LAMICTAL Take 1 tablet (150 mg total) by mouth 2 (two) times daily.   Linzess 290 MCG Caps capsule Generic drug: linaclotide TAKE 1 CAPSULE BY MOUTH ONCE DAILY BEFORE BREAKFAST          Objective:   Physical Exam Skin:        BP 134/75 (BP Location: Left Arm, Patient Position: Sitting, Cuff Size: Small)   Pulse 83   Temp 97.7 F (36.5 C) (Temporal)   Resp 18   Ht 5\' 8"  (1.727 m)   Wt  152 lb (68.9 kg)   SpO2 100%   BMI 23.11 kg/m  General:   Well developed, NAD, BMI noted. HEENT:  Normocephalic . Face symmetric, atraumatic  Skin:  See pictures showing representative rash. See graphic showing distribution. Neurologic:  alert & oriented X3.  Speech normal, gait appropriate for age and unassisted Psych--  Cognition and judgment appear intact.  Cooperative with normal attention span and concentration.  Behavior appropriate. No anxious or depressed appearing                Assessment     Patient is 69, history of CAD, PSVT, history of pneumonia 04/2018, history of seizures presents with:  Pruritic rash: As described above, in the context of recently working on a farm in [place] & history of previous tick bites. The patient suspect chiggers, that is definitely a possibility,  also bedbugs or some other atopic reaction. Rash is not typical for Lyme and he has no systemic symptoms. Plan: Stop diphenhydramine which is not working, start Claritin Prednisone Stop OTC hydrocortisone and use Diprolene He already washed all the clothing he brought from the farm. If not better he will let me know This visit occurred during the SARS-CoV-2 public health emergency.  Safety protocols were in place, including screening questions prior to the visit, additional usage of staff PPE, and extensive cleaning of exam room while observing appropriate contact time as indicated for disinfecting solutions.

## 2019-11-03 NOTE — Progress Notes (Signed)
Pre visit review using our clinic review tool, if applicable. No additional management support is needed unless otherwise documented below in the visit note. 

## 2019-12-30 ENCOUNTER — Telehealth: Payer: Self-pay | Admitting: Medical

## 2019-12-30 NOTE — Telephone Encounter (Signed)
Patient states that he would like to [facility] [employer] [place] to Kathlene November MD .  Please Advise

## 2019-12-30 NOTE — Telephone Encounter (Signed)
Ok with me 

## 2019-12-30 NOTE — Telephone Encounter (Signed)
Okay with me, will need a office visit to get established

## 2020-01-01 ENCOUNTER — Encounter: Payer: Self-pay | Admitting: Neurology

## 2020-01-01 ENCOUNTER — Ambulatory Visit: Payer: PPO | Admitting: Neurology

## 2020-01-01 VITALS — BP 142/70 | HR 69 | Ht 67.0 in | Wt 146.0 lb

## 2020-01-01 DIAGNOSIS — G40209 Localization-related (focal) (partial) symptomatic epilepsy and epileptic syndromes with complex partial seizures, not intractable, without status epilepticus: Secondary | ICD-10-CM | POA: Diagnosis not present

## 2020-01-01 DIAGNOSIS — H2513 Age-related nuclear cataract, bilateral: Secondary | ICD-10-CM | POA: Diagnosis not present

## 2020-01-01 DIAGNOSIS — H40003 Preglaucoma, unspecified, bilateral: Secondary | ICD-10-CM | POA: Diagnosis not present

## 2020-01-01 DIAGNOSIS — H16223 Keratoconjunctivitis sicca, not specified as Sjogren's, bilateral: Secondary | ICD-10-CM | POA: Diagnosis not present

## 2020-01-01 NOTE — Progress Notes (Signed)
Reason for visit: Seizures, resting tremor  Hunter Vega is an 69 y.o. male  History of present illness:  Hunter Vega is a 69 year old left-handed white male with a history of well-controlled seizures.  The patient is on Lamictal and he tolerates the medication well the patient is developed a resting tremor of the right upper extremity, he continues to have the tremor with possibly some mild progression.  The tremor is worse when he is nervous.  He has no problem using either upper extremity.  He has not noted any change in his walking.  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  The patient returns to the office today for further evaluation.  No other new symptoms have been noted such as speech changes, swallowing problems, balance issues, or changes in posture.  Past Medical History:  Diagnosis Date   Allergy    mild seasonal   Constipation    started Linzess monday 08-12-2018    History of seizure    reports no seizure since 2015    Hx of exercise stress test    reports  had done to eval since coronary artery stent was placed ; reports test was "normal"    Hyperlipidemia    PSVT (paroxysmal supraventricular tachycardia) ([place])    reports no knowledge of this    Seizures ([place])    last 2015   Sleep apnea    reports no longer has sicne losing weight     Past Surgical History:  Procedure Laterality Date   CARDIAC CATHETERIZATION     COLONOSCOPY     CORONARY ANGIOPLASTY     CORONARY STENT PLACEMENT  2015   x2. Mid LAD   INGUINAL HERNIA REPAIR Bilateral 09/26/2017   Procedure: LAPAROSCOPIC BILATERAL INGUINAL HERNIA REPAIR;  Surgeon: Michael Boston, MD;  [facility];  Service: General;  Laterality: Bilateral;   INSERTION OF MESH Bilateral 09/26/2017   Procedure: INSERTION OF MESH;  Surgeon: Michael Boston, MD;  [facility];  Service: General;  Laterality: Bilateral;    Family History  Problem Relation Age  of Onset   Liver disease Mother    Heart attack Father    Diabetes Paternal Uncle    Colon cancer Neg Hx    Esophageal cancer Neg Hx    Prostate cancer Neg Hx    Colon polyps Neg Hx    Rectal cancer Neg Hx    Stomach cancer Neg Hx     Social history:  reports that he has never smoked. He has never used smokeless tobacco. He reports that he does not drink alcohol and does not use drugs.    Allergies  Allergen Reactions   Penicillins Swelling    Arm swelling Has patient had a PCN reaction causing immediate rash, facial/tongue/throat swelling, SOB or lightheadedness with hypotension: No Has patient had a PCN reaction causing severe rash involving mucus membranes or skin necrosis: No Has patient had a PCN reaction that required hospitalization: No Has patient had a PCN reaction occurring within the last 10 years: No If all of the above answers are "NO", then may proceed with Cephalosporin use.     Medications:  Prior to Admission medications   Medication Sig Start Date End Date Taking? Authorizing Provider  aspirin EC 81 MG tablet Take 81 mg by mouth daily.    Yes [provider]  atorvastatin (LIPITOR) 80 MG tablet Take 1 tablet (80 mg total) by mouth at  bedtime. 01/30/19  Yes Richardo Priest, MD  lamoTRIgine (LAMICTAL) 150 MG tablet Take 1 tablet (150 mg total) by mouth 2 (two) times daily. 06/19/19  Yes Kathrynn Ducking, MD  LINZESS 290 MCG CAPS capsule TAKE 1 CAPSULE BY MOUTH ONCE DAILY BEFORE BREAKFAST 10/28/19  Yes Milus Banister, MD  augmented betamethasone dipropionate (DIPROLENE-AF) 0.05 % cream Apply topically 2 (two) times daily. Patient not taking: Reported on 01/01/2020 11/03/19   Colon Branch, MD    ROS:  Out of a complete 14 system review of symptoms, the patient complains only of the following symptoms, and all other reviewed systems are negative.  Tremor  Blood pressure (!) 142/70, pulse 69, height 5\' 7"  (1.702 m), weight 146 lb (66.2  kg).  Physical Exam  General: The patient is alert and cooperative at the time of the examination.  Skin: No significant peripheral edema is noted.   Neurologic Exam  Mental status: The patient is alert and oriented x 3 at the time of the examination. The patient has apparent normal recent and remote memory, with an apparently normal attention span and concentration ability.   Cranial nerves: Facial symmetry is present. Speech is normal, no aphasia or dysarthria is noted. Extraocular movements are full. Visual fields are full.  Mild masking of the face is seen.  Motor: The patient has good strength in all 4 extremities.  Sensory examination: Soft touch sensation is symmetric on the face, arms, and legs.  Coordination: The patient has good finger-nose-finger and heel-to-shin bilaterally.  A resting tremor involving the right upper extremity is noted.  Gait and station: The patient has a normal gait.  The patient has good bilateral arm swing, but tremor seen in the right arm with walking.  Tandem gait is normal. Romberg is negative. No drift is seen.  Reflexes: Deep tendon reflexes are symmetric.   MRI brain 07/14/19:  IMPRESSION: This MRI of the brain without contrast shows the following: 1. Scattered T2/FLAIR hyperintense foci in the subcortical and deep white matter. None of these appear to be acute and most were present on the MRI from 2014. This is most consistent with mild chronic microvascular ischemic change. Demyelination or vasculitis is less likely. 2. Brain volume is normal for age. 3. There are no acute findings.  * MRI scan images were reviewed online. I agree with the written report.    Assessment/Plan:  1.  History of seizures, well controlled  2.  Right upper extremity tremor, probable Parkinson's disease  The patient does appear to have some mild masking of the face, but no other signs of parkinsonism other than the resting tremor.  The patient does  report troubles with constipation and a history of restless leg syndrome.  He denies any changes in sensation of smell.  The patient will be followed conservatively for the tremor, he will follow-up in 6 months.  Jill Alexanders MD 01/01/2020 9:11 AM  [facility] [address] [place], [place] [address]  Phone [phone] Fax [phone]

## 2020-01-06 ENCOUNTER — Encounter: Payer: Self-pay | Admitting: Internal Medicine

## 2020-01-07 ENCOUNTER — Other Ambulatory Visit: Payer: Self-pay | Admitting: Cardiology

## 2020-01-07 ENCOUNTER — Encounter: Payer: PPO | Admitting: Internal Medicine

## 2020-01-07 MED ORDER — ATORVASTATIN CALCIUM 80 MG PO TABS: 80.0000 mg | ORAL_TABLET | Freq: Every day | ORAL | 3 refills | Status: DC

## 2020-01-07 NOTE — Telephone Encounter (Signed)
*  STAT* If patient is at the pharmacy, call can be transferred to refill team.   1. Which medications need to be refilled? (please list name of each medication and dose if known)  atorvastatin (LIPITOR) 80 MG tablet  2. Which pharmacy/location (including street and city if local pharmacy) is medication to be sent to? [facility] - [place], [place] - [address]   3. Do they need a 30 day or 90 day supply? 90 with refills   Patient said he normally gets a 90 day fill but this last time he only got a 30 day fill. He has an appointment scheduled to see Dr. Bettina Gavia 04/07/20 and will not have enough medication to last him until his appointment

## 2020-01-07 NOTE — Telephone Encounter (Signed)
Refill sent in per request.  

## 2020-01-14 ENCOUNTER — Encounter: Payer: PPO | Admitting: Internal Medicine

## 2020-02-10 ENCOUNTER — Telehealth: Payer: Self-pay | Admitting: Gastroenterology

## 2020-02-10 NOTE — Telephone Encounter (Signed)
The pt has ongoing constipation that he has been able to manage until recently.  He has begun to have nausea and no BM for 4-5 days at a time.  Very uncomfortable. Last seen 01/2019 for same.  Taking Linzess  290 mcg daily and doing a bowel purge about every week.  Appt made to see Colleen on 9/16.

## 2020-02-11 DIAGNOSIS — D1801 Hemangioma of skin and subcutaneous tissue: Secondary | ICD-10-CM | POA: Diagnosis not present

## 2020-02-11 DIAGNOSIS — C44519 Basal cell carcinoma of skin of other part of trunk: Secondary | ICD-10-CM | POA: Diagnosis not present

## 2020-02-11 DIAGNOSIS — L821 Other seborrheic keratosis: Secondary | ICD-10-CM | POA: Diagnosis not present

## 2020-02-11 DIAGNOSIS — D2272 Melanocytic nevi of left lower limb, including hip: Secondary | ICD-10-CM | POA: Diagnosis not present

## 2020-02-11 DIAGNOSIS — Z85828 Personal history of other malignant neoplasm of skin: Secondary | ICD-10-CM | POA: Diagnosis not present

## 2020-02-11 DIAGNOSIS — D225 Melanocytic nevi of trunk: Secondary | ICD-10-CM | POA: Diagnosis not present

## 2020-02-11 DIAGNOSIS — L814 Other melanin hyperpigmentation: Secondary | ICD-10-CM | POA: Diagnosis not present

## 2020-02-11 DIAGNOSIS — L570 Actinic keratosis: Secondary | ICD-10-CM | POA: Diagnosis not present

## 2020-02-12 ENCOUNTER — Encounter: Payer: Self-pay | Admitting: Internal Medicine

## 2020-02-12 ENCOUNTER — Ambulatory Visit (INDEPENDENT_AMBULATORY_CARE_PROVIDER_SITE_OTHER): Payer: PPO | Admitting: Internal Medicine

## 2020-02-12 ENCOUNTER — Other Ambulatory Visit: Payer: Self-pay

## 2020-02-12 ENCOUNTER — Telehealth: Payer: Self-pay | Admitting: Nurse Practitioner

## 2020-02-12 ENCOUNTER — Ambulatory Visit: Payer: PPO | Admitting: Nurse Practitioner

## 2020-02-12 ENCOUNTER — Encounter: Payer: Self-pay | Admitting: Nurse Practitioner

## 2020-02-12 VITALS — BP 133/78 | HR 69 | Temp 97.6°F | Resp 18 | Ht 67.0 in | Wt 145.4 lb

## 2020-02-12 VITALS — BP 120/74 | HR 82 | Ht 67.0 in | Wt 149.1 lb

## 2020-02-12 DIAGNOSIS — K5900 Constipation, unspecified: Secondary | ICD-10-CM

## 2020-02-12 DIAGNOSIS — I25118 Atherosclerotic heart disease of native coronary artery with other forms of angina pectoris: Secondary | ICD-10-CM

## 2020-02-12 DIAGNOSIS — E782 Mixed hyperlipidemia: Secondary | ICD-10-CM

## 2020-02-12 NOTE — Patient Instructions (Signed)
If you are age 71 or older, your body mass index should be between 23-30. Your Body mass index is 23.36 kg/m. If this is out of the aforementioned range listed, please consider follow up with your Primary Care Provider.  If you are age 37 or younger, your body mass index should be between 19-25. Your Body mass index is 23.36 kg/m. If this is out of the aformentioned range listed, please consider follow up with your Primary Care Provider.   1.Take Miralax 1 capful mixed in 8 ounces of water twice daily. 2. Continue taking Linzess 273mcg 1 tablet in the am 3. Take 1-3 ducolax tablets every third night. 4. Call the office if your symptoms worsen. 5. Follow up with Hunter Vega in 3-4 months. Please call the office in the December 2021 to schedule. [phone]  Due to recent changes in healthcare laws, you may see the results of your imaging and laboratory studies on MyChart before your provider has had a chance to review them.  We understand that in some cases there may be results that are confusing or concerning to you. Not all laboratory results come back in the same time frame and the provider may be waiting for multiple results in order to interpret others.  Please give [place] 48 hours in order for your provider to thoroughly review all the results before contacting the office for clarification of your results.

## 2020-02-12 NOTE — Progress Notes (Signed)
Subjective:    Patient ID: Hunter Vega, male    DOB: [birth year], 69 y.o.   MRN: [id number]  DOS:  02/12/2020 Type of visit - description: Transfer of care, new patient to me Patient is transferring his care to my practice. His only concern today is constipation, this is going on for more than a year. He has changed his diet, is using Linzess which helps on and off. Occasionally he gets constipated for 6 days    Review of Systems Denies nausea, vomiting or blood in the stools. Not taking calcium supplements Occasional stomach discomfort after a bowel movement . No chest pain, difficulty breathing.  No edema or palpitations  Past Medical History:  Diagnosis Date  . Allergy    mild seasonal  . Constipation    started Linzess monday 08-12-2018   . History of seizure    reports no seizure since 2015   . Hx of exercise stress test    reports  had done to eval since coronary artery stent was placed ; reports test was "normal"   . Hyperlipidemia   . PSVT (paroxysmal supraventricular tachycardia) ([place])    reports no knowledge of this   . Seizures ([place])    last 2015  . Sleep apnea    reports no longer has sicne losing weight     Past Surgical History:  Procedure Laterality Date  . CARDIAC CATHETERIZATION    . COLONOSCOPY    . CORONARY ANGIOPLASTY    . CORONARY STENT PLACEMENT  2015   x2. Mid LAD  . INGUINAL HERNIA REPAIR Bilateral 09/26/2017   Procedure: LAPAROSCOPIC BILATERAL INGUINAL HERNIA REPAIR;  Surgeon: Michael Boston, MD;  Location[facility];  Service: General;  Laterality: Bilateral;  . INSERTION OF MESH Bilateral 09/26/2017   Procedure: INSERTION OF MESH;  Surgeon: Michael Boston, MD;  Location[facility];  Service: General;  Laterality: Bilateral;    Allergies as of 02/12/2020      Reactions   Penicillins Swelling   Arm swelling Has patient had a PCN reaction causing immediate rash, facial/tongue/throat swelling, SOB or lightheadedness with hypotension: No Has  patient had a PCN reaction causing severe rash involving mucus membranes or skin necrosis: No Has patient had a PCN reaction that required hospitalization: No Has patient had a PCN reaction occurring within the last 10 years: No If all of the above answers are "NO", then may proceed with Cephalosporin use.      Medication List       Accurate as of February 12, 2020 11:59 PM. If you have any questions, ask your nurse or doctor.        STOP taking these medications   augmented betamethasone dipropionate 0.05 % cream Commonly known as: DIPROLENE-AF Stopped by: Kathlene November, MD     TAKE these medications   aspirin EC 81 MG tablet Take 81 mg by mouth daily.   atorvastatin 80 MG tablet Commonly known as: LIPITOR Take 1 tablet (80 mg total) by mouth at bedtime.   lamoTRIgine 150 MG tablet Commonly known as: LAMICTAL Take 1 tablet (150 mg total) by mouth 2 (two) times daily.   Linzess 290 MCG Caps capsule Generic drug: linaclotide TAKE 1 CAPSULE BY MOUTH ONCE DAILY BEFORE BREAKFAST          Objective:   Physical Exam BP 133/78 (BP Location: Left Arm, Patient Position: Sitting, Cuff Size: Small)   Pulse 69   Temp 97.6 F (36.4 C) (Oral)   Resp 18  Ht 5\' 7"  (1.702 m)   Wt 145 lb 6 oz (65.9 kg)   SpO2 100%   BMI 22.77 kg/m  General:   Well developed, NAD, BMI noted.  HEENT:  Normocephalic . Face symmetric, atraumatic Lungs:  CTA B Normal respiratory effort, no intercostal retractions, no accessory muscle use. Heart: RRR,  no murmur.  Abdomen:  Not distended, soft, non-tender. No rebound or rigidity.   Skin: Not pale. Not jaundice Lower extremities: no pretibial edema bilaterally  Neurologic:  alert & oriented X3.  Speech normal, gait appropriate for age and unassisted Psych--  Cognition and judgment appear intact.  Cooperative with normal attention span and concentration.  Behavior appropriate. No anxious or depressed appearing.     Assessment     Assessment CAD PCI  stent LAD 2014   PSVT H/o [facility] 2019 Neurology (Dr. Jannifer Franklin) -H/o Sz -Possible Parkinson's disease Chronic constipation (GI Dr [name])  PLAN CAD: asx, on aspirin and Lipitor, check a CMP. High cholesterol: Check a lipid panel, on atorvastatin, not fasting today. Seizures: Follow-up by neurology, right arm tremor recently noted, possibly Parkinson? Chronic constipation: I advised in general terms:  good hydration, high-fiber diet, possibly Metamucil with lots of fluids, continue Linzess. Will see GI today. Will check a CBC and TSH. Preventive care: Due for a Td, PNM 23 and flu shot however he is having a covid booster vaccine @ our  Pharmacy ,thus recommend 2 weeks wait time after the booster.  RTC 3 to 4 months CPX   This visit occurred during the SARS-CoV-2 public health emergency.  Safety protocols were in place, including screening questions prior to the visit, additional usage of staff PPE, and extensive cleaning of exam room while observing appropriate contact time as indicated for disinfecting solutions.

## 2020-02-12 NOTE — Patient Instructions (Addendum)
Get flu shot next month  GO TO THE LAB : Get the blood work     [place] back for a physical exam in 3 to 4 months

## 2020-02-12 NOTE — Progress Notes (Signed)
02/12/2020 ATREYU MAK [id number] 1951-01-05   Chief Complaint: Constipation  History of Present Illness: Hunter Vega is a 69 year old male with a past medical history of coronary artery disease status post stent, PSVT, seizure disorder, hyperlipidemia, colon polyps and chronic constipation.  He is followed by Dr. [name].  He presents today for further evaluation regarding chronic constipation.  He is taking Linzess 290 mcg once daily.  He goes through cycles when he does not pass a bowel movement for 6 days.  He then ttakes 3 Dulcolax  tablets and a MiraLAX purge for the next 3 hours which typically results in passing a large amount of soft to loose stool.  He takes his bowel purge once every 1 to 2 weeks.  Amitiza was used in the past but lost its effectiveness.  He often has abdominal bloat and does not feel the urge to pass a BM.  He is quite frustrated regarding his chronic constipation.  He developed a right hand tremor about 6 months ago and there is possible concern he might have developing Parkinson's disease.  He denies having any rectal bleeding or melena.  His most recent colonoscopy was 08/16/2018,  One  (29mm)  tubular adenomatous polyp was removed from the ascending colon.  He was advised to repeat a colonoscopy in 7 years.  He is lost 3 pounds.  Today's weight is 149 pounds.  He underwent an abdominal/pelvic CT 07/11/2018 which showed a subcentimeter liver hypodensity too small to characterize but likely a cyst, no acute findings in the abdomen or pelvis were identified.  No focal mass or adenopathy.  Aortic atherosclerosis was noted.  Labs 03/28/2019 showed a mild anemia with a hemoglobin 12.9.  MCV 96.7.  No other complaints today.  Current Outpatient Medications on File Prior to Visit  Medication Sig Dispense Refill  . aspirin EC 81 MG tablet Take 81 mg by mouth daily.     Marland Kitchen atorvastatin (LIPITOR) 80 MG tablet Take 1 tablet (80 mg total) by mouth at bedtime. 90 tablet 3    . lamoTRIgine (LAMICTAL) 150 MG tablet Take 1 tablet (150 mg total) by mouth 2 (two) times daily. 180 tablet 3  . LINZESS 290 MCG CAPS capsule TAKE 1 CAPSULE BY MOUTH ONCE DAILY BEFORE BREAKFAST 90 capsule 0   No current facility-administered medications on file prior to visit.    Allergies  Allergen Reactions  . Penicillins Swelling    Arm swelling Has patient had a PCN reaction causing immediate rash, facial/tongue/throat swelling, SOB or lightheadedness with hypotension: No Has patient had a PCN reaction causing severe rash involving mucus membranes or skin necrosis: No Has patient had a PCN reaction that required hospitalization: No Has patient had a PCN reaction occurring within the last 10 years: No If all of the above answers are "NO", then may proceed with Cephalosporin use.      Current Medications, Allergies, Past Medical History, Past Surgical History, Family History and Social History were reviewed in [employer] record.   Review of Systems:   Constitutional: 2 to 3 lbs weight loss.  Respiratory: Negative for shortness of breath.   Cardiovascular: Negative for chest pain, palpitations and leg swelling.  Gastrointestinal: See HPI.  Musculoskeletal: Negative for back pain or muscle aches.  Neurological: Negative for dizziness, headaches or paresthesias.    Physical Exam: BP 120/74   Pulse 82   Ht 5\' 7"  (1.702 m)   Wt 149 lb 2 oz (  67.6 kg)   BMI 23.36 kg/m  General: Well developed 69 year old male in no acute distress. Head: Normocephalic and atraumatic. Eyes: No scleral icterus. Conjunctiva pink . Ears: Normal auditory acuity. Mouth: Dentition intact. No ulcers or lesions.  Lungs: Clear throughout to auscultation. Heart: Regular rate and rhythm, no murmur. Abdomen: Soft, nontender and nondistended. No masses or hepatomegaly. Normal bowel sounds x 4 quadrants.  Rectal: Deferred. Musculoskeletal: Symmetrical with no gross  deformities. Extremities: No edema. Neurological: Alert oriented x 4. No focal deficits.  Psychological: Alert and cooperative. Normal mood and affect  Assessment and Recommendations:  33.  69 year old male with chronic constipation and bloat.  -Continue Linzess 290 mcg once daily.  MiraLAX twice daily.  Dulcolax 1-3 tabs every third night as needed.  -Patient to send me an update via MyChart in 2 weeks.  He will call our office if his symptoms worsen. -Follow-up with Dr. [name] in 3 to 4 months  2.  History of a tubular adenomatous colon polyp identified at the time of a colonoscopy which was removed.  Next colonoscopy due 07/2025.  3.  Labs 02/2019 show a normocytic anemia -Check CBC and CMP today -If his laboratory studies indicate anemia further labs including iron panel/B12/folate and endoscopic evaluation may be warranted -Further recommendations to be determined after the above lab results received  4.  Right hand tremor, questionable developing Parkinson's disease

## 2020-02-12 NOTE — Progress Notes (Signed)
Pre visit review using our clinic review tool, if applicable. No additional management support is needed unless otherwise documented below in the visit note. 

## 2020-02-13 ENCOUNTER — Telehealth: Payer: Self-pay | Admitting: Internal Medicine

## 2020-02-13 LAB — CBC WITH DIFFERENTIAL/PLATELET
Absolute Monocytes: 667 cells/uL (ref 200–950)
Basophils Absolute: 68 cells/uL (ref 0–200)
Basophils Relative: 1.2 %
Eosinophils Absolute: 365 cells/uL (ref 15–500)
Eosinophils Relative: 6.4 %
HCT: 39.2 % (ref 38.5–50.0)
Hemoglobin: 12.8 g/dL — ABNORMAL LOW (ref 13.2–17.1)
Lymphs Abs: 1254 cells/uL (ref 850–3900)
MCH: 31.6 pg (ref 27.0–33.0)
MCHC: 32.7 g/dL (ref 32.0–36.0)
MCV: 96.8 fL (ref 80.0–100.0)
MPV: 12.7 fL — ABNORMAL HIGH (ref 7.5–12.5)
Monocytes Relative: 11.7 %
Neutro Abs: 3346 cells/uL (ref 1500–7800)
Neutrophils Relative %: 58.7 %
Platelets: 167 10*3/uL (ref 140–400)
RBC: 4.05 10*6/uL — ABNORMAL LOW (ref 4.20–5.80)
RDW: 11.8 % (ref 11.0–15.0)
Total Lymphocyte: 22 %
WBC: 5.7 10*3/uL (ref 3.8–10.8)

## 2020-02-13 LAB — COMPREHENSIVE METABOLIC PANEL
AG Ratio: 1.9 (calc) (ref 1.0–2.5)
ALT: 10 U/L (ref 9–46)
AST: 15 U/L (ref 10–35)
Albumin: 4.3 g/dL (ref 3.6–5.1)
Alkaline phosphatase (APISO): 66 U/L (ref 35–144)
BUN: 17 mg/dL (ref 7–25)
CO2: 27 mmol/L (ref 20–32)
Calcium: 9.6 mg/dL (ref 8.6–10.3)
Chloride: 104 mmol/L (ref 98–110)
Creat: 1.06 mg/dL (ref 0.70–1.18)
Globulin: 2.3 g/dL (calc) (ref 1.9–3.7)
Glucose, Bld: 112 mg/dL — ABNORMAL HIGH (ref 65–99)
Potassium: 4.6 mmol/L (ref 3.5–5.3)
Sodium: 141 mmol/L (ref 135–146)
Total Bilirubin: 0.7 mg/dL (ref 0.2–1.2)
Total Protein: 6.6 g/dL (ref 6.1–8.1)

## 2020-02-13 LAB — LIPID PANEL
Cholesterol: 141 mg/dL (ref <200)
HDL: 58 mg/dL (ref >=40)
LDL Cholesterol (Calc): 66 mg/dL (calc)
Non-HDL Cholesterol (Calc): 83 mg/dL (calc) (ref <130)
Total CHOL/HDL Ratio: 2.4 (calc) (ref <5.0)
Triglycerides: 91 mg/dL (ref <150)

## 2020-02-13 LAB — TSH: TSH: 2.54 mIU/L (ref 0.40–4.50)

## 2020-02-13 NOTE — Telephone Encounter (Signed)
I spoke with the patient. He is trying to remember the conversation during the visit talking about "things that help me with constipation, some kind of lemon oil or something." He is aware of the recommendations for the Linzess, Miralax and Dulcolax tablets.

## 2020-02-13 NOTE — Progress Notes (Signed)
I agree with the above note, plan 

## 2020-02-13 NOTE — Telephone Encounter (Signed)
Pt's spouse dropped off copy of Power of Attorney (7 pages) for provider to see and have on pt's chart. Document put at front office tray under providers name.

## 2020-02-13 NOTE — Telephone Encounter (Signed)
Placed in PCP red folder

## 2020-02-15 DIAGNOSIS — Z09 Encounter for follow-up examination after completed treatment for conditions other than malignant neoplasm: Secondary | ICD-10-CM

## 2020-02-15 HISTORY — DX: Encounter for follow-up examination after completed treatment for conditions other than malignant neoplasm: Z09

## 2020-02-15 NOTE — Telephone Encounter (Signed)
Beth, Ibgard (peppermint oil) one bid can possibly reduce constipation symptoms, he can try that if needed, purchase otc. Thx.

## 2020-02-15 NOTE — Assessment & Plan Note (Signed)
CAD: asx, on aspirin and Lipitor, check a CMP. High cholesterol: Check a lipid panel, on atorvastatin, not fasting today. Seizures: Follow-up by neurology, right arm tremor recently noted, possibly Parkinson? Chronic constipation: I advised in general terms:  good hydration, high-fiber diet, possibly Metamucil with lots of fluids, continue Linzess. Will see GI today. Will check a CBC and TSH. Preventive care: Due for a Td, PNM 23 and flu shot however he is having a covid booster vaccine @ our  Pharmacy ,thus recommend 2 weeks wait time after the booster.  RTC 3 to 4 months CPX

## 2020-02-16 ENCOUNTER — Encounter: Payer: Self-pay | Admitting: Internal Medicine

## 2020-02-16 NOTE — Telephone Encounter (Signed)
Patient is advised.  

## 2020-02-16 NOTE — Telephone Encounter (Signed)
Document sent to be scanned.

## 2020-02-20 ENCOUNTER — Ambulatory Visit: Payer: PPO | Attending: Internal Medicine

## 2020-02-20 DIAGNOSIS — Z23 Encounter for immunization: Secondary | ICD-10-CM

## 2020-02-20 NOTE — Progress Notes (Signed)
   Covid-19 Vaccination Clinic  Name:  Hunter Vega    MRN: [id number] DOB: Apr 05, 1951  02/20/2020  [name] was observed post Covid-19 immunization for 15 minutes without incident. He was provided with Vaccine Information Sheet and instruction to access the V-Safe system. Vaccinated by [employer].   [name] was instructed to call 911 with any severe reactions post vaccine: Marland Kitchen Difficulty breathing  . Swelling of face and throat  . A fast heartbeat  . A bad rash all over body  . Dizziness and weakness

## 2020-02-23 DIAGNOSIS — C4491 Basal cell carcinoma of skin, unspecified: Secondary | ICD-10-CM | POA: Insufficient documentation

## 2020-02-25 ENCOUNTER — Ambulatory Visit: Payer: PPO | Admitting: Cardiology

## 2020-03-24 ENCOUNTER — Encounter: Payer: Self-pay | Admitting: Internal Medicine

## 2020-03-24 ENCOUNTER — Other Ambulatory Visit: Payer: Self-pay | Admitting: Gastroenterology

## 2020-03-30 ENCOUNTER — Encounter: Payer: PPO | Admitting: Internal Medicine

## 2020-04-05 DIAGNOSIS — R569 Unspecified convulsions: Secondary | ICD-10-CM | POA: Insufficient documentation

## 2020-04-05 DIAGNOSIS — Z87898 Personal history of other specified conditions: Secondary | ICD-10-CM | POA: Insufficient documentation

## 2020-04-05 DIAGNOSIS — T7840XA Allergy, unspecified, initial encounter: Secondary | ICD-10-CM | POA: Insufficient documentation

## 2020-04-05 DIAGNOSIS — G4730 Sleep apnea, unspecified: Secondary | ICD-10-CM | POA: Insufficient documentation

## 2020-04-05 DIAGNOSIS — E785 Hyperlipidemia, unspecified: Secondary | ICD-10-CM | POA: Insufficient documentation

## 2020-04-05 DIAGNOSIS — K5900 Constipation, unspecified: Secondary | ICD-10-CM | POA: Insufficient documentation

## 2020-04-05 DIAGNOSIS — Z9289 Personal history of other medical treatment: Secondary | ICD-10-CM | POA: Insufficient documentation

## 2020-04-07 ENCOUNTER — Ambulatory Visit: Payer: PPO | Admitting: Cardiology

## 2020-04-07 ENCOUNTER — Other Ambulatory Visit: Payer: Self-pay

## 2020-04-07 ENCOUNTER — Encounter: Payer: Self-pay | Admitting: Cardiology

## 2020-04-07 VITALS — BP 132/80 | HR 70 | Ht 67.0 in | Wt 147.1 lb

## 2020-04-07 DIAGNOSIS — I471 Supraventricular tachycardia: Secondary | ICD-10-CM | POA: Diagnosis not present

## 2020-04-07 DIAGNOSIS — E782 Mixed hyperlipidemia: Secondary | ICD-10-CM

## 2020-04-07 DIAGNOSIS — I25118 Atherosclerotic heart disease of native coronary artery with other forms of angina pectoris: Secondary | ICD-10-CM

## 2020-04-07 DIAGNOSIS — R251 Tremor, unspecified: Secondary | ICD-10-CM

## 2020-04-07 NOTE — Patient Instructions (Signed)

## 2020-04-07 NOTE — Progress Notes (Signed)
Cardiology Office Note:    Date:  04/07/2020   ID:  [name], [name] 10/09/1950, MRN [id number]  PCP:  Colon Branch, MD  Cardiologist:  Shirlee More, MD    Referring MD: [name], PA-C    ASSESSMENT:    1. Coronary artery disease of native artery of native heart with stable angina pectoris ([place])   2. Mixed hyperlipidemia   3. Tremor   4. PSVT (paroxysmal supraventricular tachycardia) (H[facility])    PLAN:    In order of problems listed above:  1. In general he is done well stable CAD no anginal discomfort continue aspirin statin New York Heart Association class I at this time I do not think he requires an ischemia evaluation. 2. Stable continue with statin his lipids are at target 3. No recurrence of SVT does not require suppressive treatment. 4. At this time no evidence of Parkinson's disease and I have offered him reassurance and encouraged him to follow-up with neurology over time   Next appointment: 1 year   Medication Adjustments/Labs and Tests Ordered: Current medicines are reviewed at length with the patient today.  Concerns regarding medicines are outlined above.  Orders Placed This Encounter  Procedures  . EKG 12-Lead   No orders of the defined types were placed in this encounter.   No chief complaint on file.   History of Present Illness:    Hunter Vega is a 69 y.o. male with a hx of CAD with PCI and stent to left anterior sending coronary artery in 2014, hyperlipidemia and SVT last seen 01/30/2019.  1 year ago his lipid profile was favorable with an LDL of 67. Compliance with diet, lifestyle and medications: Yes  He has done well remains active no angina dyspnea palpitation or syncope.  He has developed a mild tremor is concerned about Parkinson's disease is seeing a neurologist and remains a little bit unsettled whether or not he has a problem. Past Medical History:  Diagnosis Date  . Allergy    mild seasonal  . BCC (basal cell carcinoma of  skin)   . Bilateral inguinal hernia s/p lap repair with mesh 09/26/2017 09/26/2017  . CAD (coronary artery disease) 11/04/2015  . Constipation    started Linzess monday 08-12-2018   . Encounter for long-term (current) use of medications 12/18/2013  . History of seizure    reports no seizure since 2015   . Hx of exercise stress test    reports  had done to eval since coronary artery stent was placed ; reports test was "normal"   . Hyperlipidemia   . Left leg numbness 11/22/2016  . Mixed hyperlipidemia 11/24/2009   Overview:  IMPRESSION: Check labs today.  Continue Pravachol 40 mg daily.  . Mixed sleep apnea 03/30/2011  . Partial epilepsy with impairment of consciousness ([place]) 12/18/2013  . PCP NOTES >>>>>>> 02/15/2020  . Polypharmacy 12/18/2013  . PSVT (paroxysmal supraventricular tachycardia) ([place])    reports no knowledge of this   . Seizure disorder ([place]) 05/10/2011  . Seizures ([place])    last 2015  . Sleep apnea    reports no longer has sicne losing weight     Past Surgical History:  Procedure Laterality Date  . CARDIAC CATHETERIZATION    . COLONOSCOPY    . CORONARY ANGIOPLASTY    . CORONARY STENT PLACEMENT  2015   x2. Mid LAD  . INGUINAL HERNIA REPAIR Bilateral 09/26/2017   Procedure: LAPAROSCOPIC BILATERAL INGUINAL HERNIA REPAIR;  Surgeon: Michael Boston,  MD;  Location: [facility];  Service: General;  Laterality: Bilateral;  . INSERTION OF MESH Bilateral 09/26/2017   Procedure: INSERTION OF MESH;  Surgeon: Michael Boston, MD;  Location: [facility];  Service: General;  Laterality: Bilateral;    Current Medications: Current Meds  Medication Sig  . aspirin EC 81 MG tablet Take 81 mg by mouth daily.   Marland Kitchen atorvastatin (LIPITOR) 80 MG tablet Take 1 tablet (80 mg total) by mouth at bedtime.  . lamoTRIgine (LAMICTAL) 150 MG tablet Take 1 tablet (150 mg total) by mouth 2 (two) times daily.  Marland Kitchen LINZESS 290 MCG CAPS capsule TAKE 1 CAPSULE BY MOUTH ONCE DAILY BEFORE BREAKFAST     Allergies:   Penicillins    Social History   Socioeconomic History  . Marital status: Married    Spouse name: Not on file  . Number of children: 3  . Years of education: Not on file  . Highest education level: Not on file  Occupational History  . Not on file  Tobacco Use  . Smoking status: Never Smoker  . Smokeless tobacco: Never Used  Vaping Use  . Vaping Use: Never used  Substance and Sexual Activity  . Alcohol use: No  . Drug use: No  . Sexual activity: Yes  Other Topics Concern  . Not on file  Social History Narrative   Lives with wife   Caffeine use: 1 cup corfee daily   Left handed    Social Determinants of Health   Financial Resource Strain: Low Risk   . Difficulty of Paying Living Expenses: Not hard at all  Food Insecurity: No Food Insecurity  . Worried About Charity fundraiser in the Last Year: Never true  . Ran Out of Food in the Last Year: Never true  Transportation Needs: No Transportation Needs  . Lack of Transportation (Medical): No  . Lack of Transportation (Non-Medical): No  Physical Activity:   . Days of Exercise per Week: Not on file  . Minutes of Exercise per Session: Not on file  Stress:   . Feeling of Stress : Not on file  Social Connections:   . Frequency of Communication with Friends and Family: Not on file  . Frequency of Social Gatherings with Friends and Family: Not on file  . Attends Religious Services: Not on file  . Active Member of Clubs or Organizations: Not on file  . Attends Archivist Meetings: Not on file  . Marital Status: Not on file     Family History: The patient's family history includes Diabetes in his paternal uncle; Heart attack in his father; Liver disease in his mother. There is no history of Colon cancer, Esophageal cancer, Prostate cancer, Colon polyps, Rectal cancer, or Stomach cancer. ROS:   Please see the history of present illness.    All other systems reviewed and are negative.  EKGs/Labs/Other Studies Reviewed:    The  following studies were reviewed today:  EKG:  EKG ordered today and personally reviewed.  The ekg ordered today demonstrates sinus rhythm normal EKG  Recent Labs: 02/12/2020: ALT 10; BUN 17; Creat 1.06; Hemoglobin 12.8; Platelets 167; Potassium 4.6; Sodium 141; TSH 2.54  Recent Lipid Panel    Component Value Date/Time   CHOL 141 02/12/2020 0958   CHOL 151 01/30/2019 0919   TRIG 91 02/12/2020 0958   HDL 58 02/12/2020 0958   HDL 67 01/30/2019 0919   CHOLHDL 2.4 02/12/2020 0958   VLDL 15.4 03/28/2019 1018   LDLCALC  66 02/12/2020 0958    Physical Exam:    VS:  BP 132/80 (BP Location: Right Arm, Patient Position: Sitting, Cuff Size: Normal)   Pulse 70   Ht 5\' 7"  (1.702 m)   Wt 147 lb 1.6 oz (66.7 kg)   SpO2 98%   BMI 23.04 kg/m     Wt Readings from Last 3 Encounters:  04/07/20 147 lb 1.6 oz (66.7 kg)  02/12/20 149 lb 2 oz (67.6 kg)  02/12/20 145 lb 6 oz (65.9 kg)     GEN:  Well nourished, well developed in no acute distress he has no tremor HEENT: Normal NECK: No JVD; No carotid bruits LYMPHATICS: No lymphadenopathy CARDIAC: RRR, no murmurs, rubs, gallops RESPIRATORY:  Clear to auscultation without rales, wheezing or rhonchi  ABDOMEN: Soft, non-tender, non-distended MUSCULOSKELETAL:  No edema; No deformity  SKIN: Warm and dry NEUROLOGIC:  Alert and oriented x 3 PSYCHIATRIC:  Normal affect    Signed, Shirlee More, MD  04/07/2020 12:17 PM    [facility]

## 2020-04-27 ENCOUNTER — Telehealth: Payer: Self-pay

## 2020-04-27 NOTE — Telephone Encounter (Signed)
PA started using Cover My Meds for Trulance 3 mg tablets  Elixir at [phone].  (Key: [id number])

## 2020-05-09 IMAGING — CR DG CHEST 2V
2 series · 2 of 2 positions shown · non-contrast
Comparison: Chest x-ray dated March 26, 2018.

CLINICAL DATA: Cough and wheezing for the past 3 weeks.

EXAM:
CHEST - 2 VIEW

[w chest pa]
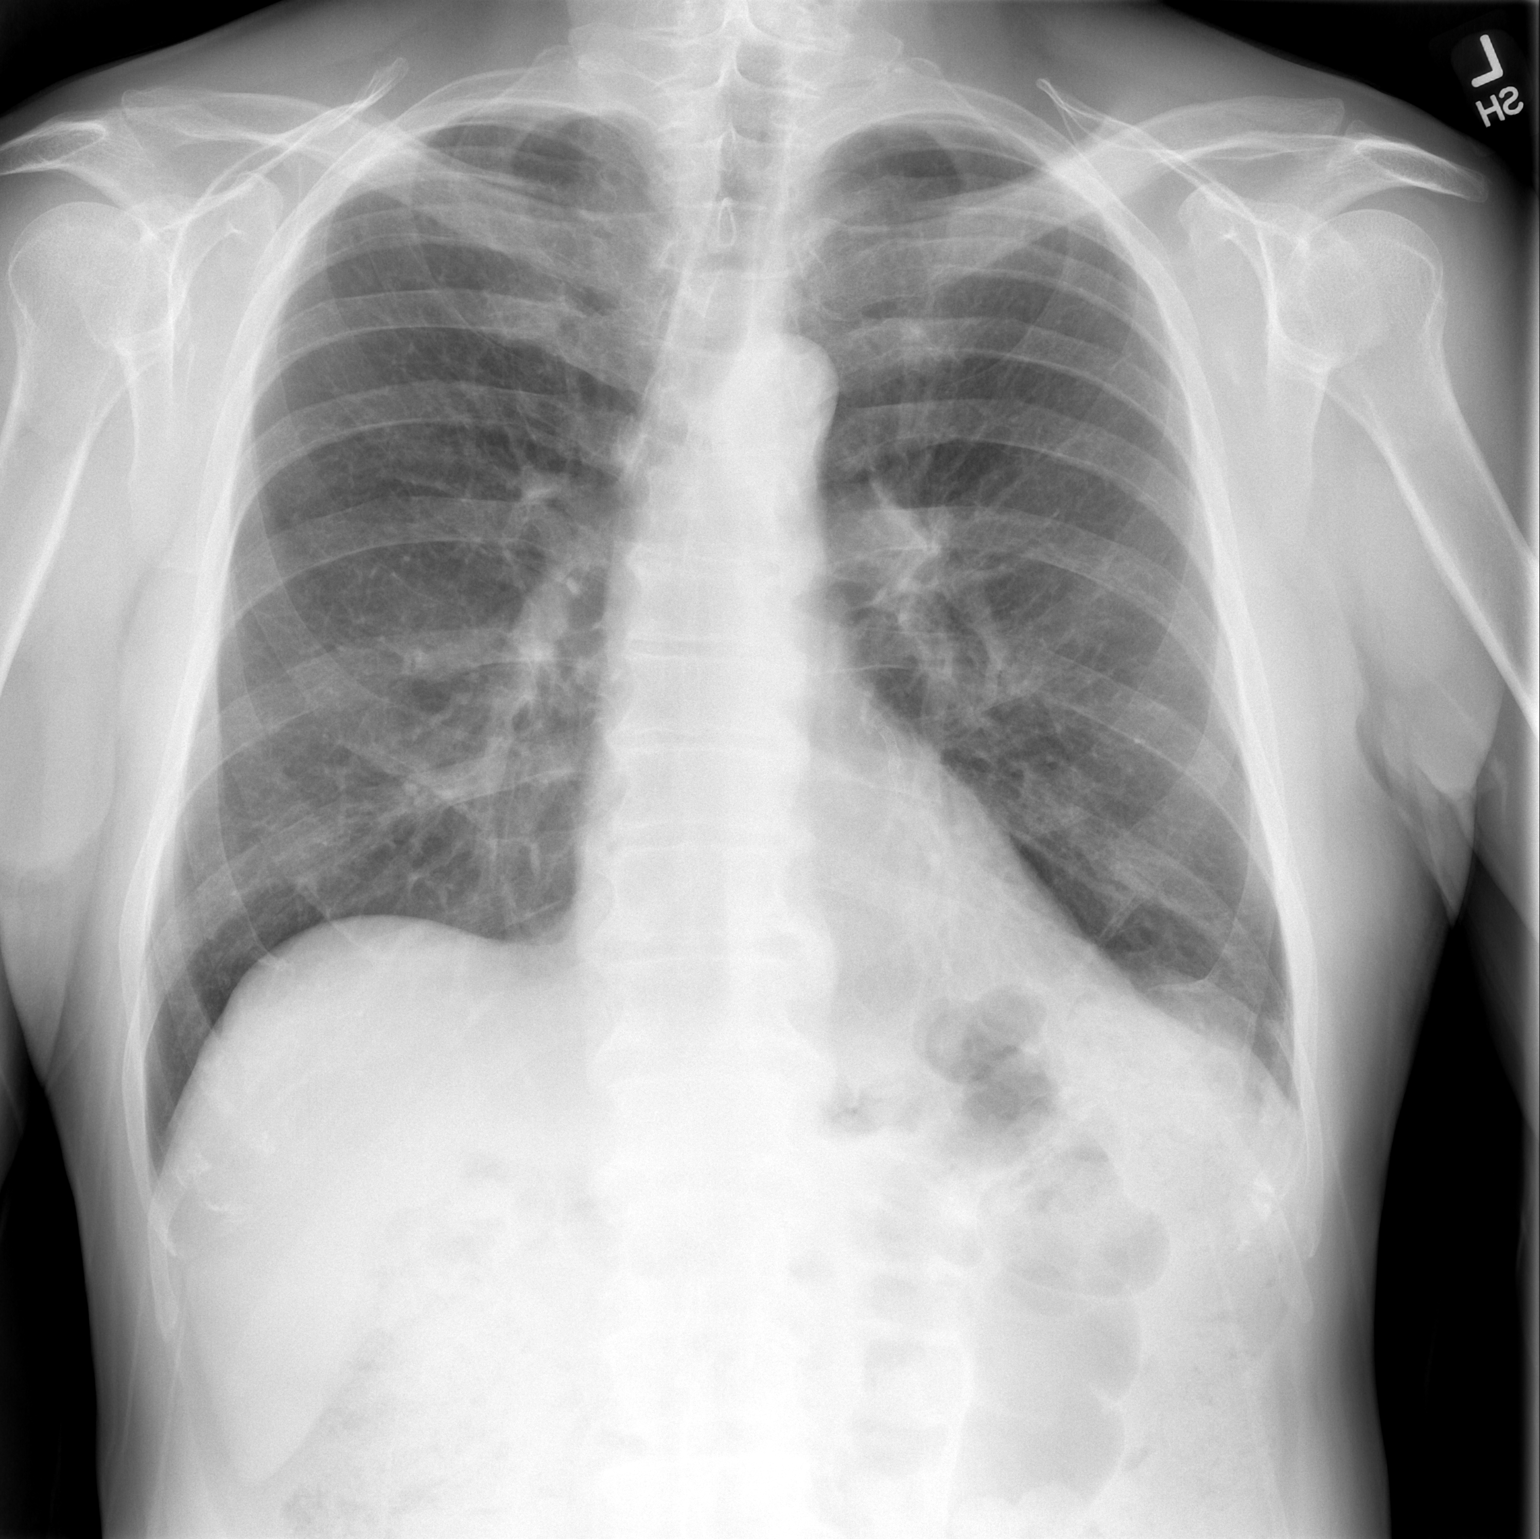

[w chest lat]
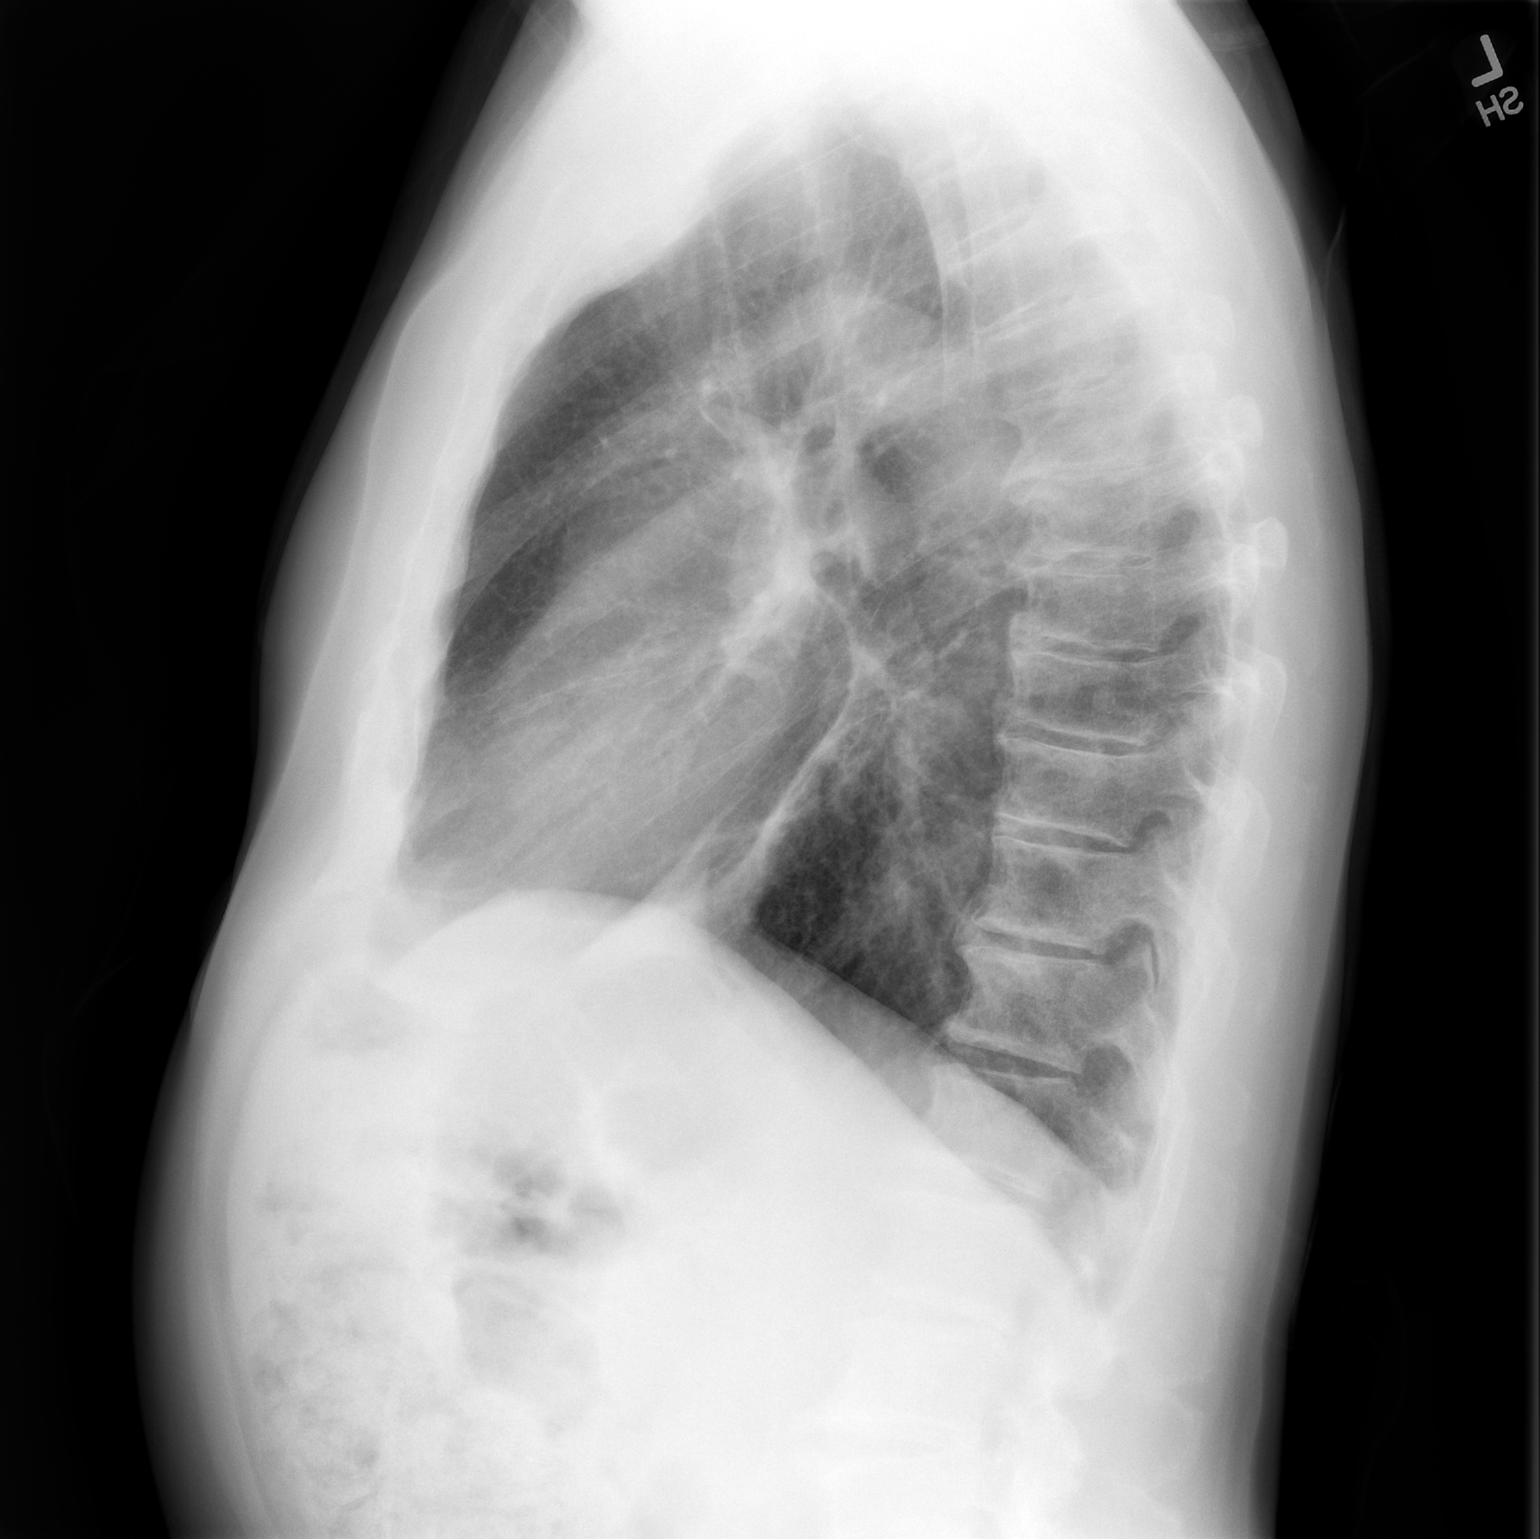

[2 of 2 positions shown; findings below may reference images not displayed]

FINDINGS: The heart size and mediastinal contours are within normal limits.
Normal pulmonary vascularity. New patchy consolidation in the
anterior left lower lobe, silhouetting the left hemidiaphragm. No
pleural effusion or pneumothorax. No acute osseous abnormality.
IMPRESSION: 1. Left lower lobe bronchopneumonia. Followup PA and lateral chest
X-ray is recommended in 3-4 weeks following trial of antibiotic
therapy to ensure resolution.

## 2020-05-31 IMAGING — DX DG CHEST 2V
2 series · 2 of 2 positions shown · non-contrast
Comparison: Radiographs June 10, 2018.

CLINICAL DATA: Pneumonia.

EXAM:
CHEST - 2 VIEW

[chest pa]
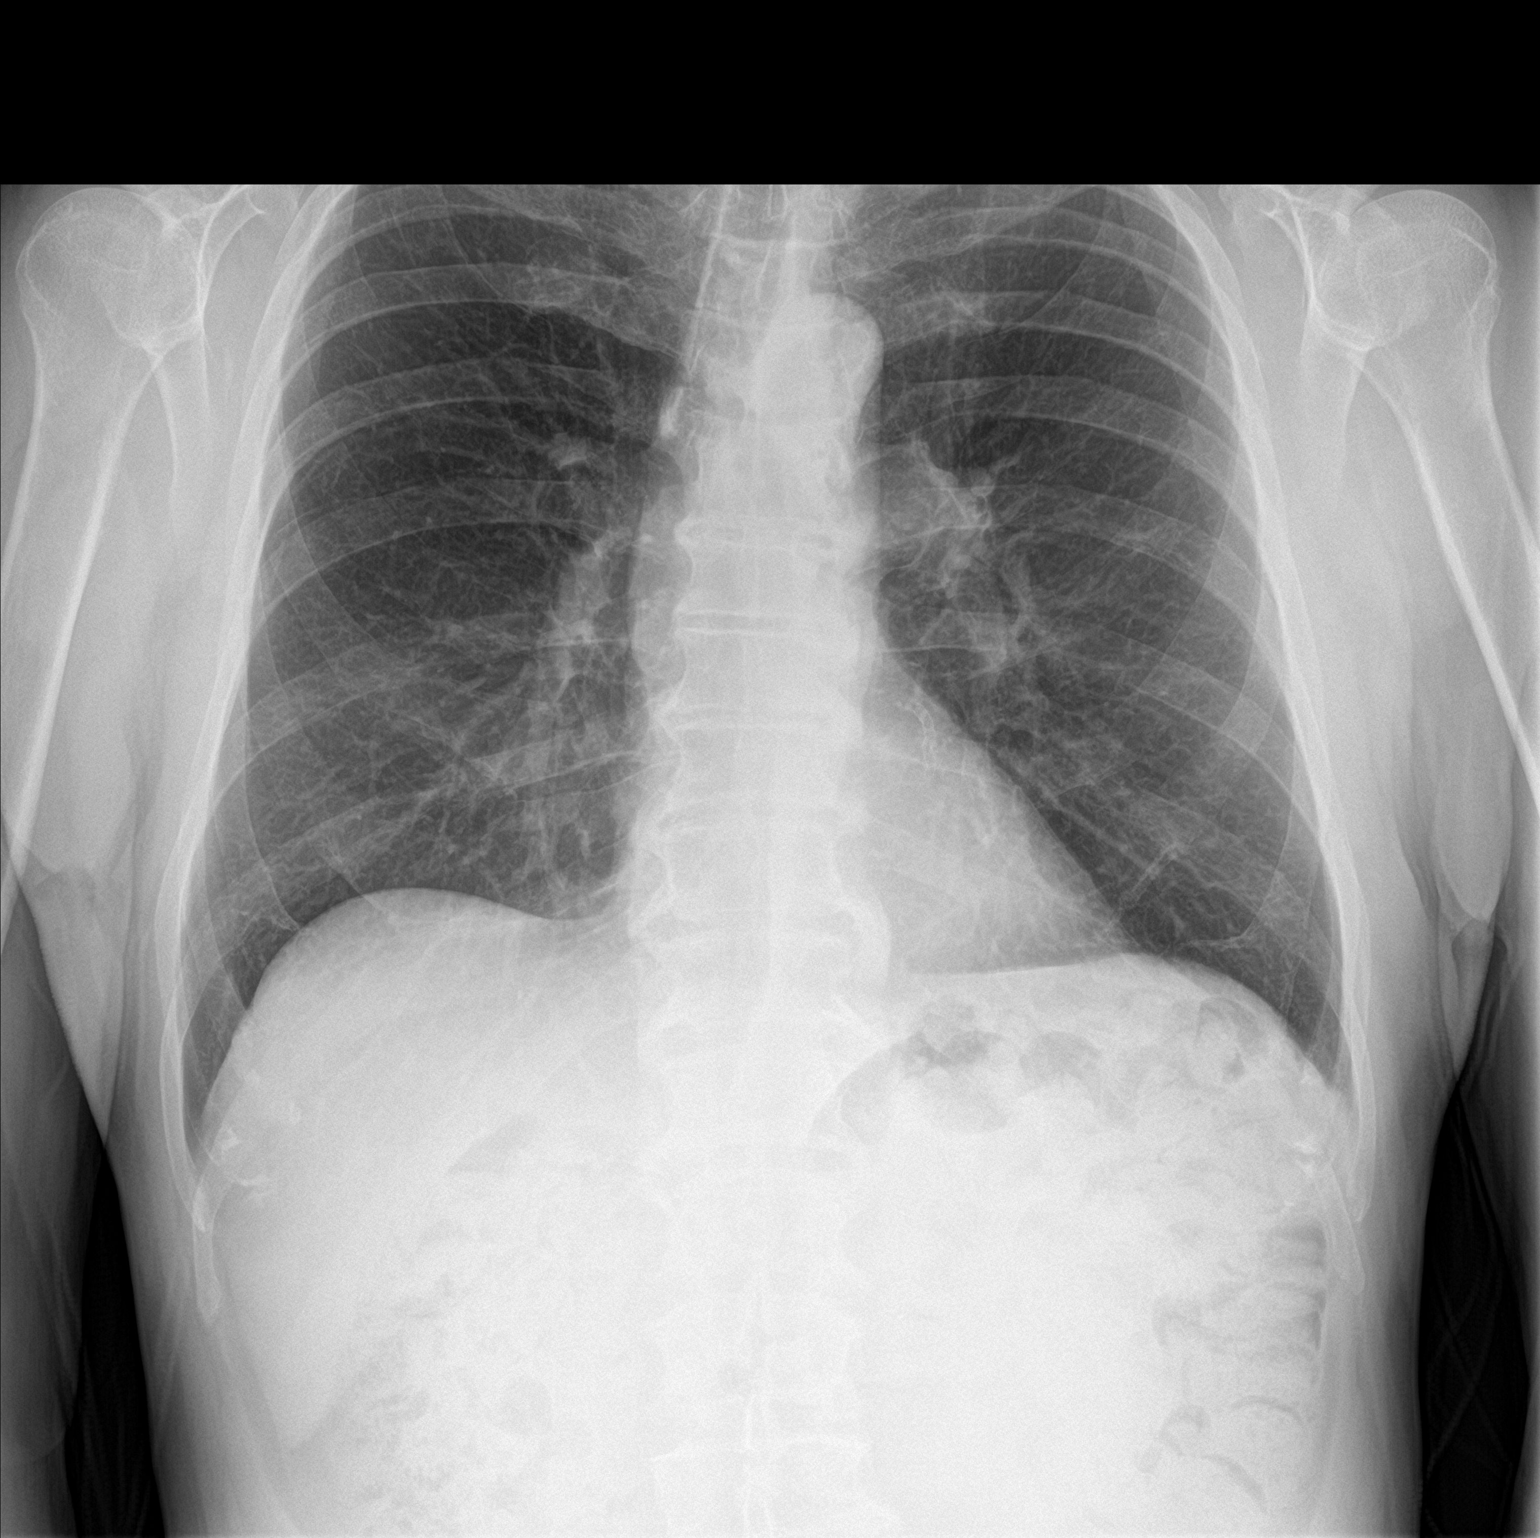

[chest lat]
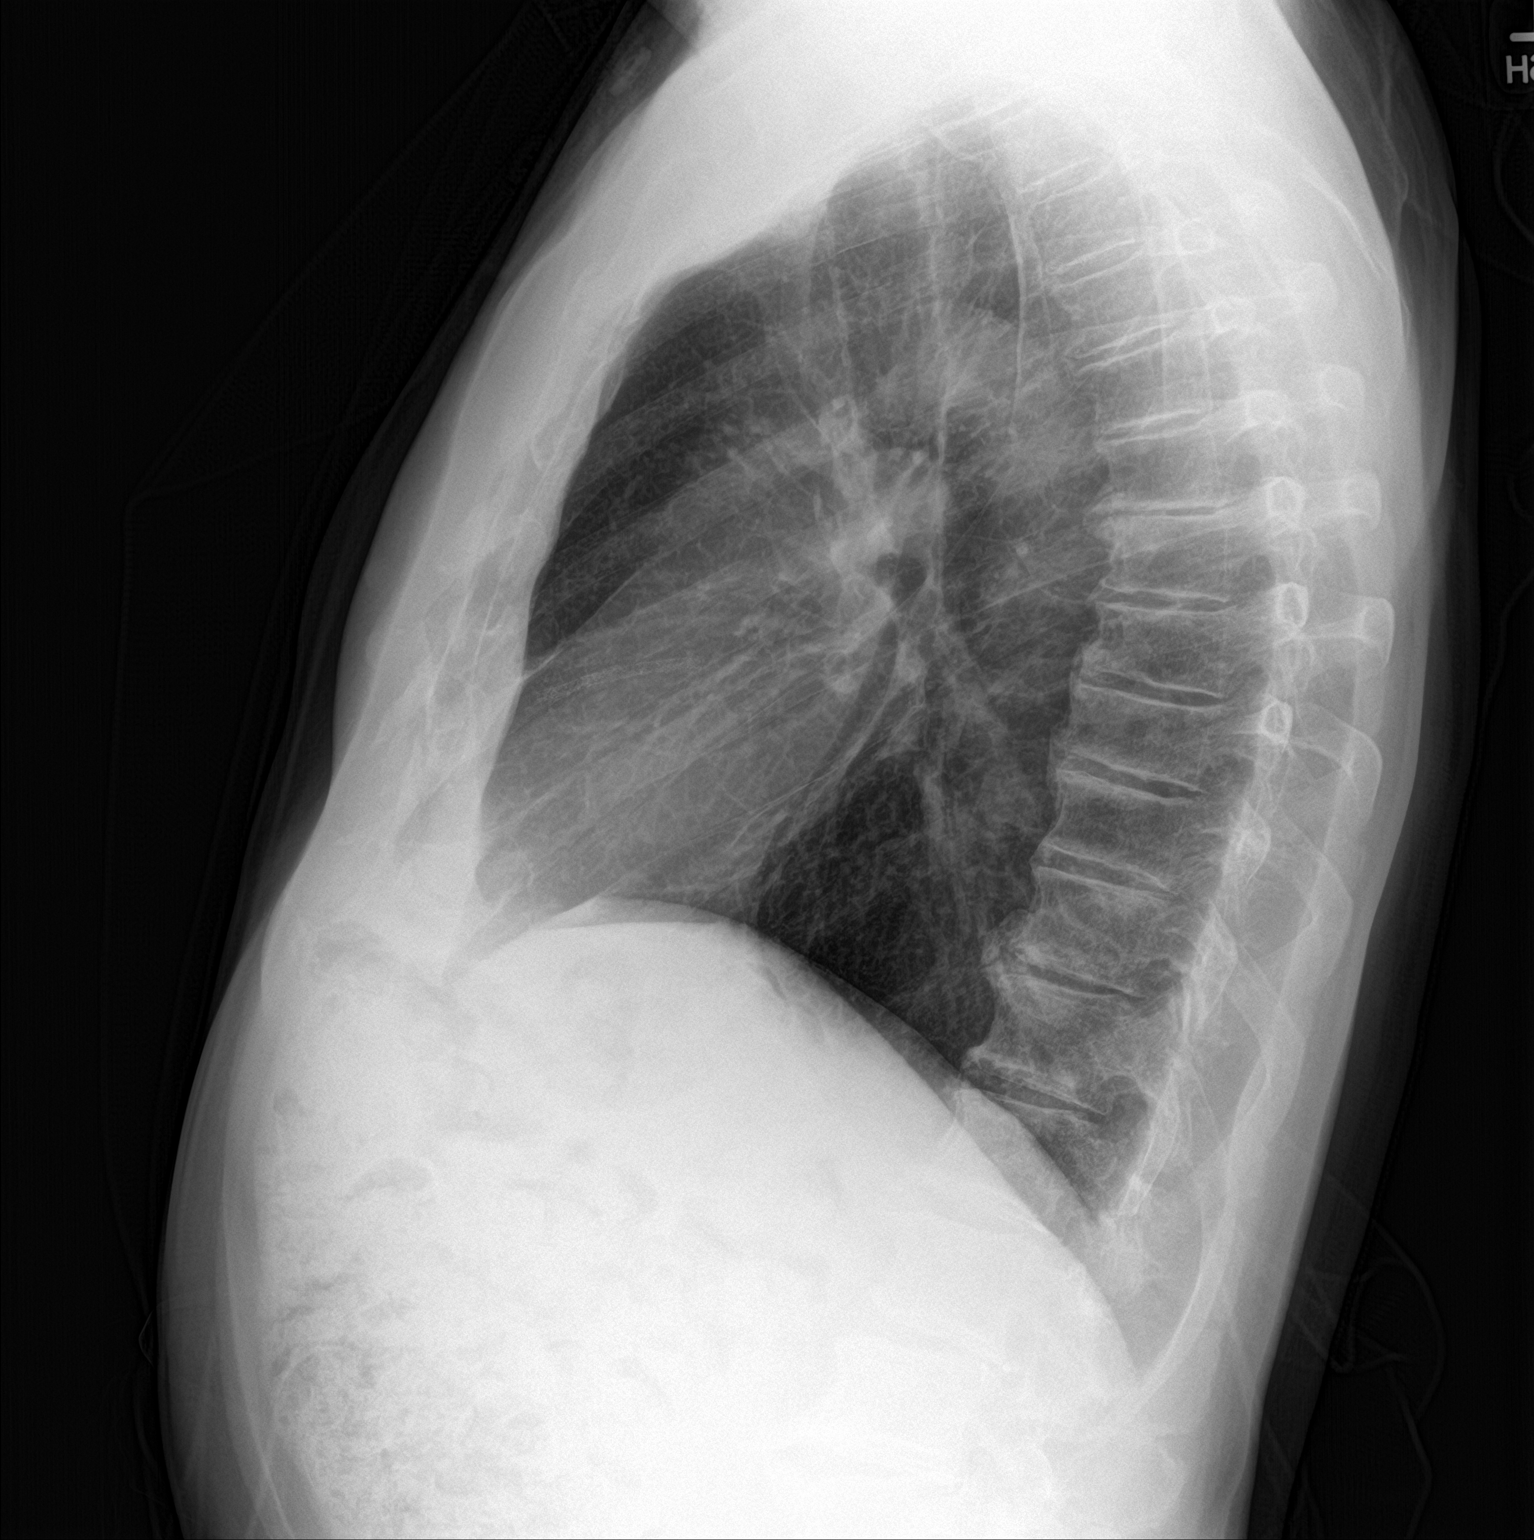

[2 of 2 positions shown; findings below may reference images not displayed]

FINDINGS: The heart size and mediastinal contours are within normal limits.
Both lungs are clear. The visualized skeletal structures are
unremarkable.
IMPRESSION: No active cardiopulmonary disease.

## 2020-05-31 IMAGING — DX DG ABDOMEN 1V
2 series · 2 of 2 positions shown · non-contrast
Comparison: None.

CLINICAL DATA: Intermittent constipation

EXAM:
ABDOMEN - 1 VIEW

[abdomen kub (1 of 2)]
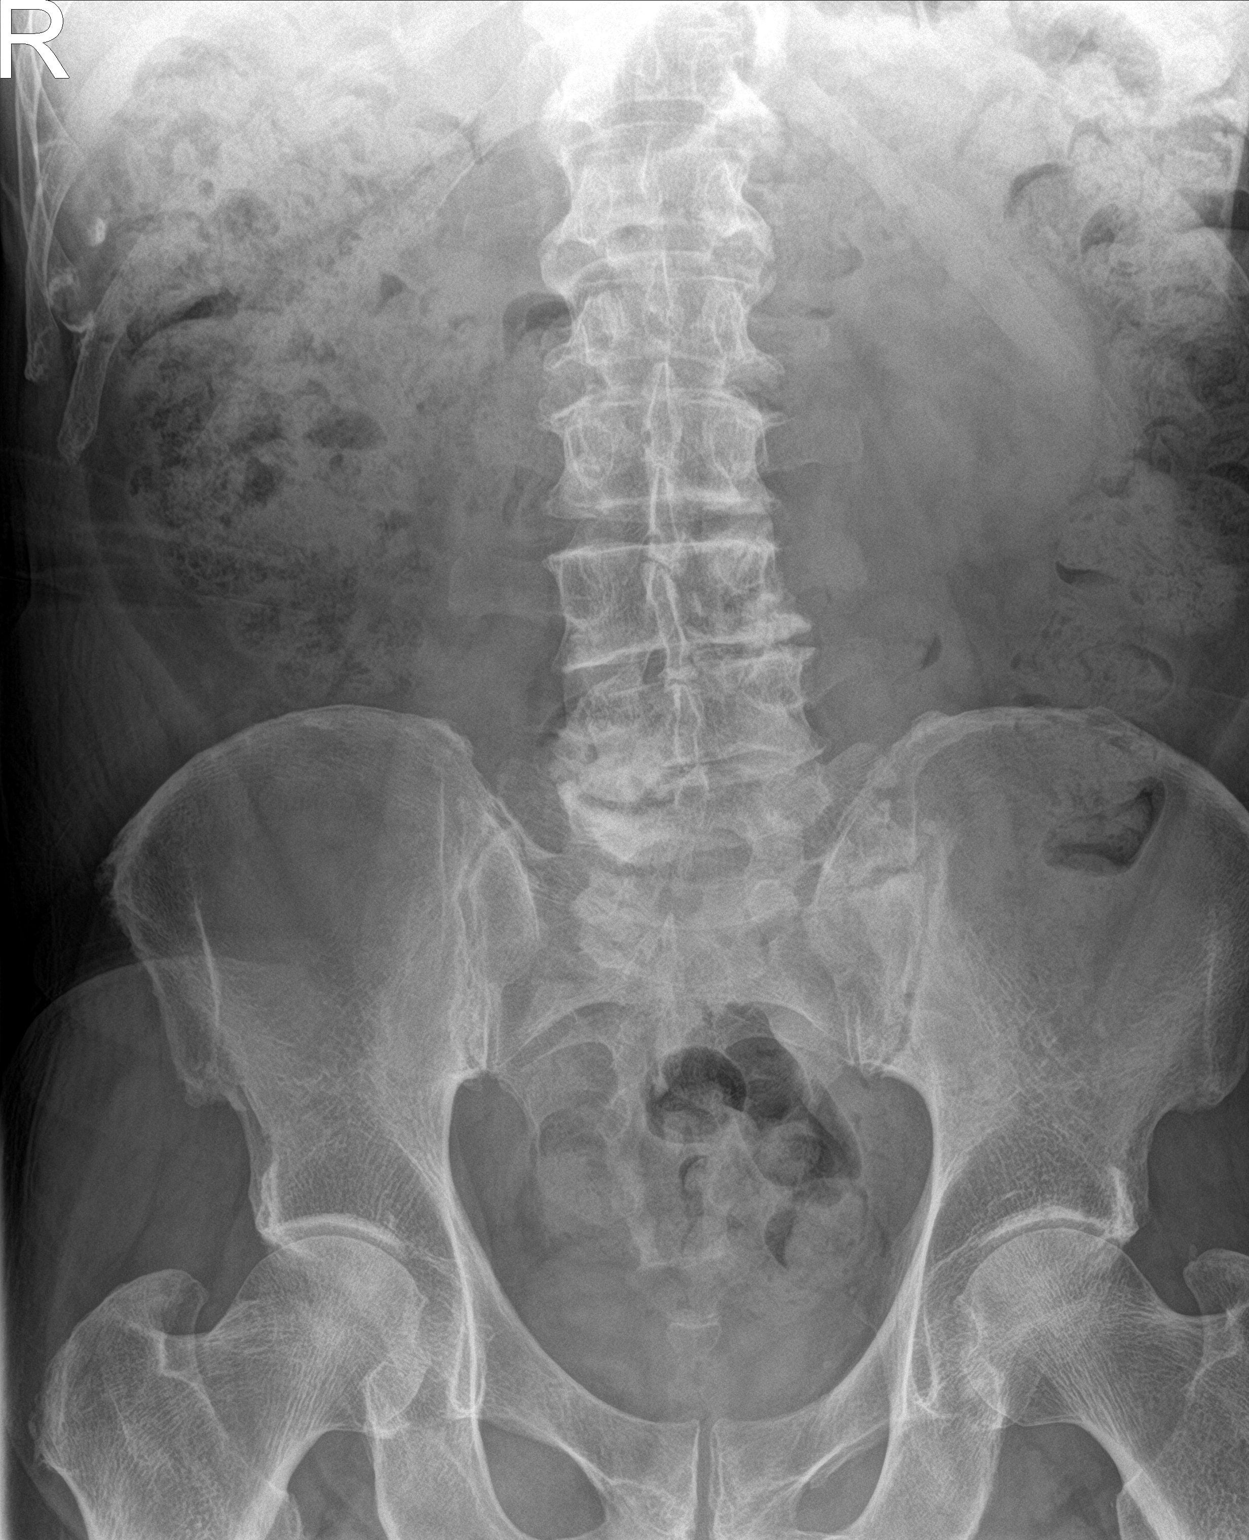

[abdomen kub (2 of 2)]
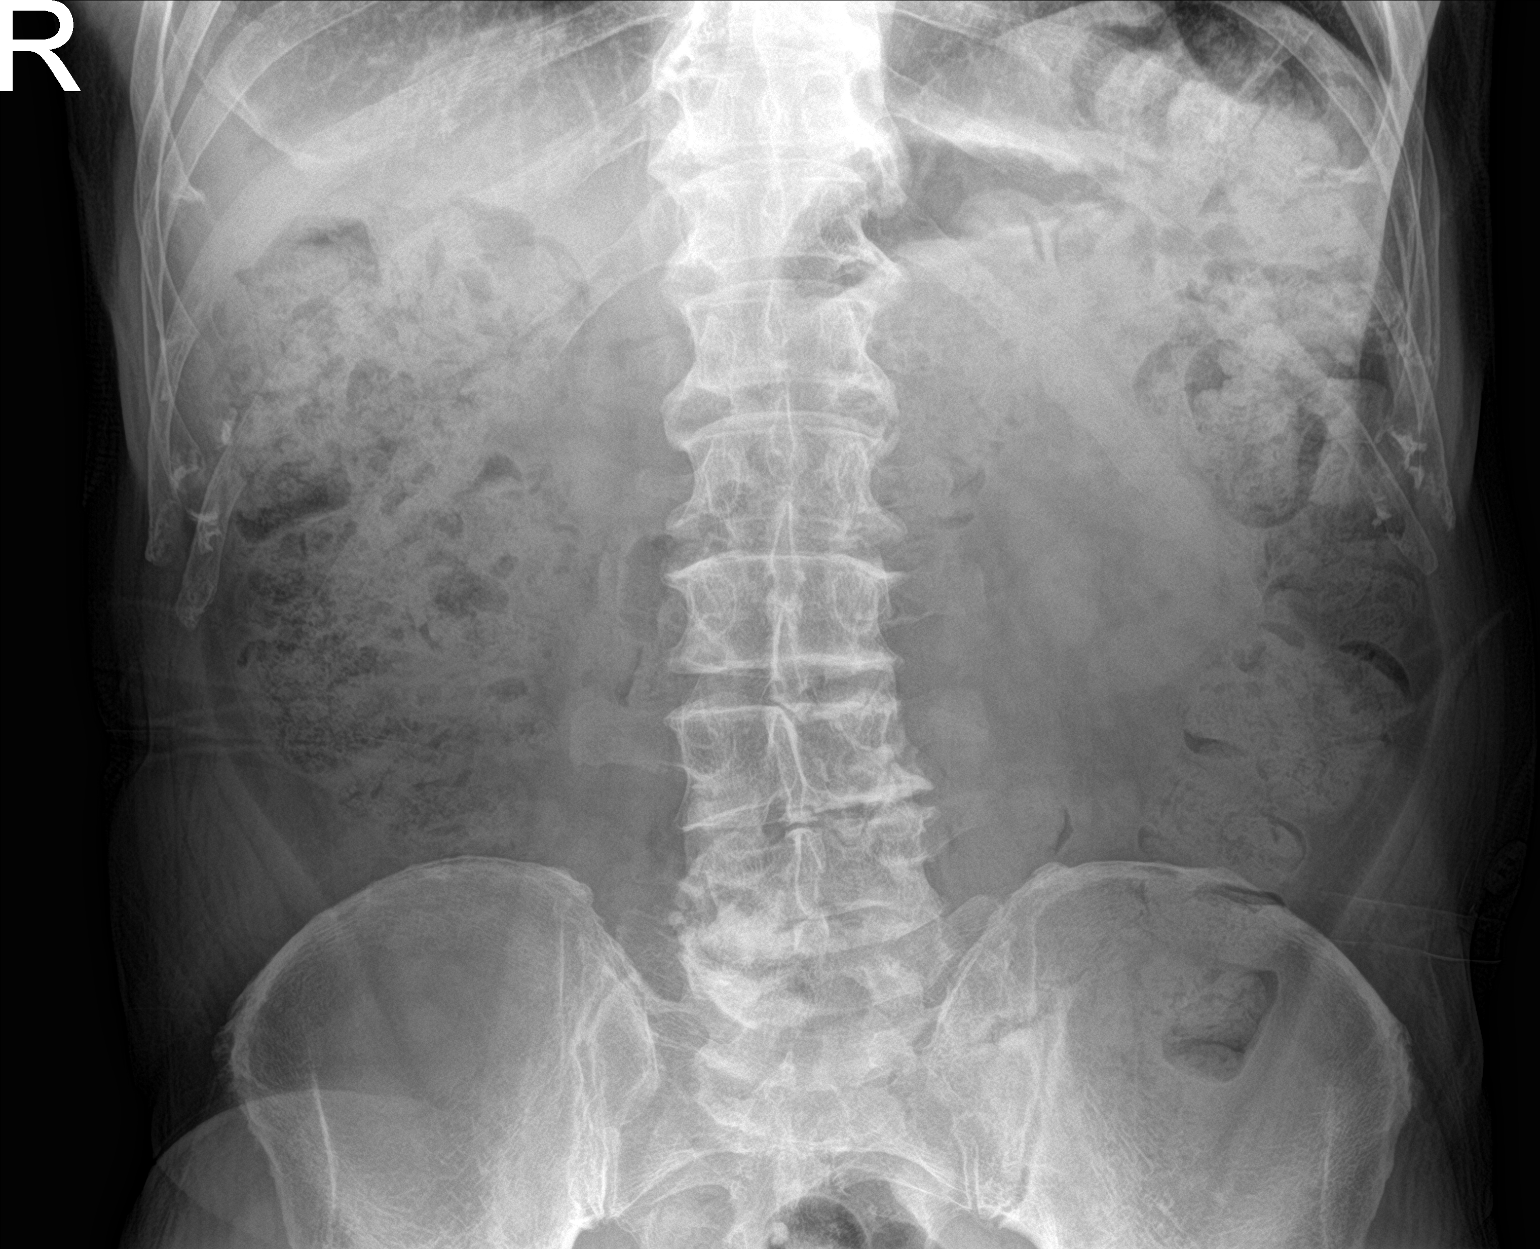

[2 of 2 positions shown; findings below may reference images not displayed]

FINDINGS: There is diffuse stool throughout colon. There is no bowel
dilatation or air-fluid level to suggest bowel obstruction. No free
air. There are tiny phleboliths in the left pelvis.
IMPRESSION: Diffuse stool throughout colon, a finding indicative of
constipation. No bowel obstruction or free air evident.

## 2020-06-15 ENCOUNTER — Encounter: Payer: PPO | Admitting: Internal Medicine

## 2020-06-29 ENCOUNTER — Encounter: Payer: PPO | Admitting: Internal Medicine

## 2020-06-29 ENCOUNTER — Other Ambulatory Visit: Payer: Self-pay

## 2020-06-29 ENCOUNTER — Encounter: Payer: Self-pay | Admitting: Gastroenterology

## 2020-06-29 ENCOUNTER — Ambulatory Visit: Payer: PPO | Admitting: Gastroenterology

## 2020-06-29 ENCOUNTER — Ambulatory Visit (INDEPENDENT_AMBULATORY_CARE_PROVIDER_SITE_OTHER): Payer: PPO | Admitting: Internal Medicine

## 2020-06-29 ENCOUNTER — Encounter: Payer: Self-pay | Admitting: Internal Medicine

## 2020-06-29 VITALS — BP 138/74 | HR 71 | Temp 98.2°F | Ht 67.0 in | Wt 153.0 lb

## 2020-06-29 VITALS — BP 144/64 | HR 78 | Ht 67.0 in | Wt 153.1 lb

## 2020-06-29 DIAGNOSIS — Z23 Encounter for immunization: Secondary | ICD-10-CM

## 2020-06-29 DIAGNOSIS — E785 Hyperlipidemia, unspecified: Secondary | ICD-10-CM

## 2020-06-29 DIAGNOSIS — D649 Anemia, unspecified: Secondary | ICD-10-CM

## 2020-06-29 DIAGNOSIS — R972 Elevated prostate specific antigen [PSA]: Secondary | ICD-10-CM | POA: Diagnosis not present

## 2020-06-29 DIAGNOSIS — K5900 Constipation, unspecified: Secondary | ICD-10-CM | POA: Diagnosis not present

## 2020-06-29 DIAGNOSIS — Z0000 Encounter for general adult medical examination without abnormal findings: Secondary | ICD-10-CM | POA: Diagnosis not present

## 2020-06-29 LAB — BASIC METABOLIC PANEL
BUN: 19 mg/dL (ref 6–23)
CO2: 30 mEq/L (ref 19–32)
Calcium: 9.9 mg/dL (ref 8.4–10.5)
Chloride: 104 mEq/L (ref 96–112)
Creatinine, Ser: 1.01 mg/dL (ref 0.40–1.50)
GFR: 74.86 mL/min (ref >60.00)
Glucose, Bld: 93 mg/dL (ref 70–99)
Potassium: 4.8 mEq/L (ref 3.5–5.1)
Sodium: 140 mEq/L (ref 135–145)

## 2020-06-29 LAB — HEMOGLOBIN A1C: Hgb A1c MFr Bld: 5.9 % (ref 4.6–6.5)

## 2020-06-29 LAB — PSA: PSA: 8.02 ng/mL — ABNORMAL HIGH (ref 0.10–4.00)

## 2020-06-29 MED ORDER — LINACLOTIDE 290 MCG PO CAPS: 290.0000 ug | ORAL_CAPSULE | ORAL | 1 refills | Status: DC | PRN

## 2020-06-29 MED ORDER — POLYETHYLENE GLYCOL 3350 17 G PO PACK: 17.0000 g | PACK | Freq: Every evening | ORAL | Status: DC

## 2020-06-29 MED ORDER — LINACLOTIDE 290 MCG PO CAPS: 290.0000 ug | ORAL_CAPSULE | ORAL | 3 refills | Status: DC | PRN

## 2020-06-29 NOTE — Progress Notes (Signed)
Subjective:    Patient ID: Hunter Vega, male    DOB: 14-Oct-1950, 70 y.o.   MRN: [id number]  DOS:  06/29/2020 Type of visit - description: CPX No major concerns. Continue with tremors, right arm, see last visit.   Review of Systems  Other than above, a 14 point review of systems is negative      Past Medical History:  Diagnosis Date  . Allergy    mild seasonal  . BCC (basal cell carcinoma of skin)   . Bilateral inguinal hernia s/p lap repair with mesh 09/26/2017 09/26/2017  . CAD (coronary artery disease) 11/04/2015  . Constipation    started Linzess monday 08-12-2018   . Encounter for long-term (current) use of medications 12/18/2013  . History of seizure    reports no seizure since 2015   . Hx of exercise stress test    reports  had done to eval since coronary artery stent was placed ; reports test was "normal"   . Hyperlipidemia   . Left leg numbness 11/22/2016  . Mixed hyperlipidemia 11/24/2009   Overview:  IMPRESSION: Check labs today.  Continue Pravachol 40 mg daily.  . Mixed sleep apnea 03/30/2011  . Partial epilepsy with impairment of consciousness ([place]) 12/18/2013  . PCP NOTES >>>>>>> 02/15/2020  . Polypharmacy 12/18/2013  . PSVT (paroxysmal supraventricular tachycardia) ([place])    reports no knowledge of this   . Seizure disorder ([place]) 05/10/2011  . Seizures ([place])    last 2015  . Sleep apnea    reports no longer has sicne losing weight     Past Surgical History:  Procedure Laterality Date  . CARDIAC CATHETERIZATION    . COLONOSCOPY    . CORONARY ANGIOPLASTY    . CORONARY STENT PLACEMENT  2015   x2. Mid LAD  . INGUINAL HERNIA REPAIR Bilateral 09/26/2017   Procedure: LAPAROSCOPIC BILATERAL INGUINAL HERNIA REPAIR;  Surgeon: Michael Boston, MD;  Location: [facility];  Service: General;  Laterality: Bilateral;  . INSERTION OF MESH Bilateral 09/26/2017   Procedure: INSERTION OF MESH;  Surgeon: Michael Boston, MD;  Location: [facility];  Service: General;  Laterality: Bilateral;     Allergies as of 06/29/2020      Reactions   Penicillins Swelling   Arm swelling Has patient had a PCN reaction causing immediate rash, facial/tongue/throat swelling, SOB or lightheadedness with hypotension: No Has patient had a PCN reaction causing severe rash involving mucus membranes or skin necrosis: No Has patient had a PCN reaction that required hospitalization: No Has patient had a PCN reaction occurring within the last 10 years: No If all of the above answers are "NO", then may proceed with Cephalosporin use.      Medication List       Accurate as of June 29, 2020 11:59 PM. If you have any questions, ask your nurse or doctor.        aspirin EC 81 MG tablet Take 81 mg by mouth daily.   atorvastatin 80 MG tablet Commonly known as: LIPITOR Take 1 tablet (80 mg total) by mouth at bedtime.   lamoTRIgine 150 MG tablet Commonly known as: LAMICTAL Take 1 tablet (150 mg total) by mouth 2 (two) times daily.   linaclotide 290 MCG Caps capsule Commonly known as: Linzess Take 1 capsule (290 mcg total) by mouth as needed. What changed: See the new instructions. Changed by: Stevan Born, CMA   polyethylene glycol 17 g packet Commonly known as: MIRALAX / GLYCOLAX Take 17 g  by mouth at bedtime. What changed: when to take this Changed by: Milus Banister, MD          Objective:   Physical Exam BP 138/74 (BP Location: Left Arm, Patient Position: Sitting, Cuff Size: Large)   Pulse 71   Temp 98.2 F (36.8 C) (Oral)   Ht 5\' 7"  (1.702 m)   Wt 153 lb (69.4 kg)   SpO2 99%   BMI 23.96 kg/m  General: Well developed, NAD, BMI noted Neck: No  thyromegaly  HEENT:  Normocephalic . Face symmetric, atraumatic Lungs:  CTA B Normal respiratory effort, no intercostal retractions, no accessory muscle use. Heart: RRR,  no murmur.  Abdomen:  Not distended, soft, non-tender. No rebound or rigidity. DRE: Normal sphincter tone, brown stools. Small prostate, he does have very  small indurations (2 mm?) : one on the right, 2 on the left.  Nontender Lower extremities: no pretibial edema bilaterally  Skin: Exposed areas without rash. Not pale. Not jaundice Neurologic:  alert & oriented X3.  Speech normal, gait appropriate for age and unassisted Strength symmetric and appropriate for age.  Psych: Cognition and judgment appear intact.  Cooperative with normal attention span and concentration.  Behavior appropriate. No anxious or depressed appearing.     Assessment     Assessment CAD PCI  stent LAD 2014   PSVT H/o [facility] 2019 Neurology (Dr. Jannifer Franklin) -H/o Sz -Possible Parkinson's disease Chronic constipation (GI Dr [name])  PLAN Here for CPX CAD: Asymptomatic. Patient concerned about his BP, ideal BP ~ 120/80, check ambulatory BPs.  See AVS High cholesterol: Well-controlled on atorvastatin Seizures: Stable, still have right arm tremor, neurology visit pending. Anemia : stable x years but requests iron-ferritin, will do  RTC 6 months  This visit occurred during the SARS-CoV-2 public health emergency.  Safety protocols were in place, including screening questions prior to the visit, additional usage of staff PPE, and extensive cleaning of exam room while observing appropriate contact time as indicated for disinfecting solutions.

## 2020-06-29 NOTE — Patient Instructions (Addendum)
If you are age 72 or older, your body mass index should be between 23-30. Your Body mass index is 23.98 kg/m. If this is out of the aforementioned range listed, please consider follow up with your Primary Care Provider.  Please take Linzess as needed.  Please take Miralax at night as tolerated.  Please stay well hydrated by drinking 64 ounces of fluids daily.  Thank you for entrusting me with your care and choosing [facility].  Dr [name]

## 2020-06-29 NOTE — Progress Notes (Signed)
Review of pertinent gastrointestinal problems: 1.History of adenomatous colon polyps, low risk. Colonoscopy Dr. [name] 2063found external hemorrhoids and mild sigmoid diverticulosis. Colonoscopy March 2020DrArdis Hughs for change in bowel habits found a 2 mm polyp in his ascending colon, the examination was otherwise normal. The polyp was a small tubular adenoma and I recommended a repeat colonoscopy at 7-year interval 2.Change in bowel habits, abdominal pains 2019/2020 work-up includes CT scan 2/2047for abdominal pain showed normal appendix, normal colon with some increased amount of stool,labs 2020 TSH was normal,CBC and complete metabolic profile were also normal.   HPI: This is a very pleasant 70 year old man   He was last here in our office about 5 months ago and he saw [place].  They discussed his chronic constipation and bloating.  She recommended that he continue his Linzess 290 mcg once daily, MiraLAX twice daily, Dulcolax tablets every few nights as needed.  He called here a couple months ago and asked to switch from Linzess to [employer].  Lab work September [id number] complete metabolic profile was normal, CBC was normal except for hemoglobin 12.8 which is slightly low, his MCV was 97.  Today he tells me that he never switched to Trulance.  Instead he is taking Linzess 290 mcg pills 1 pill about 3 times per week.  He is also taking MiraLAX at bedtime every night a half capful to a full capful.  On this regimen he is quite satisfied with his bowels.  He does admit that about once a month he will have a very large BM and then no BM at all for 2 or 3 days.  He has had no bleeding.   ROS: complete GI ROS as described in HPI, all other review negative.  Constitutional:  No unintentional weight loss   Past Medical History:  Diagnosis Date  . Allergy    mild seasonal  . BCC (basal cell carcinoma of skin)   . Bilateral inguinal hernia s/p lap repair with mesh 09/26/2017 09/26/2017  .  CAD (coronary artery disease) 11/04/2015  . Constipation    started Linzess monday 08-12-2018   . Encounter for long-term (current) use of medications 12/18/2013  . History of seizure    reports no seizure since 2015   . Hx of exercise stress test    reports  had done to eval since coronary artery stent was placed ; reports test was "normal"   . Hyperlipidemia   . Left leg numbness 11/22/2016  . Mixed hyperlipidemia 11/24/2009   Overview:  IMPRESSION: Check labs today.  Continue Pravachol 40 mg daily.  . Mixed sleep apnea 03/30/2011  . Partial epilepsy with impairment of consciousness ([place]) 12/18/2013  . PCP NOTES >>>>>>> 02/15/2020  . Polypharmacy 12/18/2013  . PSVT (paroxysmal supraventricular tachycardia) ([place])    reports no knowledge of this   . Seizure disorder ([place]) 05/10/2011  . Seizures ([place])    last 2015  . Sleep apnea    reports no longer has sicne losing weight     Past Surgical History:  Procedure Laterality Date  . CARDIAC CATHETERIZATION    . COLONOSCOPY    . CORONARY ANGIOPLASTY    . CORONARY STENT PLACEMENT  2015   x2. Mid LAD  . INGUINAL HERNIA REPAIR Bilateral 09/26/2017   Procedure: LAPAROSCOPIC BILATERAL INGUINAL HERNIA REPAIR;  Surgeon: Michael Boston, MD;  [facility];  Service: General;  Laterality: Bilateral;  . INSERTION OF MESH Bilateral 09/26/2017   Procedure: INSERTION OF MESH;  Surgeon: Michael Boston, MD;  Location: [facility];  Service: General;  Laterality: Bilateral;    Current Outpatient Medications  Medication Sig Dispense Refill  . aspirin EC 81 MG tablet Take 81 mg by mouth daily.     Marland Kitchen atorvastatin (LIPITOR) 80 MG tablet Take 1 tablet (80 mg total) by mouth at bedtime. 90 tablet 3  . lamoTRIgine (LAMICTAL) 150 MG tablet Take 1 tablet (150 mg total) by mouth 2 (two) times daily. 180 tablet 3  . LINZESS 290 MCG CAPS capsule TAKE 1 CAPSULE BY MOUTH ONCE DAILY BEFORE BREAKFAST 90 capsule 1  . polyethylene glycol (MIRALAX / GLYCOLAX) 17 g packet Take 17 g  by mouth daily.     No current facility-administered medications for this visit.    Allergies as of 06/29/2020 - Review Complete 06/29/2020  Allergen Reaction Noted  . Penicillins Swelling 06/04/2009    Family History  Problem Relation Age of Onset  . Liver disease Mother   . Heart attack Father   . Diabetes Paternal Uncle   . Colon cancer Neg Hx   . Esophageal cancer Neg Hx   . Prostate cancer Neg Hx   . Colon polyps Neg Hx   . Rectal cancer Neg Hx   . Stomach cancer Neg Hx     Social History   Socioeconomic History  . Marital status: Married    Spouse name: Not on file  . Number of children: 3  . Years of education: Not on file  . Highest education level: Not on file  Occupational History  . Not on file  Tobacco Use  . Smoking status: Never Smoker  . Smokeless tobacco: Never Used  Vaping Use  . Vaping Use: Never used  Substance and Sexual Activity  . Alcohol use: No  . Drug use: No  . Sexual activity: Yes  Other Topics Concern  . Not on file  Social History Narrative   Lives with wife   Caffeine use: 1 cup corfee daily   Left handed    Social Determinants of Health   Financial Resource Strain: Low Risk   . Difficulty of Paying Living Expenses: Not hard at all  Food Insecurity: No Food Insecurity  . Worried About Charity fundraiser in the Last Year: Never true  . Ran Out of Food in the Last Year: Never true  Transportation Needs: No Transportation Needs  . Lack of Transportation (Medical): No  . Lack of Transportation (Non-Medical): No  Physical Activity: Not on file  Stress: Not on file  Social Connections: Not on file  Intimate Partner Violence: Not on file     Physical Exam: BP (!) 144/64 (BP Location: Left Arm, Patient Position: Sitting, Cuff Size: Normal)   Pulse 78   Ht 5\' 7"  (1.702 m)   Wt 153 lb 2 oz (69.5 kg)   BMI 23.98 kg/m   Constitutional: generally well-appearing Psychiatric: alert and oriented x3 Abdomen: soft, nontender,  nondistended, no obvious ascites, no peritoneal signs, normal bowel sounds No peripheral edema noted in lower extremities  Assessment and plan: 70 y.o. male with chronic constipation  He is doing well on as needed Linzess which is about 3 times per week as well as scheduled MiraLAX.  He will continue this regimen indefinitely.  He knows to call if he has further questions or concerns.  He will try to stay hydrated as best that he can.  He has for samples of Linzess and we will give him samples if we have any.  Please  see the "Patient Instructions" section for addition details about the plan.  Owens Loffler, MD [facility] 06/29/2020, 9:18 AM   Total time on date of encounter was 25 minutes (this included time spent preparing to see the patient reviewing records; obtaining and/or reviewing separately obtained history; performing a medically appropriate exam and/or evaluation; counseling and educating the patient and family if present; ordering medications, tests or procedures if applicable; and documenting clinical information in the health record).

## 2020-06-29 NOTE — Patient Instructions (Addendum)
Check the  blood pressure 2 or 3 times a month  Be sure your blood pressure is between 110/65 and  145/85.  if it is consistently higher or lower, let me know  To find a good quality blood pressure cuff: [email or web address]     Consider get Shingrix vaccination at the pharmacy  [facility] : Get the blood work     [place], [place] back for   for a checkup in 6 months

## 2020-06-30 ENCOUNTER — Encounter: Payer: Self-pay | Admitting: Internal Medicine

## 2020-06-30 ENCOUNTER — Other Ambulatory Visit: Payer: Self-pay

## 2020-06-30 LAB — IRON,TIBC AND FERRITIN PANEL
%SAT: 40 % (calc) (ref 20–48)
Ferritin: 113 ng/mL (ref 24–380)
Iron: 114 ug/dL (ref 50–180)
TIBC: 287 mcg/dL (calc) (ref 250–425)

## 2020-06-30 MED ORDER — BLOOD PRESSURE KIT: PACK | 0 refills | Status: DC

## 2020-06-30 MED ORDER — POLYETHYLENE GLYCOL 3350 17 GM/SCOOP PO POWD: ORAL | 0 refills | Status: DC

## 2020-06-30 NOTE — Assessment & Plan Note (Signed)
Td 2011: Booster at the next opportunity Zostavax 2015 Shingrix: Recommend to get at the pharmacy [facility] 13: 2019 PNM 23: Today Covid VAX x3 Had a flu shot CCS: Colonoscopy 07-2018, next per GI Prostate cancer screening: Last PSA 2020: 3.69, DRE today slightly abnormal, check a PSA, low threshold for referral.  Addendum PSA elevated, referred to urology) Labs:BMP, A1c, PSA, iron, ferritin

## 2020-06-30 NOTE — Assessment & Plan Note (Signed)
Here for CPX CAD: Asymptomatic. Patient concerned about his BP, ideal BP ~ 120/80, check ambulatory BPs.  See AVS High cholesterol: Well-controlled on atorvastatin Seizures: Stable, still have right arm tremor, neurology visit pending. Anemia : stable x years but requests iron-ferritin, will do  RTC 6 months

## 2020-07-06 ENCOUNTER — Telehealth: Payer: Self-pay | Admitting: Internal Medicine

## 2020-07-06 MED ORDER — BLOOD PRESSURE KIT: PACK | 0 refills | Status: DC

## 2020-07-06 NOTE — Telephone Encounter (Signed)
Pharmacy called for clarification on blood pressure kit instructions. Please indicate if patient needs blood pressure of glucose meter   Blood Pressure KIT [[id number]]   Order Details Dose, Route, Frequency: As Directed  Dispense Quantity: 1 kit Refills: 0       Sig: Check blood sugars 2-3 times weekly      Start Date: 06/30/20 End Date: --  Written Date: 06/30/20 Expiration Date: 06/30/21   [facility] [address] [place], [place] - [address]  [address] [place] [address]  Phone:  [phone] Fax:  [phone]  DEA #:  --  DAW Reason: --

## 2020-07-06 NOTE — Telephone Encounter (Signed)
Rx updated to say blood pressures instead of blood sugars and resent to [facility].

## 2020-07-15 ENCOUNTER — Ambulatory Visit: Payer: PPO | Admitting: Neurology

## 2020-07-15 ENCOUNTER — Encounter: Payer: Self-pay | Admitting: Neurology

## 2020-07-15 ENCOUNTER — Other Ambulatory Visit: Payer: Self-pay

## 2020-07-15 VITALS — BP 156/75 | HR 83 | Ht 68.0 in | Wt 154.6 lb

## 2020-07-15 DIAGNOSIS — Z5181 Encounter for therapeutic drug level monitoring: Secondary | ICD-10-CM

## 2020-07-15 DIAGNOSIS — G40909 Epilepsy, unspecified, not intractable, without status epilepticus: Secondary | ICD-10-CM

## 2020-07-15 MED ORDER — LAMOTRIGINE 150 MG PO TABS: 150.0000 mg | ORAL_TABLET | Freq: Two times a day (BID) | ORAL | 3 refills | Status: DC

## 2020-07-15 NOTE — Progress Notes (Signed)
Reason for visit: Seizures, tremor  Hunter Vega is an 70 y.o. male  History of present illness:  Hunter Vega is a 70 year old left-handed white male with a history of seizures that have been well controlled with Lamictal.  He has developed over the last year or year and a half a resting tremor that is primarily involving the right hand.  He has not noted any other symptoms of clumsiness or slowness of movement or gait instability.  He is left-handed, he has noted some sloppiness with his handwriting.  He has noted tremor when he is holding something such as a knife or fork with his right hand.  He recently has noted that his PSA level has gone up to 8, he will be seeing urology in the near future.  He returns this office for further evaluation.  He has not had any change in his functional level since last seen.  Past Medical History:  Diagnosis Date  . Allergy    mild seasonal  . BCC (basal cell carcinoma of skin)   . Bilateral inguinal hernia s/p lap repair with mesh 09/26/2017 09/26/2017  . CAD (coronary artery disease) 11/04/2015  . Constipation    started Linzess monday 08-12-2018   . Encounter for long-term (current) use of medications 12/18/2013  . History of seizure    reports no seizure since 2015   . Hx of exercise stress test    reports  had done to eval since coronary artery stent was placed ; reports test was "normal"   . Hyperlipidemia   . Left leg numbness 11/22/2016  . Mixed hyperlipidemia 11/24/2009   Overview:  IMPRESSION: Check labs today.  Continue Pravachol 40 mg daily.  . Mixed sleep apnea 03/30/2011  . Partial epilepsy with impairment of consciousness ([place]) 12/18/2013  . PCP NOTES >>>>>>> 02/15/2020  . Polypharmacy 12/18/2013  . PSVT (paroxysmal supraventricular tachycardia) ([place])    reports no knowledge of this   . Seizure disorder ([place]) 05/10/2011  . Seizures ([place])    last 2015  . Sleep apnea    reports no longer has sicne losing weight     Past Surgical  History:  Procedure Laterality Date  . CARDIAC CATHETERIZATION    . COLONOSCOPY    . CORONARY ANGIOPLASTY    . CORONARY STENT PLACEMENT  2015   x2. Mid LAD  . INGUINAL HERNIA REPAIR Bilateral 09/26/2017   Procedure: LAPAROSCOPIC BILATERAL INGUINAL HERNIA REPAIR;  Surgeon: Michael Boston, MD;  Location: [facility];  Service: General;  Laterality: Bilateral;  . INSERTION OF MESH Bilateral 09/26/2017   Procedure: INSERTION OF MESH;  Surgeon: Michael Boston, MD;  Location: [facility];  Service: General;  Laterality: Bilateral;    Family History  Problem Relation Age of Onset  . Liver disease Mother   . Heart attack Father   . Diabetes Paternal Uncle   . Colon cancer Neg Hx   . Esophageal cancer Neg Hx   . Prostate cancer Neg Hx   . Colon polyps Neg Hx   . Rectal cancer Neg Hx   . Stomach cancer Neg Hx     Social history:  reports that he has never smoked. He has never used smokeless tobacco. He reports that he does not drink alcohol and does not use drugs.    Allergies  Allergen Reactions  . Penicillins Swelling    Arm swelling Has patient had a PCN reaction causing immediate rash, facial/tongue/throat swelling, SOB or lightheadedness with hypotension: No Has  patient had a PCN reaction causing severe rash involving mucus membranes or skin necrosis: No Has patient had a PCN reaction that required hospitalization: No Has patient had a PCN reaction occurring within the last 10 years: No If all of the above answers are "NO", then may proceed with Cephalosporin use.     Medications:  Prior to Admission medications   Medication Sig Start Date End Date Taking? Authorizing Provider  aspirin EC 81 MG tablet Take 81 mg by mouth daily.    Yes [provider]  atorvastatin (LIPITOR) 80 MG tablet Take 1 tablet (80 mg total) by mouth at bedtime. 01/07/20  Yes Richardo Priest, MD  lamoTRIgine (LAMICTAL) 150 MG tablet Take 1 tablet (150 mg total) by mouth 2 (two) times daily. 06/19/19  Yes Kathrynn Ducking, MD  linaclotide [facility]) 290 MCG CAPS capsule Take 1 capsule (290 mcg total) by mouth as needed. 06/29/20  Yes Milus Banister, MD  polyethylene glycol powder [facility]) 17 GM/SCOOP powder Use 1 scoop daily 06/30/20  Yes Milus Banister, MD  Blood Pressure KIT Check blood pressures 2-3 times weekly 07/06/20   Colon Branch, MD    ROS:  Out of a complete 14 system review of symptoms, the patient complains only of the following symptoms, and all other reviewed systems are negative.  Tremor  Blood pressure (!) 156/75, pulse 83, height '5\' 8"'  (1.727 m), weight 154 lb 9.6 oz (70.1 kg).  Physical Exam  General: The patient is alert and cooperative at the time of the examination.  Skin: No significant peripheral edema is noted.   Neurologic Exam  Mental status: The patient is alert and oriented x 3 at the time of the examination. The patient has apparent normal recent and remote memory, with an apparently normal attention span and concentration ability.   Cranial nerves: Facial symmetry is present. Speech is normal, no aphasia or dysarthria is noted. Extraocular movements are full. Visual fields are full.  Mild masking the face is seen, some decrease in eye blink frequency is noted.  Motor: The patient has good strength in all 4 extremities.  Sensory examination: Soft touch sensation is symmetric on the face, arms, and legs.  Coordination: The patient has good finger-nose-finger and heel-to-shin bilaterally.  A resting tremor is noted on the right upper extremity primarily, occasionally seen on the left.  When drawing a spiral, no tremor is translated into the handwriting.  Gait and station: The patient has a normal gait. Tandem gait is normal. Romberg is negative. No drift is seen.  Reflexes: Deep tendon reflexes are symmetric.   Assessment/Plan:  1.  History of seizures, well controlled  2.  Resting tremor, possible parkinsonism  The patient will continue to be  followed for his resting tremor.  He may very well have Parkinson's disease, but this cannot be diagnosed yet on clinical examination.  He will continue the Lamictal, prescription was sent in.  Blood work will be done today.  He will follow-up in 6 months.  Jill Alexanders MD 07/15/2020 8:59 AM  [facility] [address] [place], [place] [address]  Phone [phone] Fax [phone]

## 2020-07-16 LAB — LAMOTRIGINE LEVEL: Lamotrigine Lvl: 7.8 ug/mL (ref 2.0–20.0)

## 2020-07-28 DIAGNOSIS — R972 Elevated prostate specific antigen [PSA]: Secondary | ICD-10-CM | POA: Diagnosis not present

## 2020-07-28 LAB — PSA: PSA: 4.71

## 2020-08-12 DIAGNOSIS — R102 Pelvic and perineal pain: Secondary | ICD-10-CM | POA: Diagnosis not present

## 2020-10-03 ENCOUNTER — Encounter: Payer: Self-pay | Admitting: Internal Medicine

## 2020-11-02 ENCOUNTER — Encounter: Payer: Self-pay | Admitting: Internal Medicine

## 2020-11-02 ENCOUNTER — Ambulatory Visit: Payer: PPO

## 2020-11-02 ENCOUNTER — Other Ambulatory Visit (HOSPITAL_BASED_OUTPATIENT_CLINIC_OR_DEPARTMENT_OTHER): Payer: Self-pay

## 2020-11-02 MED ORDER — PFIZER-BIONT COVID-19 VAC-TRIS 30 MCG/0.3ML IM SUSP
INTRAMUSCULAR | 0 refills | Status: DC
  Filled 2020-11-02: qty 0.3, 1d supply, fill #0

## 2020-11-02 NOTE — Progress Notes (Signed)
   Covid-19 Vaccination Clinic  Name:  JAIME GRIZZELL    MRN: [id number] DOB: 11-Oct-1950  11/02/2020  Mr. Hallam was observed post Covid-19 immunization for 15 minutes without incident. He was provided with Vaccine Information Sheet and instruction to access the V-Safe system.   Mr. Flamenco was instructed to call 911 with any severe reactions post vaccine: Marland Kitchen Difficulty breathing  . Swelling of face and throat  . A fast heartbeat  . A bad rash all over body  . Dizziness and weakness

## 2020-11-04 ENCOUNTER — Ambulatory Visit: Payer: PPO | Admitting: Internal Medicine

## 2020-11-09 ENCOUNTER — Encounter: Payer: Self-pay | Admitting: Family Medicine

## 2020-11-09 ENCOUNTER — Ambulatory Visit: Payer: Self-pay

## 2020-11-09 ENCOUNTER — Ambulatory Visit (INDEPENDENT_AMBULATORY_CARE_PROVIDER_SITE_OTHER): Payer: PPO | Admitting: Family Medicine

## 2020-11-09 ENCOUNTER — Other Ambulatory Visit: Payer: Self-pay

## 2020-11-09 DIAGNOSIS — M5442 Lumbago with sciatica, left side: Secondary | ICD-10-CM

## 2020-11-09 DIAGNOSIS — R972 Elevated prostate specific antigen [PSA]: Secondary | ICD-10-CM | POA: Diagnosis not present

## 2020-11-09 LAB — PSA: PSA: 5.31

## 2020-11-09 NOTE — Progress Notes (Signed)
Office Visit Note   Patient: Hunter Vega           Date of Birth: 12/08/1950           MRN: [id number] Visit Date: 11/09/2020 Requested by: Hunter Vega, Hunter V[address] [place],  [place] [address] PCP: Hunter Branch, MD  Subjective: Chief Complaint  Patient presents with   Lower Back - Pain    Pain in the lower back x 2 months post lifting, twisting, shoveling while putting in an electric fence. The pain is 95% gone with PT/dry needling with Hunter Vega. He continues to have numbness/tingling down the front of the left leg to just below the knee (constant), with weakness in that leg. Notices the weakness with descending stairs and on the elliptical machine.     HPI: He is seen at the request of Hunter Vega for low back and left leg pain.  Symptoms started about 2 months ago while helping to install an electric fence on his daughter's property.  He did not have immediate pain, but later on had quite a bit of back pain with a numbness and tingling sensation down the front of his left leg to below the knee.  He began seeing Hunter Vega for physical therapy and dry needling and his pain has improved significantly but he continues to have numbness and tingling, as well as occasional weakness.  Denies any bowel or bladder dysfunction, fevers or chills.  He is not taking anything for his symptoms.              ROS:   All other systems were reviewed and are negative.  Objective: Vital Signs: There were no vitals taken for this visit.  Physical Exam:  General:  Alert and oriented, in no acute distress. Pulm:  Breathing unlabored. Psy:  Normal mood, congruent affect. Skin: No rash Low back: Minimal tenderness to palpation of the lumbosacral spine.  Straight leg raise is negative.  No pain with internal hip rotation.  Lower extremity strength is normal but his knee DTR is hypoactive compared to 2+ on the right and his Achilles DTRs are 2+ bilaterally.   Imaging: XR  Lumbar Spine 2-3 Views  Result Date: 11/09/2020 X-rays show moderate to severe degenerative disc disease at multiple levels.  There is asymmetric disc space narrowing on the right at L5-S1.  Sacralization of the L5 vertebra.  SI joints have degenerative change.  Hip joints show minimal DJD.  No sign of neoplasm or compression fracture.   Assessment & Plan: Left leg numbness and weakness, concerning for upper lumbar foraminal impingement -We will try McKenzie protocol at home for the next couple weeks.  If he is not noticing any improvement, then he will contact me to arrange MRI scan followed by epidural injection if indicated.     Procedures: No procedures performed        PMFS History: Patient Active Problem List   Diagnosis Date Noted   Sleep apnea    Seizures ([place])    Hyperlipidemia    Hx of exercise stress test    History of seizure    Constipation    Allergy    BCC (basal cell carcinoma of skin) 02/23/2020   PCP NOTES >>>>>>> 02/15/2020   Bilateral inguinal hernia s/p lap repair with mesh 09/26/2017 09/26/2017   Left leg numbness 11/22/2016   PSVT (paroxysmal supraventricular tachycardia) ([place]) 11/04/2015   CAD (coronary artery disease) 11/04/2015   Polypharmacy 12/18/2013  Partial epilepsy with impairment of consciousness ([place]) 12/18/2013   Annual physical exam 12/18/2013   Seizure disorder ([place]) 05/10/2011   Mixed sleep apnea 03/30/2011   Mixed hyperlipidemia 11/24/2009   Past Medical History:  Diagnosis Date   Allergy    mild seasonal   BCC (basal cell carcinoma of skin)    Bilateral inguinal hernia s/p lap repair with mesh 09/26/2017 09/26/2017   CAD (coronary artery disease) 11/04/2015   Constipation    started Linzess monday 08-12-2018    Encounter for long-term (current) use of medications 12/18/2013   History of seizure    reports no seizure since 2015    Hx of exercise stress test    reports  had done to eval since coronary artery stent was placed ; reports  test was "normal"    Hyperlipidemia    Left leg numbness 11/22/2016   Mixed hyperlipidemia 11/24/2009   Overview:  IMPRESSION: Check labs today.  Continue Pravachol 40 mg daily.   Mixed sleep apnea 03/30/2011   Partial epilepsy with impairment of consciousness ([place]) 12/18/2013   PCP NOTES >>>>>>> 02/15/2020   Polypharmacy 12/18/2013   PSVT (paroxysmal supraventricular tachycardia) ([place])    reports no knowledge of this    Seizure disorder ([place]) 05/10/2011   Seizures ([place])    last 2015   Sleep apnea    reports no longer has sicne losing weight     Family History  Problem Relation Age of Onset   Liver disease Mother    Heart attack Father    Diabetes Paternal Uncle    Hunter cancer Neg Hx    Esophageal cancer Neg Hx    Prostate cancer Neg Hx    Hunter polyps Neg Hx    Rectal cancer Neg Hx    Stomach cancer Neg Hx     Past Surgical History:  Procedure Laterality Date   CARDIAC CATHETERIZATION     COLONOSCOPY     CORONARY ANGIOPLASTY     CORONARY STENT PLACEMENT  2015   x2. Mid LAD   INGUINAL HERNIA REPAIR Bilateral 09/26/2017   Procedure: LAPAROSCOPIC BILATERAL INGUINAL HERNIA REPAIR;  Surgeon:  Boston, MD;  [facility];  Service: General;  Laterality: Bilateral;   INSERTION OF MESH Bilateral 09/26/2017   Procedure: INSERTION OF MESH;  Surgeon:  Boston, MD;  Location[facility];  Service: General;  Laterality: Bilateral;   Social History   Occupational History   Occupation: retired/retail-manager [employer]    Comment: retired  Tobacco Use   Smoking status: Never   Smokeless tobacco: Never  Vaping Use   Vaping Use: Never used  Substance and Sexual Activity   Alcohol use: No   Drug use: No   Sexual activity: Yes

## 2020-11-23 ENCOUNTER — Ambulatory Visit (INDEPENDENT_AMBULATORY_CARE_PROVIDER_SITE_OTHER): Payer: PPO

## 2020-11-23 VITALS — Ht 67.0 in | Wt 154.0 lb

## 2020-11-23 DIAGNOSIS — Z0000 Encounter for general adult medical examination without abnormal findings: Secondary | ICD-10-CM

## 2020-11-23 NOTE — Patient Instructions (Signed)
[name] , Thank you for taking time to complete your Medicare Wellness Visit. I appreciate your ongoing commitment to your health goals. Please review the following plan we discussed and let me know if I can assist you in the future.   Screening recommendations/referrals: Colonoscopy: Completed 08/16/2018-Due 08/15/2025 Recommended yearly ophthalmology/optometry visit for glaucoma screening and checkup Recommended yearly dental visit for hygiene and checkup  Vaccinations: Influenza vaccine: Up to date Pneumococcal vaccine: Up to date Tdap vaccine: Discuss with pharmacy Shingles vaccine: Discuss with pharmacy   Covid-19: Up to date  Advanced directives: Copy in chart  Conditions/risks identified: See problem list  Next appointment: Follow up in one year for your annual wellness visit.   Preventive Care 20 Years and Older, Male Preventive care refers to lifestyle choices and visits with your health care provider that can promote health and wellness. What does preventive care include? A yearly physical exam. This is also called an annual well check. Dental exams once or twice a year. Routine eye exams. Ask your health care provider how often you should have your eyes checked. Personal lifestyle choices, including: Daily care of your teeth and gums. Regular physical activity. Eating a healthy diet. Avoiding tobacco and drug use. Limiting alcohol use. Practicing safe sex. Taking low doses of aspirin every day. Taking vitamin and mineral supplements as recommended by your health care provider. What happens during an annual well check? The services and screenings done by your health care provider during your annual well check will depend on your age, overall health, lifestyle risk factors, and family history of disease. Counseling  Your health care provider may ask you questions about your: Alcohol use. Tobacco use. Drug use. Emotional well-being. Home and relationship  well-being. Sexual activity. Eating habits. History of falls. Memory and ability to understand (cognition). Work and work Statistician. Screening  You may have the following tests or measurements: Height, weight, and BMI. Blood pressure. Lipid and cholesterol levels. These may be checked every 5 years, or more frequently if you are over 19 years old. Skin check. Lung cancer screening. You may have this screening every year starting at age 15 if you have a 30-pack-year history of smoking and currently smoke or have quit within the past 15 years. Fecal occult blood test (FOBT) of the stool. You may have this test every year starting at age 61. Flexible sigmoidoscopy or colonoscopy. You may have a sigmoidoscopy every 5 years or a colonoscopy every 10 years starting at age 44. Prostate cancer screening. Recommendations will vary depending on your family history and other risks. Hepatitis C blood test. Hepatitis B blood test. Sexually transmitted disease (STD) testing. Diabetes screening. This is done by checking your blood sugar (glucose) after you have not eaten for a while (fasting). You may have this done every 1-3 years. Abdominal aortic aneurysm (AAA) screening. You may need this if you are a current or former smoker. Osteoporosis. You may be screened starting at age 76 if you are at high risk. Talk with your health care provider about your test results, treatment options, and if necessary, the need for more tests. Vaccines  Your health care provider may recommend certain vaccines, such as: Influenza vaccine. This is recommended every year. Tetanus, diphtheria, and acellular pertussis (Tdap, Td) vaccine. You may need a Td booster every 10 years. Zoster vaccine. You may need this after age 11. Pneumococcal 13-valent conjugate (PCV13) vaccine. One dose is recommended after age 89. Pneumococcal polysaccharide (PPSV23) vaccine. One dose is recommended  after age 92. Talk to your health care  provider about which screenings and vaccines you need and how often you need them. This information is not intended to replace advice given to you by your health care provider. Make sure you discuss any questions you have with your health care provider. Document Released: 06/11/2015 Document Revised: 02/02/2016 Document Reviewed: 03/16/2015 Elsevier Interactive Patient Education  2017 [place] Prevention in the Home Falls can cause injuries. They can happen to people of all ages. There are many things you can do to make your home safe and to help prevent falls. What can I do on the outside of my home? Regularly fix the edges of walkways and driveways and fix any cracks. Remove anything that might make you trip as you walk through a door, such as a raised step or threshold. Trim any bushes or trees on the path to your home. Use bright outdoor lighting. Clear any walking paths of anything that might make someone trip, such as rocks or tools. Regularly check to see if handrails are loose or broken. Make sure that both sides of any steps have handrails. Any raised decks and porches should have guardrails on the edges. Have any leaves, snow, or ice cleared regularly. Use sand or salt on walking paths during winter. Clean up any spills in your garage right away. This includes oil or grease spills. What can I do in the bathroom? Use night lights. Install grab bars by the toilet and in the tub and shower. Do not use towel bars as grab bars. Use non-skid mats or decals in the tub or shower. If you need to sit down in the shower, use a plastic, non-slip stool. Keep the floor dry. Clean up any water that spills on the floor as soon as it happens. Remove soap buildup in the tub or shower regularly. Attach bath mats securely with double-sided non-slip rug tape. Do not have throw rugs and other things on the floor that can make you trip. What can I do in the bedroom? Use night lights. Make  sure that you have a light by your bed that is easy to reach. Do not use any sheets or blankets that are too big for your bed. They should not hang down onto the floor. Have a firm chair that has side arms. You can use this for support while you get dressed. Do not have throw rugs and other things on the floor that can make you trip. What can I do in the kitchen? Clean up any spills right away. Avoid walking on wet floors. Keep items that you use a lot in easy-to-reach places. If you need to reach something above you, use a strong step stool that has a grab bar. Keep electrical cords out of the way. Do not use floor polish or wax that makes floors slippery. If you must use wax, use non-skid floor wax. Do not have throw rugs and other things on the floor that can make you trip. What can I do with my stairs? Do not leave any items on the stairs. Make sure that there are handrails on both sides of the stairs and use them. Fix handrails that are broken or loose. Make sure that handrails are as long as the stairways. Check any carpeting to make sure that it is firmly attached to the stairs. Fix any carpet that is loose or worn. Avoid having throw rugs at the top or bottom of the stairs. If you  do have throw rugs, attach them to the floor with carpet tape. Make sure that you have a light switch at the top of the stairs and the bottom of the stairs. If you do not have them, ask someone to add them for you. What else can I do to help prevent falls? Wear shoes that: Do not have high heels. Have rubber bottoms. Are comfortable and fit you well. Are closed at the toe. Do not wear sandals. If you use a stepladder: Make sure that it is fully opened. Do not climb a closed stepladder. Make sure that both sides of the stepladder are locked into place. Ask someone to hold it for you, if possible. Clearly mark and make sure that you can see: Any grab bars or handrails. First and last steps. Where the  edge of each step is. Use tools that help you move around (mobility aids) if they are needed. These include: Canes. Walkers. Scooters. Crutches. Turn on the lights when you go into a dark area. Replace any light bulbs as soon as they burn out. Set up your furniture so you have a clear path. Avoid moving your furniture around. If any of your floors are uneven, fix them. If there are any pets around you, be aware of where they are. Review your medicines with your doctor. Some medicines can make you feel dizzy. This can increase your chance of falling. Ask your doctor what other things that you can do to help prevent falls. This information is not intended to replace advice given to you by your health care provider. Make sure you discuss any questions you have with your health care provider. Document Released: 03/11/2009 Document Revised: 10/21/2015 Document Reviewed: 06/19/2014 Elsevier Interactive Patient Education  2017 [employer].

## 2020-11-23 NOTE — Progress Notes (Signed)
Subjective:   Hunter Vega is a 70 y.o. male who presents for Medicare Annual/Subsequent preventive examination.  I connected with Jemario today by telephone and verified that I am speaking with the correct person using two identifiers. Location patient: home Location provider: work Persons participating in the virtual visit: patient, Marine scientist.    I discussed the limitations, risks, security and privacy concerns of performing an evaluation and management service by telephone and the availability of in person appointments. I also discussed with the patient that there may be a patient responsible charge related to this service. The patient expressed understanding and verbally consented to this telephonic visit.    Interactive audio and video telecommunications were attempted between this provider and patient, however failed, due to patient having technical difficulties OR patient did not have access to video capability.  We continued and completed visit with audio only.  Some vital signs may be absent or patient reported.   Time Spent with patient on telephone encounter: 20 minutes   Review of Systems     Cardiac Risk Factors include: advanced age (>47mn, >>43women);male gender;dyslipidemia     Objective:    Today's Vitals   11/23/20 0859  Weight: 154 lb (69.9 kg)  Height: _0  (1.702 m)   Body mass index is 24.12 kg/m.  Advanced Directives 11/23/2020 10/14/2019 08/16/2018 08/14/2018 09/18/2017 05/24/2017 11/23/2016  Does Patient Have a Medical Advance Directive? _1  Yes Yes  Type of AParamedicof AFranquezLiving will HRockfordLiving will Living will H[place]landLiving will Living will HPanorama ParkLiving will -  Does patient want to make changes to medical advance directive? - No - Patient declined No - Patient declined - No - Patient declined - No - Patient declined  Copy of [place] Chart? Yes - validated most recent copy scanned in chart (See row information) No - copy requested - - - No - copy requested -    Current Medications (verified) Outpatient Encounter Medications as of 11/23/2020  Medication Sig   aspirin EC 81 MG tablet Take 81 mg by mouth daily.    atorvastatin (LIPITOR) 80 MG tablet Take 1 tablet (80 mg total) by mouth at bedtime.   Blood Pressure KIT Check blood pressures 2-3 times weekly   lamoTRIgine (LAMICTAL) 150 MG tablet Take 1 tablet (150 mg total) by mouth 2 (two) times daily.   polyethylene glycol powder (GLYCOLAX/MIRALAX) 17 GM/SCOOP powder Use 1 scoop daily   [DISCONTINUED] COVID-19 mRNA Vac-TriS, Pfizer, (PFIZER-BIONT COVID-19 VAC-TRIS) SUSP injection Inject into the muscle.   No facility-administered encounter medications on file as of 11/23/2020.    Allergies (verified) Penicillins   History: Past Medical History:  Diagnosis Date   Allergy    mild seasonal   BCC (basal cell carcinoma of skin)    Bilateral inguinal hernia s/p lap repair with mesh 09/26/2017 09/26/2017   CAD (coronary artery disease) 11/04/2015   Constipation    started Linzess monday 08-12-2018    Encounter for long-term (current) use of medications 12/18/2013   History of seizure    reports no seizure since 2015    Hx of exercise stress test    reports  had done to eval since coronary artery stent was placed ; reports test was "normal"    Hyperlipidemia    Left leg numbness 11/22/2016   Mixed hyperlipidemia 11/24/2009   Overview:  IMPRESSION: Check labs today.  Continue  Pravachol 40 mg daily.   Mixed sleep apnea 03/30/2011   Partial epilepsy with impairment of consciousness ([place]) 12/18/2013   PCP NOTES >>>>>>> 02/15/2020   Polypharmacy 12/18/2013   PSVT (paroxysmal supraventricular tachycardia) ([place])    reports no knowledge of this    Seizure disorder ([place]) 05/10/2011   Seizures ([place])    last 2015   Sleep apnea    reports no longer has sicne losing  weight    Past Surgical History:  Procedure Laterality Date   CARDIAC CATHETERIZATION     COLONOSCOPY     CORONARY ANGIOPLASTY     CORONARY STENT PLACEMENT  2015   x2. Mid LAD   INGUINAL HERNIA REPAIR Bilateral 09/26/2017   Procedure: LAPAROSCOPIC BILATERAL INGUINAL HERNIA REPAIR;  Surgeon: Michael Boston, MD;  Location: [facility];  Service: General;  Laterality: Bilateral;   INSERTION OF MESH Bilateral 09/26/2017   Procedure: INSERTION OF MESH;  Surgeon: Michael Boston, MD;  Location: [facility];  Service: General;  Laterality: Bilateral;   Family History  Problem Relation Age of Onset   Liver disease Mother    Heart attack Father    Diabetes Paternal Uncle    Colon cancer Neg Hx    Esophageal cancer Neg Hx    Prostate cancer Neg Hx    Colon polyps Neg Hx    Rectal cancer Neg Hx    Stomach cancer Neg Hx    Social History   Socioeconomic History   Marital status: Married    Spouse name: Not on file   Number of children: 3   Years of education: Not on file   Highest education level: Not on file  Occupational History   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  Tobacco Use   Smoking status: Never   Smokeless tobacco: Never  Vaping Use   Vaping Use: Never used  Substance and Sexual Activity   Alcohol use: No   Drug use: No   Sexual activity: Yes  Other Topics Concern   Not on file  Social History Narrative   Lives with wife   Caffeine use: 1 cup corfee daily   Left handed    Social Determinants of Health   Financial Resource Strain: Low Risk    Difficulty of Paying Living Expenses: Not hard at all  Food Insecurity: No Food Insecurity   Worried About Charity fundraiser in the Last Year: Never true   [place] in the Last Year: Never true  Transportation Needs: No Transportation Needs   Lack of Transportation (Medical): No   Lack of Transportation (Non-Medical): No  Physical Activity: Sufficiently Active   Days of Exercise per Week: 7 days    Minutes of Exercise per Session: 30 min  Stress: No Stress Concern Present   Feeling of Stress : Not at all  Social Connections: Moderately Integrated   Frequency of Communication with Friends and Family: More than three times a week   Frequency of Social Gatherings with Friends and Family: More than three times a week   Attends Religious Services: Never   Marine scientist or Organizations: Yes   Attends Archivist Meetings: 1 to 4 times per year   Marital Status: Married    Tobacco Counseling Counseling given: Not Answered   Clinical Intake:  Pre-visit preparation completed: Yes  Pain : No/denies pain     Nutritional Status: BMI of 19-24  Normal Nutritional Risks: None Diabetes: No  How often do  you need to have someone help you when you read instructions, pamphlets, or other written materials from your doctor or pharmacy?: 1 - Never  Diabetic?No  Interpreter Needed?: No  Information entered by :: Caroleen Hamman LPN   Activities of Daily Living In your present state of health, do you have any difficulty performing the following activities: 11/23/2020 02/12/2020  Hearing? N N  Vision? N N  Difficulty concentrating or making decisions? N N  Walking or climbing stairs? N N  Dressing or bathing? N N  Doing errands, shopping? N N  Preparing Food and eating ? N -  Using the Toilet? N -  In the past six months, have you accidently leaked urine? N -  Do you have problems with loss of bowel control? N -  Managing your Medications? N -  Managing your Finances? N -  Housekeeping or managing your Housekeeping? N -  Some recent data might be hidden    Patient Care Team: Colon Branch, MD as PCP - General (Internal Medicine) Michael Boston, MD as Consulting Physician (General Surgery) Kathrynn Ducking, MD as Consulting Physician (Neurology) Richardo Priest, MD as Consulting Physician (Cardiology) Harriett Sine, MD as Consulting Physician  (Dermatology) Franchot Gallo, MD as Consulting Physician (Urology)  Indicate any recent [place] you may have received from other than [facility] providers in the past year (date may be approximate).     Assessment:   This is a routine wellness examination for Shmuel.  Hearing/Vision screen Hearing Screening - Comments:: No issues Vision Screening - Comments:: Wears glasses Last eye exam-06/2020-Dr. 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 0 0 0 0 0  Comment - - - - Emmi Telephone Survey: data to providers prior to load  Number falls in past yr: 0 0 0 0 -  Injury with Fall? 0 0 0 0 -  Follow up Falls prevention discussed - Falls evaluation completed Education provided;Falls prevention discussed -    FALL RISK PREVENTION PERTAINING TO THE HOME:  Any stairs in or around the home? Yes  If so, are there any without handrails? No  Home free of loose throw rugs in walkways, pet beds, electrical cords, etc? Yes  Adequate lighting in your home to reduce risk of falls? Yes   ASSISTIVE DEVICES UTILIZED TO PREVENT FALLS:  Life alert? No  Use of a cane, walker or w/c? No  Grab bars in the bathroom? No  Shower chair or bench in shower? No  Elevated toilet seat or a handicapped toilet? No   TIMED UP AND GO:  Was the test performed? No . Phone visit   Cognitive Function:Normal  cognitive status assessed by this Nurse Health Advisor. No abnormalities found.          Immunizations Immunization History  Administered Date(s) Administered  Influenza, High Dose Seasonal PF 03/22/2017, 03/26/2018, 02/06/2019   Influenza-Unspecified 03/10/2019, 03/21/2020   PFIZER Comirnaty(Gray Top)Covid-19 Tri-Sucrose Vaccine 11/02/2020   PFIZER(Purple Top)SARS-COV-2 Vaccination 06/17/2019, 07/08/2019, 02/20/2020   Pneumococcal Conjugate-13 03/26/2018   Pneumococcal Polysaccharide-23 06/29/2020   Td 05/29/2009   Zoster, Live 01/02/2014    TDAP status: Due, Education has been provided regarding the importance of this vaccine. Advised may receive this vaccine at local pharmacy or Health Dept. Aware to provide a copy of the vaccination record if obtained from local pharmacy or Health Dept. Verbalized acceptance and understanding.  Flu Vaccine status: Up to date  Pneumococcal vaccine status: Up to date  Covid-19 vaccine status: Completed vaccines  Qualifies for Shingles Vaccine? Yes   Zostavax completed Yes   Shingrix Completed?: No.    Education has been provided regarding the importance of this vaccine. Patient has been advised to call insurance company to determine out of pocket expense if they have not yet received this vaccine. Advised may also receive vaccine at local pharmacy or Health Dept. Verbalized acceptance and understanding.  Screening Tests Health Maintenance  Topic Date Due   Zoster Vaccines- Shingrix (1 of 2) Never done   TETANUS/TDAP  06/29/2021 (Originally 05/30/2019)   Hepatitis C Screening  05/29/2048 (Originally 01/22/1969)   INFLUENZA VACCINE  12/27/2020   COVID-19 Vaccine (5 - Booster for Pfizer series) 03/04/2021   COLONOSCOPY (Pts 45-51yr Insurance coverage will need to be confirmed)  08/15/2025   PNA vac Low Risk Adult  Completed   HPV VACCINES  Aged Out    Health Maintenance  Health Maintenance Due  Topic Date Due   Zoster Vaccines- Shingrix  (1 of 2) Never done    Colorectal cancer screening: Type of screening: Colonoscopy. Completed 08/16/2018. Repeat every 7 years  Lung Cancer Screening: (Low Dose CT Chest recommended if Age 72-80years, 30 pack-year currently smoking OR have quit w/in 15years.) does not qualify.     Additional Screening:  Hepatitis C Screening: does qualify; Previously declined  Vision Screening: Recommended annual ophthalmology exams for early detection of glaucoma and other disorders of the eye. Is the patient up to date with their annual eye exam?  Yes  Who is the provider or what is the name of the office in which the patient attends annual eye exams? Dr. HLaban Emperor  Dental Screening: Recommended annual dental exams for proper oral hygiene  Community Resource Referral / Chronic Care Management: CRR required this visit?  No   CCM required this visit?  No      Plan:     I have personally reviewed and noted the following in the patient's chart:   Medical and social history Use of alcohol, tobacco or illicit drugs  Current medications and supplements including opioid prescriptions. Patient is not currently taking opioid prescriptions. Functional ability and status Nutritional status Physical activity Advanced directives List of other physicians Hospitalizations, surgeries, and ER visits in previous 12 months Vitals Screenings to include cognitive, depression, and falls Referrals and appointments  In addition, I have reviewed and discussed with patient certain preventive protocols, quality metrics, and best practice recommendations. A written personalized care plan for preventive services as well as general preventive health recommendations were provided to patient.   Due to this being a telephonic visit, the after visit summary with patients personalized plan was offered to patient via mail or my-chart. Patient would like to access on my-chart.   MMarta Antu LPN   60/27/2536 Nurse  Health Advisor  Nurse Notes: None

## 2020-12-13 ENCOUNTER — Encounter: Payer: Self-pay | Admitting: Family Medicine

## 2020-12-13 ENCOUNTER — Other Ambulatory Visit: Payer: Self-pay

## 2020-12-13 ENCOUNTER — Ambulatory Visit (INDEPENDENT_AMBULATORY_CARE_PROVIDER_SITE_OTHER): Payer: PPO | Admitting: Family Medicine

## 2020-12-13 DIAGNOSIS — M5442 Lumbago with sciatica, left side: Secondary | ICD-10-CM

## 2020-12-13 DIAGNOSIS — R102 Pelvic and perineal pain: Secondary | ICD-10-CM | POA: Diagnosis not present

## 2020-12-13 DIAGNOSIS — R972 Elevated prostate specific antigen [PSA]: Secondary | ICD-10-CM | POA: Diagnosis not present

## 2020-12-13 NOTE — Progress Notes (Signed)
Office Visit Note   Patient: Hunter Vega           Date of Birth: 11-08-1950           MRN: [id number] Visit Date: 12/13/2020 Requested by: Colon Branch, [address] [place],  [place] [address] PCP: Colon Branch, MD  Subjective: Chief Complaint  Patient presents with   Lower Back - Pain, Follow-up    Numbness in the leg is still there, but not as severe. No pain in the back. The left leg is feeling weak - he noticed it with going down stairs. Will have pain in the distal anterior thigh with much walking (does not have to be as long a walk as a mile).    HPI: He is here for follow-up low back and left leg pain.  Since last visit his pain is gone doing Physiological scientist exercises at home.  He still has a sensation of numbness near the left anterior medial knee, and he does not feel he can fully trust his leg when going downstairs, but he is doing a lot of physical activity around the house and is pleased with his results as far as pain goes.              ROS:   All other systems were reviewed and are negative.  Objective: Vital Signs: There were no vitals taken for this visit.  Physical Exam:  General:  Alert and oriented, in no acute distress. Pulm:  Breathing unlabored. Psy:  Normal mood, congruent affect.  Left leg: He has 5/5 strength in the lower extremities bilaterally.  He has absent left patella DTR, 2+ on the right and 2+ bilateral Achilles DTRs.  Diminished light touch sensation on the anterior medial left knee.    Imaging: No results found.  Assessment & Plan: Improved left leg sciatica -Continue with McKenzie protocol at home for the next 4 to 6 weeks.  If the numbness persists, then he will contact me and I will order MRI scan lumbar spine.  Otherwise follow-up as needed.     Procedures: No procedures performed        PMFS History: Patient Active Problem List   Diagnosis Date Noted   Sleep apnea    Seizures ([place])    Hyperlipidemia     Hx of exercise stress test    History of seizure    Constipation    Allergy    BCC (basal cell carcinoma of skin) 02/23/2020   PCP NOTES >>>>>>> 02/15/2020   Bilateral inguinal hernia s/p lap repair with mesh 09/26/2017 09/26/2017   Left leg numbness 11/22/2016   PSVT (paroxysmal supraventricular tachycardia) ([place]) 11/04/2015   CAD (coronary artery disease) 11/04/2015   Polypharmacy 12/18/2013   Partial epilepsy with impairment of consciousness ([place]) 12/18/2013   Annual physical exam 12/18/2013   Seizure disorder ([place]) 05/10/2011   Mixed sleep apnea 03/30/2011   Mixed hyperlipidemia 11/24/2009   Past Medical History:  Diagnosis Date   Allergy    mild seasonal   BCC (basal cell carcinoma of skin)    Bilateral inguinal hernia s/p lap repair with mesh 09/26/2017 09/26/2017   CAD (coronary artery disease) 11/04/2015   Constipation    started Linzess monday 08-12-2018    Encounter for long-term (current) use of medications 12/18/2013   History of seizure    reports no seizure since 2015    Hx of exercise stress test    reports  had done to eval since coronary artery stent was placed ; reports test was "normal"    Hyperlipidemia    Left leg numbness 11/22/2016   Mixed hyperlipidemia 11/24/2009   Overview:  IMPRESSION: Check labs today.  Continue Pravachol 40 mg daily.   Mixed sleep apnea 03/30/2011   Partial epilepsy with impairment of consciousness ([place]) 12/18/2013   PCP NOTES >>>>>>> 02/15/2020   Polypharmacy 12/18/2013   PSVT (paroxysmal supraventricular tachycardia) ([place])    reports no knowledge of this    Seizure disorder ([place]) 05/10/2011   Seizures ([place])    last 2015   Sleep apnea    reports no longer has sicne losing weight     Family History  Problem Relation Age of Onset   Liver disease Mother    Heart attack Father    Diabetes Paternal Uncle    Colon cancer Neg Hx    Esophageal cancer Neg Hx    Prostate cancer Neg Hx    Colon polyps Neg Hx    Rectal cancer Neg Hx     Stomach cancer Neg Hx     Past Surgical History:  Procedure Laterality Date   CARDIAC CATHETERIZATION     COLONOSCOPY     CORONARY ANGIOPLASTY     CORONARY STENT PLACEMENT  2015   x2. Mid LAD   INGUINAL HERNIA REPAIR Bilateral 09/26/2017   Procedure: LAPAROSCOPIC BILATERAL INGUINAL HERNIA REPAIR;  Surgeon:  Boston, MD;  Location: [facility];  Service: General;  Laterality: Bilateral;   INSERTION OF MESH Bilateral 09/26/2017   Procedure: INSERTION OF MESH;  Surgeon:  Boston, MD;  Location: [facility];  Service: General;  Laterality: Bilateral;   Social History   Occupational History   Occupation: retired/retail-manager [employer]    Comment: retired  Tobacco Use   Smoking status: Never   Smokeless tobacco: Never  Vaping Use   Vaping Use: Never used  Substance and Sexual Activity   Alcohol use: No   Drug use: No   Sexual activity: Yes

## 2020-12-20 ENCOUNTER — Telehealth: Payer: Self-pay | Admitting: Neurology

## 2020-12-20 NOTE — Telephone Encounter (Signed)
Pt's wife(on DPR) is asking for a call to discuss having pt transferred to Dr Leta Baptist based on what he specializes in and what Dr Jannifer [place] is seeing him for.

## 2020-12-20 NOTE — Telephone Encounter (Signed)
Returned wife's call, she stated she would like to go ahead and get care established with Dr.Penumalli and also to get second opinion on parkinson's. Are you ok with cancelling his upcoming appt with you 02/08/21 and rescheduling this pt to see Dr.Penumalli ?

## 2020-12-20 NOTE — Telephone Encounter (Signed)
Spoke with wife again, she decided to keep the appt with Dr.Willis and to be scheduled with Dr.Penumalli at next visit after.

## 2020-12-21 NOTE — Telephone Encounter (Signed)
Pt's wife has called back asking that I let Marcille Blanco know that 1st she was very helpful. She wants Marcille Blanco to know that after speaking it came to her that she should ask if even with keeping pt's upcoming appointment with Dr Jannifer [place] can they go ahead and be proactive in scheduling pt with Dr Leta Baptist the week of 10-17, please call

## 2020-12-21 NOTE — Telephone Encounter (Signed)
I called the pt's wife back and we discussed. Tentatively we have scheduled for October 18th with Dr. Leta Baptist. Pt will keep f/u as scheduled with Dr. Jannifer [place] 02/08/2021, and will possibly move the appt with Dr. Leta Baptist out depending on how the visit with Dr. Jannifer [place] goes. Wife verbalized appreciation for the call.

## 2020-12-28 ENCOUNTER — Other Ambulatory Visit: Payer: Self-pay

## 2020-12-28 ENCOUNTER — Ambulatory Visit (INDEPENDENT_AMBULATORY_CARE_PROVIDER_SITE_OTHER): Payer: PPO | Admitting: Internal Medicine

## 2020-12-28 ENCOUNTER — Encounter: Payer: Self-pay | Admitting: Internal Medicine

## 2020-12-28 VITALS — BP 138/68 | HR 46 | Temp 98.0°F | Resp 16 | Ht 67.0 in | Wt 153.0 lb

## 2020-12-28 DIAGNOSIS — Z7185 Encounter for immunization safety counseling: Secondary | ICD-10-CM | POA: Diagnosis not present

## 2020-12-28 DIAGNOSIS — K5900 Constipation, unspecified: Secondary | ICD-10-CM | POA: Diagnosis not present

## 2020-12-28 DIAGNOSIS — E785 Hyperlipidemia, unspecified: Secondary | ICD-10-CM | POA: Diagnosis not present

## 2020-12-28 DIAGNOSIS — D649 Anemia, unspecified: Secondary | ICD-10-CM | POA: Diagnosis not present

## 2020-12-28 DIAGNOSIS — R739 Hyperglycemia, unspecified: Secondary | ICD-10-CM | POA: Diagnosis not present

## 2020-12-28 NOTE — Patient Instructions (Addendum)
Immunizations I recommend that you complete things: TDAP or Boostrix (tetanus) booster. You may do this today downstairs at the pharmacy if you would like. Shingrix A flu shot this fall    Check the  blood pressure  BP GOAL is between 110/65 and  135/85. If it is consistently higher or lower, let me know    [place], [place] back for blood work, fasting within few days.     Come back for a physical exam in 6 months

## 2020-12-28 NOTE — Progress Notes (Signed)
Subjective:    Patient ID: Hunter Vega, male    DOB: November 05, 1950, 70 y.o.   MRN: [id number]  DOS:  12/28/2020 Type of visit - description: f/u  Today with talk about CAD, blood pressure, elevated PSA, Parkinson and constipation.  In general feels well.  He still have some issues with constipation on and off  Review of Systems  no chest pain or difficulty breathing No nausea or vomiting.  No blood in the stools  Past Medical History:  Diagnosis Date   Allergy    mild seasonal   BCC (basal cell carcinoma of skin)    Bilateral inguinal hernia s/p lap repair with mesh 09/26/2017 09/26/2017   CAD (coronary artery disease) 11/04/2015   Constipation    started Linzess monday 08-12-2018    Encounter for long-term (current) use of medications 12/18/2013   History of seizure    reports no seizure since 2015    Hx of exercise stress test    reports  had done to eval since coronary artery stent was placed ; reports test was "normal"    Hyperlipidemia    Left leg numbness 11/22/2016   Mixed hyperlipidemia 11/24/2009   Overview:  IMPRESSION: Check labs today.  Continue Pravachol 40 mg daily.   Mixed sleep apnea 03/30/2011   Partial epilepsy with impairment of consciousness ([place]) 12/18/2013   PCP NOTES >>>>>>> 02/15/2020   Polypharmacy 12/18/2013   PSVT (paroxysmal supraventricular tachycardia) ([place])    reports no knowledge of this    Seizure disorder ([place]) 05/10/2011   Seizures ([place])    last 2015   Sleep apnea    reports no longer has sicne losing weight     Past Surgical History:  Procedure Laterality Date   CARDIAC CATHETERIZATION     COLONOSCOPY     CORONARY ANGIOPLASTY     CORONARY STENT PLACEMENT  2015   x2. Mid LAD   INGUINAL HERNIA REPAIR Bilateral 09/26/2017   Procedure: LAPAROSCOPIC BILATERAL INGUINAL HERNIA REPAIR;  Surgeon: Michael Boston, MD;  Location: [facility];  Service: General;  Laterality: Bilateral;   INSERTION OF MESH Bilateral 09/26/2017   Procedure: INSERTION OF MESH;   Surgeon: Michael Boston, MD;  Location: [facility];  Service: General;  Laterality: Bilateral;    Allergies as of 12/28/2020       Reactions   Penicillins Swelling   Arm swelling Has patient had a PCN reaction causing immediate rash, facial/tongue/throat swelling, SOB or lightheadedness with hypotension: No Has patient had a PCN reaction causing severe rash involving mucus membranes or skin necrosis: No Has patient had a PCN reaction that required hospitalization: No Has patient had a PCN reaction occurring within the last 10 years: No If all of the above answers are "NO", then may proceed with Cephalosporin use.        Medication List        Accurate as of December 28, 2020 11:59 PM. If you have any questions, ask your nurse or doctor.          aspirin EC 81 MG tablet Take 81 mg by mouth daily.   atorvastatin 80 MG tablet Commonly known as: LIPITOR Take 1 tablet (80 mg total) by mouth at bedtime.   Blood Pressure Kit Check blood pressures 2-3 times weekly   lamoTRIgine 150 MG tablet Commonly known as: LAMICTAL Take 1 tablet (150 mg total) by mouth 2 (two) times daily.   polyethylene glycol powder 17 GM/SCOOP powder Commonly known as: GLYCOLAX/MIRALAX Use 1  scoop daily           Objective:   Physical Exam BP 138/68 (BP Location: Left Arm, Patient Position: Sitting, Cuff Size: Small)   Pulse (!) 46   Temp 98 F (36.7 C) (Oral)   Resp 16   Ht _0  (1.702 m)   Wt 153 lb (69.4 kg)   SpO2 93%   BMI 23.96 kg/m  General:   Well developed, NAD, BMI noted. HEENT:  Normocephalic . Face symmetric, atraumatic Lungs:  CTA B Normal respiratory effort, no intercostal retractions, no accessory muscle use. Heart: RRR,  no murmur.  Lower extremities: no pretibial edema bilaterally  Skin: Not pale. Not jaundice Neurologic:  alert & oriented X3.  Speech normal, gait appropriate for age and unassisted.  Upper extremity tremors noted Psych--  Cognition and judgment appear  intact.  Cooperative with normal attention span and concentration.  Behavior appropriate. No anxious or depressed appearing.      Assessment       Assessment Hyperglycemia. High cholesterol. CAD PCI  stent LAD 2014   PSVT H/o [facility] 2019 Neurology (Dr. Jannifer Franklin) -H/o Sz -Possible Parkinson's disease Chronic constipation (GI Dr [name])  PLAN Hyperglycemia: Last A1c was 5.9, explained patient he has prediabetes, concept of A1c discussed.  Recommend a healthy/well-balanced diet. Recheck A1c. High cholesterol: On Lipitor, checking labs Parkinson's: Per Dr. Jannifer Franklin, patient getting a second opinion at [facility] CAD: Asymptomatic. Anemia: Mild, iron and ferritin normal, check a CBC Constipation: On and off symptoms on MiraLAX as needed. Patient education:  -A1c concept, diet   -I also advised patient that if he ever is diagnosed with COVID there is treatment available within 5 days of the diagnosis. Preventive care: Recommend Tdap, Shingrix and flu shot at his convenience.  He had 4 COVID vaccinations. RTC 6 months    This visit occurred during the SARS-CoV-2 public health emergency.  Safety protocols were in place, including screening questions prior to the visit, additional usage of staff PPE, and extensive cleaning of exam room while observing appropriate contact time as indicated for disinfecting solutions.

## 2020-12-29 DIAGNOSIS — H2513 Age-related nuclear cataract, bilateral: Secondary | ICD-10-CM | POA: Diagnosis not present

## 2020-12-29 DIAGNOSIS — H40013 Open angle with borderline findings, low risk, bilateral: Secondary | ICD-10-CM | POA: Diagnosis not present

## 2020-12-29 DIAGNOSIS — H16223 Keratoconjunctivitis sicca, not specified as Sjogren's, bilateral: Secondary | ICD-10-CM | POA: Diagnosis not present

## 2020-12-29 NOTE — Assessment & Plan Note (Signed)
Hyperglycemia: Last A1c was 5.9, explained patient he has prediabetes, concept of A1c discussed.  Recommend a healthy/well-balanced diet. Recheck A1c. High cholesterol: On Lipitor, checking labs Parkinson's: Per Dr. Jannifer Franklin, patient getting a second opinion at [facility] CAD: Asymptomatic. Anemia: Mild, iron and ferritin normal, check a CBC Constipation: On and off symptoms on MiraLAX as needed. Patient education:  -A1c concept, diet   -I also advised patient that if he ever is diagnosed with COVID there is treatment available within 5 days of the diagnosis. Preventive care: Recommend Tdap, Shingrix and flu shot at his convenience.  He had 4 COVID vaccinations. RTC 6 months

## 2020-12-30 ENCOUNTER — Other Ambulatory Visit (INDEPENDENT_AMBULATORY_CARE_PROVIDER_SITE_OTHER): Payer: PPO

## 2020-12-30 ENCOUNTER — Encounter: Payer: Self-pay | Admitting: Internal Medicine

## 2020-12-30 ENCOUNTER — Other Ambulatory Visit: Payer: Self-pay

## 2020-12-30 DIAGNOSIS — D649 Anemia, unspecified: Secondary | ICD-10-CM

## 2020-12-30 DIAGNOSIS — R739 Hyperglycemia, unspecified: Secondary | ICD-10-CM | POA: Diagnosis not present

## 2020-12-30 DIAGNOSIS — E785 Hyperlipidemia, unspecified: Secondary | ICD-10-CM | POA: Diagnosis not present

## 2020-12-30 LAB — LIPID PANEL
Cholesterol: 164 mg/dL (ref 0–200)
HDL: 60 mg/dL (ref >39.00)
LDL Cholesterol: 83 mg/dL (ref 0–99)
NonHDL: 103.83
Total CHOL/HDL Ratio: 3
Triglycerides: 105 mg/dL (ref 0.0–149.0)
VLDL: 21 mg/dL (ref 0.0–40.0)

## 2020-12-30 LAB — CBC WITH DIFFERENTIAL/PLATELET
Basophils Absolute: 0.1 10*3/uL (ref 0.0–0.1)
Basophils Relative: 1 % (ref 0.0–3.0)
Eosinophils Absolute: 0.4 10*3/uL (ref 0.0–0.7)
Eosinophils Relative: 6.7 % — ABNORMAL HIGH (ref 0.0–5.0)
HCT: 39.7 % (ref 39.0–52.0)
Hemoglobin: 13.2 g/dL (ref 13.0–17.0)
Lymphocytes Relative: 27.4 % (ref 12.0–46.0)
Lymphs Abs: 1.4 10*3/uL (ref 0.7–4.0)
MCHC: 33.3 g/dL (ref 30.0–36.0)
MCV: 96.4 fl (ref 78.0–100.0)
Monocytes Absolute: 0.8 10*3/uL (ref 0.1–1.0)
Monocytes Relative: 15.6 % — ABNORMAL HIGH (ref 3.0–12.0)
Neutro Abs: 2.6 10*3/uL (ref 1.4–7.7)
Neutrophils Relative %: 49.3 % (ref 43.0–77.0)
Platelets: 163 10*3/uL (ref 150.0–400.0)
RBC: 4.12 Mil/uL — ABNORMAL LOW (ref 4.22–5.81)
RDW: 13.2 % (ref 11.5–15.5)
WBC: 5.3 10*3/uL (ref 4.0–10.5)

## 2020-12-30 LAB — AST: AST: 21 U/L (ref 0–37)

## 2020-12-30 LAB — ALT: ALT: 14 U/L (ref 0–53)

## 2020-12-30 LAB — HEMOGLOBIN A1C: Hgb A1c MFr Bld: 5.8 % (ref 4.6–6.5)

## 2020-12-31 MED ORDER — EZETIMIBE 10 MG PO TABS: 10.0000 mg | ORAL_TABLET | Freq: Every day | ORAL | 3 refills | Status: DC

## 2020-12-31 NOTE — Addendum Note (Signed)
Addended byDamita Dunnings D on: 12/31/2020 07:49 AM   Modules accepted: Orders

## 2021-01-07 DIAGNOSIS — H40003 Preglaucoma, unspecified, bilateral: Secondary | ICD-10-CM | POA: Diagnosis not present

## 2021-02-08 ENCOUNTER — Other Ambulatory Visit: Payer: Self-pay

## 2021-02-08 ENCOUNTER — Ambulatory Visit: Payer: PPO | Admitting: Neurology

## 2021-02-08 ENCOUNTER — Encounter: Payer: Self-pay | Admitting: Neurology

## 2021-02-08 VITALS — BP 134/72 | HR 75 | Ht 68.0 in | Wt 154.0 lb

## 2021-02-08 DIAGNOSIS — Z87898 Personal history of other specified conditions: Secondary | ICD-10-CM

## 2021-02-08 DIAGNOSIS — G20A1 Parkinson's disease without dyskinesia, without mention of fluctuations: Secondary | ICD-10-CM

## 2021-02-08 DIAGNOSIS — G20 Parkinson's disease: Secondary | ICD-10-CM | POA: Diagnosis not present

## 2021-02-08 DIAGNOSIS — G40209 Localization-related (focal) (partial) symptomatic epilepsy and epileptic syndromes with complex partial seizures, not intractable, without status epilepticus: Secondary | ICD-10-CM | POA: Diagnosis not present

## 2021-02-08 DIAGNOSIS — R413 Other amnesia: Secondary | ICD-10-CM | POA: Diagnosis not present

## 2021-02-08 HISTORY — DX: Parkinson's disease: G20

## 2021-02-08 HISTORY — DX: Parkinson's disease without dyskinesia, without mention of fluctuations: G20.A1

## 2021-02-08 NOTE — Progress Notes (Signed)
Reason for visit: Seizures, Parkinson's disease  Hunter Vega is an 70 y.o. male  History of present illness:  Hunter Vega is a 70 year old left-handed white male with a history of seizures that has been well controlled on Lamictal.  The patient unfortunately has developed a resting tremor involving the right upper extremity that has persisted over the last 6 to 8 months.  The patient reports some changes in handwriting, he is left-handed.  The patient has noted that his handwriting has become more sloppy but it is not getting small.  He has not had any problems with speech changes or drooling.  He does have a history of restless leg syndrome that he has had for many years.  He has not noted any postural changes but he has noted that his activities of daily living is slow down somewhat.  His wife indicates that he has become more forgetful.  The patient denies any vivid dreams at night or any problems talking in his sleep.  His tremor appears to be somewhat more prominent if he is cold.  The patient denies any changes in his sense of smell.  He returns to this office for further evaluation.  Past Medical History:  Diagnosis Date   Allergy    mild seasonal   BCC (basal cell carcinoma of skin)    Bilateral inguinal hernia s/p lap repair with mesh 09/26/2017 09/26/2017   CAD (coronary artery disease) 11/04/2015   Constipation    started Linzess monday 08-12-2018    Encounter for long-term (current) use of medications 12/18/2013   History of seizure    reports no seizure since 2015    Hx of exercise stress test    reports  had done to eval since coronary artery stent was placed ; reports test was "normal"    Hyperlipidemia    Left leg numbness 11/22/2016   Mixed hyperlipidemia 11/24/2009   Overview:  IMPRESSION: Check labs today.  Continue Pravachol 40 mg daily.   Mixed sleep apnea 03/30/2011   Partial epilepsy with impairment of consciousness ([place]) 12/18/2013   PCP NOTES >>>>>>> 02/15/2020    Polypharmacy 12/18/2013   PSVT (paroxysmal supraventricular tachycardia) ([place])    reports no knowledge of this    Seizure disorder ([place]) 05/10/2011   Seizures ([place])    last 2015   Sleep apnea    reports no longer has sicne losing weight     Past Surgical History:  Procedure Laterality Date   CARDIAC CATHETERIZATION     COLONOSCOPY     CORONARY ANGIOPLASTY     CORONARY STENT PLACEMENT  2015   x2. Mid LAD   INGUINAL HERNIA REPAIR Bilateral 09/26/2017   Procedure: LAPAROSCOPIC BILATERAL INGUINAL HERNIA REPAIR;  Surgeon: Michael Boston, MD;  Location: [facility];  Service: General;  Laterality: Bilateral;   INSERTION OF MESH Bilateral 09/26/2017   Procedure: INSERTION OF MESH;  Surgeon: Michael Boston, MD;  Location: [facility];  Service: General;  Laterality: Bilateral;    Family History  Problem Relation Age of Onset   Liver disease Mother    Heart attack Father    Diabetes Paternal Uncle    Colon cancer Neg Hx    Esophageal cancer Neg Hx    Prostate cancer Neg Hx    Colon polyps Neg Hx    Rectal cancer Neg Hx    Stomach cancer Neg Hx     Social history:  reports that he has never smoked. He has never used smokeless tobacco.  He reports that he does not drink alcohol and does not use drugs.    Allergies  Allergen Reactions   Penicillins Swelling    Arm swelling Has patient had a PCN reaction causing immediate rash, facial/tongue/throat swelling, SOB or lightheadedness with hypotension: No Has patient had a PCN reaction causing severe rash involving mucus membranes or skin necrosis: No Has patient had a PCN reaction that required hospitalization: No Has patient had a PCN reaction occurring within the last 10 years: No If all of the above answers are "NO", then may proceed with Cephalosporin use.     Medications:  Prior to Admission medications   Medication Sig Start Date End Date Taking? Authorizing Provider  aspirin EC 81 MG tablet Take 81 mg by mouth daily.     [provider]  atorvastatin (LIPITOR) 80 MG tablet Take 1 tablet (80 mg total) by mouth at bedtime. 01/07/20   Richardo Priest, MD  Blood Pressure KIT Check blood pressures 2-3 times weekly 07/06/20   Colon Branch, MD  ezetimibe (ZETIA) 10 MG tablet Take 1 tablet (10 mg total) by mouth daily. 12/31/20   Colon Branch, MD  lamoTRIgine (LAMICTAL) 150 MG tablet Take 1 tablet (150 mg total) by mouth 2 (two) times daily. 07/15/20   Kathrynn Ducking, MD  polyethylene glycol powder [facility]) 17 GM/SCOOP powder Use 1 scoop daily 06/30/20   Milus Banister, MD    ROS:  Out of a complete 14 system review of symptoms, the patient complains only of the following symptoms, and all other reviewed systems are negative.  Tremor Mild memory changes History of seizures  Blood pressure 134/72, pulse 75, height _0  (1.727 m), weight 154 lb (69.9 kg), SpO2 99 %.  Physical Exam  General: The patient is alert and cooperative at the time of the examination.  Skin: No significant peripheral edema is noted.   Neurologic Exam  Mental status: The patient is alert and oriented x 3 at the time of the examination. The patient has apparent normal recent and remote memory, with an apparently normal attention span and concentration ability.  The total score on the [facility] evaluation was 25/30.   Cranial nerves: Facial symmetry is present. Speech is normal, no aphasia or dysarthria is noted. Extraocular movements are full. Visual fields are full.  Masking the face is noted.  Motor: The patient has good strength in all 4 extremities.  Sensory examination: Soft touch sensation is symmetric on the face, arms, and legs.  Coordination: The patient has good finger-nose-finger and heel-to-shin bilaterally.  Resting tremors noted with the right upper extremity.  Gait and station: The patient has a normal gait.  The patient has good symmetric arm swing with walking, tremor is noted with the right arm while walking.   Tandem gait is normal. Romberg is negative. No drift is seen.  Reflexes: Deep tendon reflexes are symmetric, with exception that the left knee jerk reflex is depressed.   Assessment/Plan:  1.  Right upper extremity resting tremor, probable Parkinson's disease  2.  History of seizures, well controlled  The patient will continue Lamictal at this point.  We discussed possibly starting Requip or Mirapex at this point, the patient has excellent mobility however and we will hold off on initiation of medication.  He will follow-up in 2 to 3 months with Dr. Leta Baptist.  He will be seeing Dr. Deboraha Sprang at [facility] on 28 June 2021.  The patient reports being somewhat forgetful, we  will follow memory issues over time, we will check blood work today.  Jill Alexanders MD 02/08/2021 9:41 AM  [facility] [address] [place], [place] [address]  Phone [phone] Fax [phone]

## 2021-02-09 ENCOUNTER — Other Ambulatory Visit (HOSPITAL_BASED_OUTPATIENT_CLINIC_OR_DEPARTMENT_OTHER): Payer: Self-pay

## 2021-02-09 LAB — SEDIMENTATION RATE: Sed Rate: 3 mm/hr (ref 0–30)

## 2021-02-09 LAB — SYPHILIS: RPR W/REFLEX TO RPR TITER AND TREPONEMAL ANTIBODIES, TRADITIONAL SCREENING AND DIAGNOSIS ALGORITHM: RPR Ser Ql: NONREACTIVE

## 2021-02-09 LAB — VITAMIN B12: Vitamin B-12: 550 pg/mL (ref 232–1245)

## 2021-02-10 DIAGNOSIS — D1801 Hemangioma of skin and subcutaneous tissue: Secondary | ICD-10-CM | POA: Diagnosis not present

## 2021-02-10 DIAGNOSIS — S80862A Insect bite (nonvenomous), left lower leg, initial encounter: Secondary | ICD-10-CM | POA: Diagnosis not present

## 2021-02-10 DIAGNOSIS — L570 Actinic keratosis: Secondary | ICD-10-CM | POA: Diagnosis not present

## 2021-02-10 DIAGNOSIS — L814 Other melanin hyperpigmentation: Secondary | ICD-10-CM | POA: Diagnosis not present

## 2021-02-10 DIAGNOSIS — L821 Other seborrheic keratosis: Secondary | ICD-10-CM | POA: Diagnosis not present

## 2021-02-10 DIAGNOSIS — L820 Inflamed seborrheic keratosis: Secondary | ICD-10-CM | POA: Diagnosis not present

## 2021-02-10 DIAGNOSIS — Z85828 Personal history of other malignant neoplasm of skin: Secondary | ICD-10-CM | POA: Diagnosis not present

## 2021-02-10 DIAGNOSIS — D225 Melanocytic nevi of trunk: Secondary | ICD-10-CM | POA: Diagnosis not present

## 2021-02-13 ENCOUNTER — Other Ambulatory Visit: Payer: Self-pay | Admitting: Cardiology

## 2021-02-15 ENCOUNTER — Telehealth: Payer: Self-pay | Admitting: Cardiology

## 2021-02-15 ENCOUNTER — Ambulatory Visit: Payer: PPO | Attending: Internal Medicine

## 2021-02-15 DIAGNOSIS — Z23 Encounter for immunization: Secondary | ICD-10-CM

## 2021-02-15 MED ORDER — ATORVASTATIN CALCIUM 80 MG PO TABS: 80.0000 mg | ORAL_TABLET | Freq: Every day | ORAL | 0 refills | Status: DC

## 2021-02-15 NOTE — Telephone Encounter (Signed)
*  STAT* If patient is at the pharmacy, call can be transferred to refill team.   1. Which medications need to be refilled? (please list name of each medication and dose if known) atorvastatin (LIPITOR) 80 MG tablet  2. Which pharmacy/location (including street and city if local pharmacy) is medication to be sent to? [facility] - [place], [place] - [address]  3. Do they need a 30 day or 90 day supply? 16  Patient wife called in to say that they didn't pick up the prescription today but it was only 30  days. She stated that they need a enough to last to his next appt on 12/15. /they are asking for a 90 days supple.  Please advise

## 2021-02-15 NOTE — Progress Notes (Signed)
   Covid-19 Vaccination Clinic  Name:  MORONI NESTER    MRN: [id number] DOB: January 11, 1951  02/15/2021  Mr. Kesling was observed post Covid-19 immunization for 15 minutes without incident. He was provided with Vaccine Information Sheet and instruction to access the V-Safe system.   Mr. Nix was instructed to call 911 with any severe reactions post vaccine: Difficulty breathing  Swelling of face and throat  A fast heartbeat  A bad rash all over body  Dizziness and weakness

## 2021-02-15 NOTE — Telephone Encounter (Signed)
Refill sent in per request.  

## 2021-02-22 ENCOUNTER — Other Ambulatory Visit (HOSPITAL_BASED_OUTPATIENT_CLINIC_OR_DEPARTMENT_OTHER): Payer: Self-pay

## 2021-02-22 MED ORDER — COVID-19MRNA BIVAL VACC PFIZER 30 MCG/0.3ML IM SUSP
INTRAMUSCULAR | 0 refills | Status: DC
  Filled 2021-02-22: qty 0.3, 1d supply, fill #0

## 2021-02-23 ENCOUNTER — Other Ambulatory Visit (HOSPITAL_BASED_OUTPATIENT_CLINIC_OR_DEPARTMENT_OTHER): Payer: Self-pay

## 2021-02-23 MED ORDER — INFLUENZA VAC A&B SA ADJ QUAD 0.5 ML IM PRSY
PREFILLED_SYRINGE | INTRAMUSCULAR | 0 refills | Status: DC
  Filled 2021-02-23: qty 0.5, 1d supply, fill #0

## 2021-03-15 ENCOUNTER — Ambulatory Visit: Payer: Self-pay | Admitting: Diagnostic Neuroimaging

## 2021-03-18 DIAGNOSIS — R972 Elevated prostate specific antigen [PSA]: Secondary | ICD-10-CM | POA: Diagnosis not present

## 2021-03-18 LAB — PSA: PSA: 5.47

## 2021-04-12 ENCOUNTER — Ambulatory Visit: Payer: PPO | Admitting: Diagnostic Neuroimaging

## 2021-04-12 ENCOUNTER — Encounter: Payer: Self-pay | Admitting: Diagnostic Neuroimaging

## 2021-04-12 VITALS — BP 144/74 | HR 73 | Ht 68.0 in | Wt 155.2 lb

## 2021-04-12 DIAGNOSIS — G20 Parkinson's disease: Secondary | ICD-10-CM

## 2021-04-12 DIAGNOSIS — G40909 Epilepsy, unspecified, not intractable, without status epilepticus: Secondary | ICD-10-CM | POA: Diagnosis not present

## 2021-04-12 MED ORDER — LAMOTRIGINE 150 MG PO TABS: 150.0000 mg | ORAL_TABLET | Freq: Two times a day (BID) | ORAL | 4 refills | Status: DC

## 2021-04-12 MED ORDER — LINACLOTIDE 290 MCG PO CAPS
290.0000 ug | ORAL_CAPSULE | Freq: Every day | ORAL | 4 refills | Status: DC
Start: 2021-04-12 — End: 2022-05-19

## 2021-04-12 NOTE — Progress Notes (Signed)
[facility]  PATIENT: Hunter Vega DOB: 03-10-1951  REFERRING CLINICIAN: Colon Branch, MD HISTORY FROM: patient  REASON FOR VISIT: new consult    HISTORICAL  CHIEF COMPLAINT:  Chief Complaint  Patient presents with   Seizures    Rm 7 former patient of Dr Hunter Vega, 2 month FU, wife- Hunter Vega "no new concerns"   Parkinson's disease    HISTORY OF PRESENT ILLNESS:   70 year old male here for evaluation of seizures.  Patient had new onset seizure in 2012 with generalized convulsive seizures.  He is mainly correcting sleep.  He also started having daytime weird sensations and staring spells.  Initially he was treated with levetiracetam but seizures continued.  Ultimately he was changed to lamotrigine and he has not had seizures since 2015.  MRI of the brain was unremarkable.  EEG showed left frontal epileptiform discharges.  Around 2021 patient had new onset of right upper extremity resting tremor.  This was monitored over time.  He had a visit in September 2022 with Dr. Jannifer Vega who diagnosed possible mild parkinsonism.  Patient having constipation, decreased handwriting, masked facies, generalized slow movements.  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  He has a good nutrition with adequate amounts of protein, fruits, vegetables and whole grains.  He has had some issues with mild memory loss.  No major changes in ADLs.  He has been diagnosed with sleep apnea but could not tolerate CPAP.  He does have some mild anxiety issues.   REVIEW OF SYSTEMS: Full 14 system review of systems performed and negative with exception of: As per HPI.  ALLERGIES: Allergies  Allergen Reactions   Penicillins Swelling    Arm swelling Has patient had a PCN reaction causing immediate rash, facial/tongue/throat swelling, SOB or lightheadedness with hypotension: No Has patient had a PCN reaction causing severe rash involving mucus membranes or  skin necrosis: No Has patient had a PCN reaction that required hospitalization: No Has patient had a PCN reaction occurring within the last 10 years: No If all of the above answers are "NO", then may proceed with Cephalosporin use.     HOME MEDICATIONS: Outpatient Medications Prior to Visit  Medication Sig Dispense Refill   aspirin EC 81 MG tablet Take 81 mg by mouth daily.      atorvastatin (LIPITOR) 80 MG tablet Take 1 tablet (80 mg total) by mouth at bedtime. 90 tablet 0   Blood Pressure KIT Check blood pressures 2-3 times weekly 1 kit 0   COVID-19 mRNA bivalent vaccine, Pfizer, injection Inject into the muscle. 0.3 mL 0   ezetimibe (ZETIA) 10 MG tablet Take 1 tablet (10 mg total) by mouth daily. 90 tablet 3   influenza vaccine adjuvanted (FLUAD) 0.5 ML injection Inject into the muscle. 0.5 mL 0   polyethylene glycol powder (GLYCOLAX/MIRALAX) 17 GM/SCOOP powder Use 1 scoop daily 255 g 0   lamoTRIgine (LAMICTAL) 150 MG tablet Take 1 tablet (150 mg total) by mouth 2 (two) times daily. 180 tablet 3   linaclotide (LINZESS) 290 MCG CAPS capsule Take 290 mcg by mouth daily before breakfast.     No facility-administered medications prior to visit.    PAST MEDICAL HISTORY: Past Medical History:  Diagnosis Date   Allergy    mild seasonal   BCC (basal cell carcinoma of skin)    Bilateral inguinal hernia s/p lap repair with mesh 09/26/2017 09/26/2017   CAD (coronary artery disease) 11/04/2015  Constipation    started Linzess monday 08-12-2018    Encounter for long-term (current) use of medications 12/18/2013   History of seizure    reports no seizure since 2015    Hx of exercise stress test    reports  had done to eval since coronary artery stent was placed ; reports test was "normal"    Hyperlipidemia    Left leg numbness 11/22/2016   Mixed hyperlipidemia 11/24/2009   Overview:  IMPRESSION: Check labs today.  Continue Pravachol 40 mg daily.   Mixed sleep apnea 03/30/2011   Parkinson's  disease ([place]) 02/08/2021   Partial epilepsy with impairment of consciousness ([place]) 12/18/2013   PCP NOTES >>>>>>> 02/15/2020   Polypharmacy 12/18/2013   PSVT (paroxysmal supraventricular tachycardia) ([place])    reports no knowledge of this    Seizure disorder ([place]) 05/10/2011   Seizures ([place])    last 2015   Sleep apnea    reports no longer has sicne losing weight     PAST SURGICAL HISTORY: Past Surgical History:  Procedure Laterality Date   CARDIAC CATHETERIZATION     COLONOSCOPY     CORONARY ANGIOPLASTY     CORONARY STENT PLACEMENT  2015   x2. Mid LAD   INGUINAL HERNIA REPAIR Bilateral 09/26/2017   Procedure: LAPAROSCOPIC BILATERAL INGUINAL HERNIA REPAIR;  Surgeon: Michael Boston, MD;  Location: [facility];  Service: General;  Laterality: Bilateral;   INSERTION OF MESH Bilateral 09/26/2017   Procedure: INSERTION OF MESH;  Surgeon: Michael Boston, MD;  Location: [facility];  Service: General;  Laterality: Bilateral;    FAMILY HISTORY: Family History  Problem Relation Age of Onset   Liver disease Mother    Heart attack Father    Diabetes Paternal Uncle    Colon cancer Neg Hx    Esophageal cancer Neg Hx    Prostate cancer Neg Hx    Colon polyps Neg Hx    Rectal cancer Neg Hx    Stomach cancer Neg Hx     SOCIAL HISTORY: Social History   Socioeconomic History   Marital status: Married    Spouse name: Hunter Vega   Number of children: 3   Years of education: Not on file   Highest education level: Not on file  Occupational History   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  Tobacco Use   Smoking status: Never   Smokeless tobacco: Never  Vaping Use   Vaping Use: Never used  Substance and Sexual Activity   Alcohol use: No   Drug use: No   Sexual activity: Yes  Other Topics Concern   Not on file  Social History Narrative   Lives with wife   Caffeine use: 1 cup corfee daily   Left handed    Social Determinants of Health   Financial Resource Strain: Low Risk     Difficulty of Paying Living Expenses: Not hard at all  Food Insecurity: No Food Insecurity   Worried About Charity fundraiser in the Last Year: Never true   Tony in the Last Year: Never true  Transportation Needs: No Transportation Needs   Lack of Transportation (Medical): No   Lack of Transportation (Non-Medical): No  Physical Activity: Sufficiently Active   Days of Exercise per Week: 7 days   Minutes of Exercise per Session: 30 min  Stress: No Stress Concern Present   Feeling of Stress : Not at all  Social Connections: Moderately Integrated   Frequency of Communication with Friends  and Family: More than three times a week   Frequency of Social Gatherings with Friends and Family: More than three times a week   Attends Religious Services: Never   Marine scientist or Organizations: Yes   Attends Archivist Meetings: 1 to 4 times per year   Marital Status: Married  Human resources officer Violence: Not At Risk   Fear of Current or Ex-Partner: No   Emotionally Abused: No   Physically Abused: No   Sexually Abused: No     PHYSICAL EXAM  GENERAL EXAM/CONSTITUTIONAL: Vitals:  Vitals:   04/12/21 0918  BP: (!) 144/74  Pulse: 73  Weight: 155 lb 3.2 oz (70.4 kg)  Height: '5\' 8"'  (1.727 m)   Body mass index is 23.6 kg/m. Wt Readings from Last 3 Encounters:  04/12/21 155 lb 3.2 oz (70.4 kg)  02/08/21 154 lb (69.9 kg)  12/28/20 153 lb (69.4 kg)   Patient is in no distress; well developed, nourished and groomed; neck is supple  CARDIOVASCULAR: Examination of carotid arteries is normal; no carotid bruits Regular rate and rhythm, no murmurs Examination of peripheral vascular system by observation and palpation is normal  EYES: Ophthalmoscopic exam of optic discs and posterior segments is normal; no papilledema or hemorrhages No results found.  MUSCULOSKELETAL: Gait, strength, tone, movements noted in Neurologic exam below  NEUROLOGIC: MENTAL STATUS:  No  flowsheet data found. awake, alert, oriented to person, place and time recent and remote memory intact normal attention and concentration language fluent, comprehension intact, naming intact fund of knowledge appropriate  CRANIAL NERVE:  2nd - no papilledema on fundoscopic exam 2nd, 3rd, 4th, 6th - pupils equal and reactive to light, visual fields full to confrontation, extraocular muscles intact, no nystagmus 5th - facial sensation symmetric 7th - facial strength symmetric 8th - hearing intact 9th - palate elevates symmetrically, uvula midline 11th - shoulder shrug symmetric 12th - tongue protrusion midline MILD MASKED FACIES  MOTOR:  INTERMITTENT RESTING TREMOR IN RUE NO RIGIDITY MILD BRADYKINESIA IN RUE; MODERATE BRADYKINESIA IN LUE normal bulk and tone, full strength in the BUE, BLE  SENSORY:  normal and symmetric to light touch, pinprick, temperature, vibration  COORDINATION:  finger-nose-finger, fine finger movements normal; MILD ACTION TREMOR IN BUE  REFLEXES:  deep tendon reflexes 2+ and symmetric  GAIT/STATION:  narrow based gait; DECR ARM SWING ON RIGHT > LEFT; MILD TREMOR IN RUE WITH WALKING     DIAGNOSTIC DATA (LABS, IMAGING, TESTING) - I reviewed patient records, labs, notes, testing and imaging myself where available.  Lab Results  Component Value Date   WBC 5.3 12/30/2020   HGB 13.2 12/30/2020   HCT 39.7 12/30/2020   MCV 96.4 12/30/2020   PLT 163.0 12/30/2020      Component Value Date/Time   NA 140 06/29/2020 1139   NA 143 01/30/2019 0919   K 4.8 06/29/2020 1139   CL 104 06/29/2020 1139   CO2 30 06/29/2020 1139   GLUCOSE 93 06/29/2020 1139   BUN 19 06/29/2020 1139   BUN 20 01/30/2019 0919   CREATININE 1.01 06/29/2020 1139   CREATININE 1.06 02/12/2020 0958   CALCIUM 9.9 06/29/2020 1139   PROT 6.6 02/12/2020 0958   PROT 6.8 01/30/2019 0919   ALBUMIN 4.3 03/28/2019 1018   ALBUMIN 4.6 01/30/2019 0919   AST 21 12/30/2020 0831   ALT 14  12/30/2020 0831   ALKPHOS 91 03/28/2019 1018   BILITOT 0.7 02/12/2020 0958   BILITOT 0.8 01/30/2019 0919  GFRNONAA 64 01/30/2019 0919   GFRAA 74 01/30/2019 0919   Lab Results  Component Value Date   CHOL 164 12/30/2020   HDL 60.00 12/30/2020   LDLCALC 83 12/30/2020   TRIG 105.0 12/30/2020   CHOLHDL 3 12/30/2020   Lab Results  Component Value Date   HGBA1C 5.8 12/30/2020   Lab Results  Component Value Date   VITAMINB12 550 02/08/2021   Lab Results  Component Value Date   TSH 2.54 02/12/2020     02/05/13 EEG Interpretation : This is an abnormal awake, drowsy and sleep routine adult EEG due to occasional left lateral frontal spikes.    07/14/19 MRI of the brain without contrast shows the following: [I reviewed images myself and agree with interpretation. -VRP]  1.  Scattered T2/FLAIR hyperintense foci in the subcortical and deep white matter.  None of these appear to be acute and most were present on the MRI from 2014.  This is most consistent with mild chronic microvascular ischemic change.  Demyelination or vasculitis is less likely. 2.   Brain volume is normal for age. 3.   There are no acute findings.    ASSESSMENT AND PLAN  70 y.o. year old male here with:   Dx:  1. Parkinson's disease ([place])   2. Seizure disorder ([place])       PLAN:  SEIZURE DISORDER (complex partial seizure disorder; 1st seizure 2012; last sz 2015; meds tried: levetiracetam) - doing well on lamotrigine 124m twice a day   RIGHT UPPER EXTREMITY RESTING TREMOR / bradykinesia / decreased arm swing / masked facies (suspected idiopathic parkinson's disease) - monitor symptoms; optimize nutrition, exercise - may consider amantadine, rasagiline carb/levo in future  Meds ordered this encounter  Medications   linaclotide (LINZESS) 290 MCG CAPS capsule    Sig: Take 1 capsule (290 mcg total) by mouth daily before breakfast.    Dispense:  90 capsule    Refill:  4   lamoTRIgine (LAMICTAL) 150 MG  tablet    Sig: Take 1 tablet (150 mg total) by mouth 2 (two) times daily.    Dispense:  180 tablet    Refill:  4   Return in about 9 months (around 01/10/2022).    VPenni Bombard MD 157/97/2820 160:15AM Certified in Neurology, Neurophysiology and Neuroimaging  [facility] [address] [place] [phone]

## 2021-04-12 NOTE — Patient Instructions (Signed)
  SEIZURE DISORDER (complex partial seizure disorder; 1st seizure 2012; last sz 2015; meds tried: levetiracetam) - doing well on lamotrigine 150mg  twice a day   RIGHT UPPER EXTREMITY TREMOR / bradykinesia (likely mild parkinson's disease) - monitor symptoms; optimize nutrition, exercise

## 2021-04-13 DIAGNOSIS — R311 Benign essential microscopic hematuria: Secondary | ICD-10-CM | POA: Diagnosis not present

## 2021-04-13 DIAGNOSIS — R102 Pelvic and perineal pain: Secondary | ICD-10-CM | POA: Diagnosis not present

## 2021-04-13 DIAGNOSIS — R972 Elevated prostate specific antigen [PSA]: Secondary | ICD-10-CM | POA: Diagnosis not present

## 2021-04-14 ENCOUNTER — Encounter: Payer: Self-pay | Admitting: Cardiology

## 2021-04-14 ENCOUNTER — Encounter: Payer: Self-pay | Admitting: Internal Medicine

## 2021-04-14 DIAGNOSIS — H43813 Vitreous degeneration, bilateral: Secondary | ICD-10-CM | POA: Diagnosis not present

## 2021-04-14 DIAGNOSIS — D4989 Neoplasm of unspecified behavior of other specified sites: Secondary | ICD-10-CM | POA: Diagnosis not present

## 2021-04-14 DIAGNOSIS — H04123 Dry eye syndrome of bilateral lacrimal glands: Secondary | ICD-10-CM | POA: Diagnosis not present

## 2021-04-14 DIAGNOSIS — H524 Presbyopia: Secondary | ICD-10-CM | POA: Diagnosis not present

## 2021-04-14 DIAGNOSIS — H409 Unspecified glaucoma: Secondary | ICD-10-CM | POA: Insufficient documentation

## 2021-04-14 DIAGNOSIS — H2513 Age-related nuclear cataract, bilateral: Secondary | ICD-10-CM | POA: Diagnosis not present

## 2021-04-14 DIAGNOSIS — H52203 Unspecified astigmatism, bilateral: Secondary | ICD-10-CM | POA: Diagnosis not present

## 2021-04-14 DIAGNOSIS — H401134 Primary open-angle glaucoma, bilateral, indeterminate stage: Secondary | ICD-10-CM | POA: Diagnosis not present

## 2021-05-03 ENCOUNTER — Ambulatory Visit: Payer: PPO | Admitting: Cardiology

## 2021-05-11 NOTE — Progress Notes (Deleted)
Cardiology Office Note:    Date:  05/11/2021   ID:  Hunter Vega, Hunter Vega [birth year], MRN [id number]  PCP:  Colon Branch, MD  Cardiologist:  Shirlee More, MD    Referring MD: Colon Branch, MD    ASSESSMENT:    No diagnosis found. PLAN:    In order of problems listed above:  ***   Next appointment: ***   Medication Adjustments/Labs and Tests Ordered: Current medicines are reviewed at length with the patient today.  Concerns regarding medicines are outlined above.  No orders of the defined types were placed in this encounter.  No orders of the defined types were placed in this encounter.   No chief complaint on file.  .[facility]  History of Present Illness:    Hunter Vega is a 70 y.o. male with a hx of Parkinson's disease,CAD with PCI and stent to left anterior sending coronary artery in 2014, hyperlipidemia and SVT  last seen 04/07/2020.  Hunter Vega send me a note that he is pending prostate biopsy January 16. Compliance with diet, lifestyle and medications: *** Past Medical History:  Diagnosis Date   Allergy    mild seasonal   BCC (basal cell carcinoma of skin)    Bilateral inguinal hernia s/p lap repair with mesh 09/26/2017 09/26/2017   CAD (coronary artery disease) 11/04/2015   Constipation    started Linzess monday 08-12-2018    Encounter for long-term (current) use of medications 12/18/2013   History of seizure    reports no seizure since 2015    Hx of exercise stress test    reports  had done to eval since coronary artery stent was placed ; reports test was "normal"    Hyperlipidemia    Left leg numbness 11/22/2016   Mixed hyperlipidemia 11/24/2009   Overview:  IMPRESSION: Check labs today.  Continue Pravachol 40 mg daily.   Mixed sleep apnea 03/30/2011   Parkinson's disease ([place]) 02/08/2021   Partial epilepsy with impairment of consciousness ([place]) 12/18/2013   PCP NOTES >>>>>>> 02/15/2020   Polypharmacy 12/18/2013   PSVT (paroxysmal supraventricular tachycardia)  ([place])    reports no knowledge of this    Seizure disorder ([place]) 05/10/2011   Seizures ([place])    last 2015   Sleep apnea    reports no longer has sicne losing weight     Past Surgical History:  Procedure Laterality Date   CARDIAC CATHETERIZATION     COLONOSCOPY     CORONARY ANGIOPLASTY     CORONARY STENT PLACEMENT  2015   x2. Mid LAD   INGUINAL HERNIA REPAIR Bilateral 09/26/2017   Procedure: LAPAROSCOPIC BILATERAL INGUINAL HERNIA REPAIR;  Surgeon: Michael Boston, MD;  Location: [facility];  Service: General;  Laterality: Bilateral;   INSERTION OF MESH Bilateral 09/26/2017   Procedure: INSERTION OF MESH;  Surgeon: Michael Boston, MD;  Location: [facility];  Service: General;  Laterality: Bilateral;    Current Medications: No outpatient medications have been marked as taking for the 05/12/21 encounter (Appointment) with Richardo Priest, MD.     Allergies:   Penicillins   Social History   Socioeconomic History   Marital status: Married    Spouse name: [name]   Number of children: 3   Years of education: Not on file   Highest education level: Not on file  Occupational History   Occupation: retired/retail-manager [employer]    Comment: retired  Tobacco Use   Smoking status: Never   Smokeless tobacco: Never  Vaping Use  Vaping Use: Never used  Substance and Sexual Activity   Alcohol use: No   Drug use: No   Sexual activity: Yes  Other Topics Concern   Not on file  Social History Narrative   Lives with wife   Caffeine use: 1 cup corfee daily   Left handed    Social Determinants of Health   Financial Resource Strain: Low Risk    Difficulty of Paying Living Expenses: Not hard at all  Food Insecurity: No Food Insecurity   Worried About Charity fundraiser in the Last Year: Never true   Ran Out of Food in the Last Year: Never true  Transportation Needs: No Transportation Needs   Lack of Transportation (Medical): No   Lack of Transportation (Non-Medical): No  Physical Activity:  Sufficiently Active   Days of Exercise per Week: 7 days   Minutes of Exercise per Session: 30 min  Stress: No Stress Concern Present   Feeling of Stress : Not at all  Social Connections: Moderately Integrated   Frequency of Communication with Friends and Family: More than three times a week   Frequency of Social Gatherings with Friends and Family: More than three times a week   Attends Religious Services: Never   Marine scientist or Organizations: Yes   Attends Archivist Meetings: 1 to 4 times per year   Marital Status: Married     Family History: The patient's ***family history includes Diabetes in his paternal uncle; Heart attack in his father; Liver disease in his mother. There is no history of Colon cancer, Esophageal cancer, Prostate cancer, Colon polyps, Rectal cancer, or Stomach cancer. ROS:   Please see the history of present illness.    All other systems reviewed and are negative.  EKGs/Labs/Other Studies Reviewed:    The following studies were reviewed today:  EKG:  EKG ordered today and personally reviewed.  The ekg ordered today demonstrates ***  Recent Labs: 06/29/2020: BUN 19; Creatinine, Ser 1.01; Potassium 4.8; Sodium 140 12/30/2020: ALT 14; Hemoglobin 13.2; Platelets 163.0  Recent Lipid Panel    Component Value Date/Time   CHOL 164 12/30/2020 0831   CHOL 151 01/30/2019 0919   TRIG 105.0 12/30/2020 0831   HDL 60.00 12/30/2020 0831   HDL 67 01/30/2019 0919   CHOLHDL 3 12/30/2020 0831   VLDL 21.0 12/30/2020 0831   LDLCALC 83 12/30/2020 0831   LDLCALC 66 02/12/2020 0958    Physical Exam:    VS:  There were no vitals taken for this visit.    Wt Readings from Last 3 Encounters:  04/12/21 155 lb 3.2 oz (70.4 kg)  02/08/21 154 lb (69.9 kg)  12/28/20 153 lb (69.4 kg)     GEN: *** Well nourished, well developed in no acute distress HEENT: Normal NECK: No JVD; No carotid bruits LYMPHATICS: No lymphadenopathy CARDIAC: ***RRR, no murmurs,  rubs, gallops RESPIRATORY:  Clear to auscultation without rales, wheezing or rhonchi  ABDOMEN: Soft, non-tender, non-distended MUSCULOSKELETAL:  No edema; No deformity  SKIN: Warm and dry NEUROLOGIC:  Alert and oriented x 3 PSYCHIATRIC:  Normal affect    Signed, Shirlee More, MD  05/11/2021 12:18 PM    [facility]

## 2021-05-12 ENCOUNTER — Ambulatory Visit: Payer: PPO | Admitting: Cardiology

## 2021-05-12 ENCOUNTER — Encounter: Payer: Self-pay | Admitting: Cardiology

## 2021-05-12 ENCOUNTER — Other Ambulatory Visit: Payer: Self-pay

## 2021-05-12 VITALS — BP 138/80 | HR 83 | Ht 68.0 in | Wt 153.0 lb

## 2021-05-12 DIAGNOSIS — E782 Mixed hyperlipidemia: Secondary | ICD-10-CM

## 2021-05-12 DIAGNOSIS — I25118 Atherosclerotic heart disease of native coronary artery with other forms of angina pectoris: Secondary | ICD-10-CM

## 2021-05-12 DIAGNOSIS — I471 Supraventricular tachycardia: Secondary | ICD-10-CM | POA: Diagnosis not present

## 2021-05-12 DIAGNOSIS — G20 Parkinson's disease: Secondary | ICD-10-CM | POA: Diagnosis not present

## 2021-05-12 MED ORDER — ATORVASTATIN CALCIUM 80 MG PO TABS: 80.0000 mg | ORAL_TABLET | Freq: Every day | ORAL | 3 refills | Status: DC

## 2021-05-12 NOTE — Patient Instructions (Signed)

## 2021-05-12 NOTE — Progress Notes (Signed)
Cardiology Office Note:    Date:  12/Vega/2022   ID:  Hunter Vega, Hunter Vega, Hunter Vega, MRN [id number]  PCP:  Hunter Branch, MD  Cardiologist:  Hunter More, MD    Referring MD: Hunter Branch, MD    ASSESSMENT:    1. Coronary artery disease of native artery of native heart with stable angina pectoris ([place])   2. Mixed hyperlipidemia   3. PSVT (paroxysmal supraventricular tachycardia) ([facility])   4. Parkinson disease ([place])    PLAN:    In order of problems listed above:  Stable CAD having no angina after PCI stent and on current medical therapy continue aspirin combined lipid-lowering with atorvastatin and Zetia. Stable continue current treatment Stable no recurrence Strongly encouraged him to continue his activities and exercise program mitigates the effects of Parkinson's disease   Next appointment: 1 year   Medication Adjustments/Labs and Tests Ordered: Current medicines are reviewed at length with the patient today.  Concerns regarding medicines are outlined above.  Orders Placed This Encounter  Procedures   EKG 12-Lead   Meds ordered this encounter  Medications   atorvastatin (LIPITOR) 80 MG tablet    Sig: Take 1 tablet (80 mg total) by mouth at bedtime.    Dispense:  90 tablet    Refill:  3    Chief Complaint  Patient presents with   Follow-up   Coronary Artery Disease   History of Present Illness:    Hunter Vega is a 70 y.o. male with a hx of Parkinson's disease,CAD with PCI and stent to left anterior sending coronary artery in 2014, hyperlipidemia and SVT  last seen 04/07/2020.  [name] send me a note that he is pending prostate biopsy January 16.  Compliance with diet, lifestyle and medications: Yes  He remains very active and is not having chest pain edema shortness of breath palpitation or syncope His LDL was above target and Zetia was initiated no muscle pain or weakness Is pending prostate biopsy I told him to hold his aspirin 7 days prior and generally  48 hours after but asked the urologist how long they desired Past Medical History:  Diagnosis Date   Allergy    mild seasonal   BCC (basal cell carcinoma of skin)    Bilateral inguinal hernia s/p lap repair with mesh 09/26/2017 09/26/2017   CAD (coronary artery disease) 11/04/2015   Constipation    started Linzess monday 08-12-2018    Encounter for long-term (current) use of medications 12/18/2013   History of seizure    reports no seizure since 2015    Hx of exercise stress test    reports  had done to eval since coronary artery stent was placed ; reports test was "normal"    Hyperlipidemia    Left leg numbness 11/22/2016   Mixed hyperlipidemia 11/24/2009   Overview:  IMPRESSION: Check labs today.  Continue Pravachol 40 mg daily.   Mixed sleep apnea 03/30/2011   Parkinson's disease ([place]) 02/08/2021   Partial epilepsy with impairment of consciousness ([place]) 12/18/2013   PCP NOTES >>>>>>> 02/15/2020   Polypharmacy 12/18/2013   PSVT (paroxysmal supraventricular tachycardia) ([place])    reports no knowledge of this    Seizure disorder ([place]) 05/10/2011   Seizures ([place])    last 2015   Sleep apnea    reports no longer has sicne losing weight     Past Surgical History:  Procedure Laterality Date   CARDIAC CATHETERIZATION     COLONOSCOPY  CORONARY ANGIOPLASTY     CORONARY STENT PLACEMENT  2015   x2. Mid LAD   INGUINAL HERNIA REPAIR Bilateral 09/26/2017   Procedure: LAPAROSCOPIC BILATERAL INGUINAL HERNIA REPAIR;  Surgeon: Hunter Boston, MD;  Location: [facility];  Service: General;  Laterality: Bilateral;   INSERTION OF MESH Bilateral 09/26/2017   Procedure: INSERTION OF MESH;  Surgeon: Hunter Boston, MD;  Location: [facility];  Service: General;  Laterality: Bilateral;    Current Medications: Current Meds  Medication Sig   aspirin EC 81 MG tablet Take 81 mg by mouth daily.    Blood Pressure KIT Check blood pressures 2-3 times weekly   ezetimibe (ZETIA) 10 MG tablet Take 1 tablet (10 mg total) by mouth  daily.   influenza vaccine adjuvanted (FLUAD) 0.5 ML injection Inject into the muscle.   lamoTRIgine (LAMICTAL) 150 MG tablet Take 1 tablet (150 mg total) by mouth 2 (two) times daily.   latanoprost (XALATAN) 0.005 % ophthalmic solution Place 1 drop into both eyes at bedtime.   linaclotide (LINZESS) 290 MCG CAPS capsule Take 1 capsule (290 mcg total) by mouth daily before breakfast.   polyethylene glycol powder (GLYCOLAX/MIRALAX) 17 GM/SCOOP powder Use 1 scoop daily   [DISCONTINUED] atorvastatin (LIPITOR) 80 MG tablet Take 1 tablet (80 mg total) by mouth at bedtime.     Allergies:   Penicillins   Social History   Socioeconomic History   Marital status: Married    Spouse name: Hunter Vega   Number of children: 3   Years of education: Not on file   Highest education level: Not on file  Occupational History   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  Tobacco Use   Smoking status: Never    Passive exposure: Never   Smokeless tobacco: Never  Vaping Use   Vaping Use: Never used  Substance and Sexual Activity   Alcohol use: No   Drug use: No   Sexual activity: Yes  Other Topics Concern   Not on file  Social History Narrative   Lives with wife   Caffeine use: 1 cup corfee daily   Left handed    Social Determinants of Health   Financial Resource Strain: Low Risk    Difficulty of Paying Living Expenses: Not hard at all  Food Insecurity: No Food Insecurity   Worried About Charity fundraiser in the Last Year: Never true   [place] in the Last Year: Never true  Transportation Needs: No Transportation Needs   Lack of Transportation (Medical): No   Lack of Transportation (Non-Medical): No  Physical Activity: Sufficiently Active   Days of Exercise per Week: 7 days   Minutes of Exercise per Session: 30 min  Stress: No Stress Concern Present   Feeling of Stress : Not at all  Social Connections: Moderately Integrated   Frequency of Communication with Friends and  Family: Vega than three times a week   Frequency of Social Gatherings with Friends and Family: Vega than three times a week   Attends Religious Services: Never   Marine scientist or Organizations: Yes   Attends Archivist Meetings: 1 to 4 times per year   Marital Status: Married     Family History: The patient's family history includes Diabetes in his paternal uncle; Heart attack in his father; Liver disease in his mother. There is no history of Hunter cancer, Esophageal cancer, Prostate cancer, Hunter polyps, Rectal cancer, or Stomach cancer. ROS:   Please see the  history of present illness.    All other systems reviewed and are negative.  EKGs/Labs/Other Studies Reviewed:    The following studies were reviewed today:  EKG:  EKG ordered today and personally reviewed.  The ekg ordered today demonstrates sinus rhythm normal EKG  Recent Labs: 06/29/2020: BUN 19; Creatinine, Ser 1.01; Potassium 4.8; Sodium 140 12/30/2020: ALT 14; Hemoglobin 13.2; Platelets 163.0  Recent Lipid Panel    Component Value Date/Time   CHOL 164 12/30/2020 0831   CHOL 151 01/30/2019 0919   TRIG 105.0 12/30/2020 0831   HDL 60.00 12/30/2020 0831   HDL 67 01/30/2019 0919   CHOLHDL 3 12/30/2020 0831   VLDL 21.0 12/30/2020 0831   LDLCALC 83 12/30/2020 0831   LDLCALC 66 02/12/2020 0958    Physical Exam:    VS:  BP 138/80    Pulse 83    Ht _0  (1.727 m)    Wt 153 lb (69.4 kg)    SpO2 95%    BMI 23.26 kg/m     Wt Readings from Last 3 Encounters:  12/Vega/22 153 lb (69.4 kg)  11/Vega/22 155 lb 3.2 oz (70.4 kg)  02/08/21 154 lb (69.9 kg)     GEN:  Well nourished, well developed in no acute distress HEENT: Normal NECK: No JVD; No carotid bruits LYMPHATICS: No lymphadenopathy CARDIAC: RRR, no murmurs, rubs, gallops RESPIRATORY:  Clear to auscultation without rales, wheezing or rhonchi  ABDOMEN: Soft, non-tender, non-distended MUSCULOSKELETAL:  No edema; No deformity  SKIN: Warm and  dry NEUROLOGIC:  Alert and oriented x 3 PSYCHIATRIC:  Normal affect    Signed, Hunter More, MD  12/Vega/2022 2:24 PM    [place]

## 2021-05-19 DIAGNOSIS — H52203 Unspecified astigmatism, bilateral: Secondary | ICD-10-CM | POA: Diagnosis not present

## 2021-05-19 DIAGNOSIS — H5213 Myopia, bilateral: Secondary | ICD-10-CM | POA: Diagnosis not present

## 2021-05-19 DIAGNOSIS — H524 Presbyopia: Secondary | ICD-10-CM | POA: Diagnosis not present

## 2021-05-19 DIAGNOSIS — D4989 Neoplasm of unspecified behavior of other specified sites: Secondary | ICD-10-CM | POA: Diagnosis not present

## 2021-05-19 DIAGNOSIS — H401131 Primary open-angle glaucoma, bilateral, mild stage: Secondary | ICD-10-CM | POA: Diagnosis not present

## 2021-06-13 DIAGNOSIS — C61 Malignant neoplasm of prostate: Secondary | ICD-10-CM | POA: Diagnosis not present

## 2021-06-13 DIAGNOSIS — R972 Elevated prostate specific antigen [PSA]: Secondary | ICD-10-CM | POA: Diagnosis not present

## 2021-06-13 DIAGNOSIS — D075 Carcinoma in situ of prostate: Secondary | ICD-10-CM | POA: Diagnosis not present

## 2021-06-26 ENCOUNTER — Encounter: Payer: Self-pay | Admitting: Internal Medicine

## 2021-06-26 DIAGNOSIS — E782 Mixed hyperlipidemia: Secondary | ICD-10-CM

## 2021-06-26 DIAGNOSIS — D649 Anemia, unspecified: Secondary | ICD-10-CM

## 2021-06-26 DIAGNOSIS — R739 Hyperglycemia, unspecified: Secondary | ICD-10-CM

## 2021-06-26 DIAGNOSIS — Z0000 Encounter for general adult medical examination without abnormal findings: Secondary | ICD-10-CM

## 2021-06-28 ENCOUNTER — Telehealth: Payer: Self-pay

## 2021-06-28 DIAGNOSIS — G20 Parkinson's disease: Secondary | ICD-10-CM

## 2021-06-28 DIAGNOSIS — Z880 Allergy status to penicillin: Secondary | ICD-10-CM | POA: Diagnosis not present

## 2021-06-28 NOTE — Telephone Encounter (Signed)
Please advise 

## 2021-06-28 NOTE — Telephone Encounter (Signed)
Pt's Spouse, [name], called today stating they were leaving pt's neurology appt with Dr. Eldridge Abrahams and specialist is asking if Dr. Larose Kells will please add a B12 to pt's labs for tomorrow's blood work.  [name] also stated specialist's office would be sending over a request for this.

## 2021-06-29 ENCOUNTER — Other Ambulatory Visit (INDEPENDENT_AMBULATORY_CARE_PROVIDER_SITE_OTHER): Payer: PPO

## 2021-06-29 DIAGNOSIS — Z0000 Encounter for general adult medical examination without abnormal findings: Secondary | ICD-10-CM | POA: Diagnosis not present

## 2021-06-29 DIAGNOSIS — E782 Mixed hyperlipidemia: Secondary | ICD-10-CM | POA: Diagnosis not present

## 2021-06-29 DIAGNOSIS — D649 Anemia, unspecified: Secondary | ICD-10-CM | POA: Diagnosis not present

## 2021-06-29 DIAGNOSIS — G20 Parkinson's disease: Secondary | ICD-10-CM

## 2021-06-29 LAB — CBC WITH DIFFERENTIAL/PLATELET
Basophils Absolute: 0.1 10*3/uL (ref 0.0–0.1)
Basophils Relative: 1.1 % (ref 0.0–3.0)
Eosinophils Absolute: 0.2 10*3/uL (ref 0.0–0.7)
Eosinophils Relative: 4.5 % (ref 0.0–5.0)
HCT: 39.2 % (ref 39.0–52.0)
Hemoglobin: 13 g/dL (ref 13.0–17.0)
Lymphocytes Relative: 27.7 % (ref 12.0–46.0)
Lymphs Abs: 1.5 10*3/uL (ref 0.7–4.0)
MCHC: 33 g/dL (ref 30.0–36.0)
MCV: 95.5 fl (ref 78.0–100.0)
Monocytes Absolute: 0.6 10*3/uL (ref 0.1–1.0)
Monocytes Relative: 11.6 % (ref 3.0–12.0)
Neutro Abs: 2.9 10*3/uL (ref 1.4–7.7)
Neutrophils Relative %: 55.1 % (ref 43.0–77.0)
Platelets: 179 10*3/uL (ref 150.0–400.0)
RBC: 4.1 Mil/uL — ABNORMAL LOW (ref 4.22–5.81)
RDW: 13.2 % (ref 11.5–15.5)
WBC: 5.3 10*3/uL (ref 4.0–10.5)

## 2021-06-29 LAB — TSH: TSH: 2.38 u[IU]/mL (ref 0.35–5.50)

## 2021-06-29 LAB — B12 AND FOLATE PANEL
Folate: 22.8 ng/mL (ref >5.9)
Vitamin B-12: 280 pg/mL (ref 211–911)

## 2021-06-29 LAB — LIPID PANEL
Cholesterol: 139 mg/dL (ref 0–200)
HDL: 59.2 mg/dL (ref >39.00)
LDL Cholesterol: 64 mg/dL (ref 0–99)
NonHDL: 79.41
Total CHOL/HDL Ratio: 2
Triglycerides: 79 mg/dL (ref 0.0–149.0)
VLDL: 15.8 mg/dL (ref 0.0–40.0)

## 2021-06-29 LAB — COMPREHENSIVE METABOLIC PANEL
ALT: 12 U/L (ref 0–53)
AST: 16 U/L (ref 0–37)
Albumin: 4.4 g/dL (ref 3.5–5.2)
Alkaline Phosphatase: 77 U/L (ref 39–117)
BUN: 21 mg/dL (ref 6–23)
CO2: 32 mEq/L (ref 19–32)
Calcium: 9.8 mg/dL (ref 8.4–10.5)
Chloride: 103 mEq/L (ref 96–112)
Creatinine, Ser: 1.19 mg/dL (ref 0.40–1.50)
GFR: 61.06 mL/min (ref >60.00)
Glucose, Bld: 89 mg/dL (ref 70–99)
Potassium: 4.6 mEq/L (ref 3.5–5.1)
Sodium: 140 mEq/L (ref 135–145)
Total Bilirubin: 1.1 mg/dL (ref 0.2–1.2)
Total Protein: 6.9 g/dL (ref 6.0–8.3)

## 2021-06-29 LAB — PSA: PSA: 6.87 ng/mL — ABNORMAL HIGH (ref 0.10–4.00)

## 2021-06-29 NOTE — Telephone Encounter (Signed)
Yes please

## 2021-06-29 NOTE — Telephone Encounter (Signed)
Order placed. Tricia aware.

## 2021-06-30 ENCOUNTER — Ambulatory Visit (INDEPENDENT_AMBULATORY_CARE_PROVIDER_SITE_OTHER): Payer: PPO | Admitting: Internal Medicine

## 2021-06-30 ENCOUNTER — Encounter: Payer: Self-pay | Admitting: Internal Medicine

## 2021-06-30 VITALS — BP 136/66 | HR 63 | Temp 97.7°F | Resp 18 | Ht 68.0 in | Wt 149.1 lb

## 2021-06-30 DIAGNOSIS — G40909 Epilepsy, unspecified, not intractable, without status epilepticus: Secondary | ICD-10-CM

## 2021-06-30 DIAGNOSIS — E782 Mixed hyperlipidemia: Secondary | ICD-10-CM

## 2021-06-30 DIAGNOSIS — C61 Malignant neoplasm of prostate: Secondary | ICD-10-CM | POA: Insufficient documentation

## 2021-06-30 DIAGNOSIS — G20 Parkinson's disease: Secondary | ICD-10-CM

## 2021-06-30 DIAGNOSIS — I25118 Atherosclerotic heart disease of native coronary artery with other forms of angina pectoris: Secondary | ICD-10-CM | POA: Diagnosis not present

## 2021-06-30 HISTORY — DX: Malignant neoplasm of prostate: C61

## 2021-06-30 MED ORDER — EZETIMIBE 10 MG PO TABS: 10.0000 mg | ORAL_TABLET | Freq: Every day | ORAL | 3 refills | Status: DC

## 2021-06-30 NOTE — Assessment & Plan Note (Signed)
Labs done yesterday reviewed with the patient: Y10, folic acid, TSH, CBC and CMP: Normal High cholesterol:   LDL 64.  Excellent control.  Continue atorvastatin, RF Zetia for a year. Seizures, possible Parkinson: Since the last visit, saw neuro locally 04/12/2021, for a SZ d/o and R arm resting tremor. Subsequently saw Dr. Deboraha Sprang 06/10/2021 for a second opinion, he felt that he met criteria for idiopathic Parkinson, to be confirmed depending on dopaminergic response. CAD: Saw cardiology 05/12/2021, felt to be stable. Prostate cancer, microscopic hematuria Saw urology 04/13/2021, based on a PSA of 5.01 March 2021, they prescribed a biopsy.  It was performed 2 weeks ago, showed cancer, per patient is not advanced.  Has an appointment to see urology in few days.  PSA done yesterday slightly higher, copy provided to the patient. Urology also noted microscopic hematuria, they were planning work-up in the future. Preventive care: Recommend a Tdap, Shingrix. RTC CPX 4 months

## 2021-06-30 NOTE — Patient Instructions (Signed)
Vaccines you may like to consider: Tdap (with the whooping cough component) Shingrix  Check the  blood pressure regularly BP GOAL is between 110/65 and  135/85. If it is consistently higher or lower, let me know     GO TO THE FRONT DESK, PLEASE SCHEDULE YOUR APPOINTMENTS Come back for   a physical exam in 4 months

## 2021-06-30 NOTE — Progress Notes (Signed)
Subjective:    Patient ID: Hunter Vega, male    DOB: [birth year], 71 y.o.   MRN: [id number]  DOS:  06/30/2021 Type of visit - description: Follow-up Since the last visit, saw neurology, notes reviewed. Also saw urology, Dx with prostate cancer. He was active using the stationary bike, had to stop because 2 weeks ago felt "something" between the scrotum and the rectum mostly on the right side. Description is vague, he said no lump, perhaps some pain.  Denies any fever chills No chest pain or difficulty breathing  Review of Systems See above   Past Medical History:  Diagnosis Date   Allergy    mild seasonal   BCC (basal cell carcinoma of skin)    Bilateral inguinal hernia s/p lap repair with mesh 09/26/2017 09/26/2017   CAD (coronary artery disease) 11/04/2015   Constipation    started Linzess monday 08-12-2018    Encounter for long-term (current) use of medications 12/18/2013   History of seizure    reports no seizure since 2015    Hx of exercise stress test    reports  had done to eval since coronary artery stent was placed ; reports test was "normal"    Hyperlipidemia    Left leg numbness 11/22/2016   Mixed hyperlipidemia 11/24/2009   Overview:  IMPRESSION: Check labs today.  Continue Pravachol 40 mg daily.   Mixed sleep apnea 03/30/2011   Parkinson's disease ([place]) 02/08/2021   Partial epilepsy with impairment of consciousness ([place]) 12/18/2013   PCP NOTES >>>>>>> 02/15/2020   Polypharmacy 12/18/2013   PSVT (paroxysmal supraventricular tachycardia) ([place])    reports no knowledge of this    Seizure disorder ([place]) 05/10/2011   Seizures ([place])    last 2015   Sleep apnea    reports no longer has sicne losing weight     Past Surgical History:  Procedure Laterality Date   CARDIAC CATHETERIZATION     COLONOSCOPY     CORONARY ANGIOPLASTY     CORONARY STENT PLACEMENT  2015   x2. Mid LAD   INGUINAL HERNIA REPAIR Bilateral 09/26/2017   Procedure: LAPAROSCOPIC BILATERAL INGUINAL HERNIA  REPAIR;  Surgeon: Michael Boston, MD;  Location: [facility];  Service: General;  Laterality: Bilateral;   INSERTION OF MESH Bilateral 09/26/2017   Procedure: INSERTION OF MESH;  Surgeon: Michael Boston, MD;  Location: [facility];  Service: General;  Laterality: Bilateral;    Current Outpatient Medications  Medication Instructions   aspirin EC 81 mg, Oral, Daily   atorvastatin (LIPITOR) 80 mg, Oral, Daily at bedtime   ezetimibe (ZETIA) 10 mg, Oral, Daily   lamoTRIgine (LAMICTAL) 150 mg, Oral, 2 times daily   latanoprost (XALATAN) 0.005 % ophthalmic solution 1 drop, Both Eyes, Daily at bedtime   linaclotide (LINZESS) 290 mcg, Oral, Daily before breakfast   polyethylene glycol powder (GLYCOLAX/MIRALAX) 17 GM/SCOOP powder Use 1 scoop daily       Objective:   Physical Exam BP 136/66 (BP Location: Left Arm, Patient Position: Sitting, Cuff Size: Small)    Pulse 63    Temp 97.7 F (36.5 C) (Oral)    Resp 18    Ht '5\' 8"'  (1.727 m)    Wt 149 lb 2 oz (67.6 kg)    SpO2 90%    BMI 22.67 kg/m  General:   Well developed, NAD, BMI noted. HEENT:  Normocephalic . Face symmetric, atraumatic Lungs:  CTA B Normal respiratory effort, no intercostal retractions, no accessory muscle use. Heart: RRR,  no murmur.  Lower extremities: no pretibial edema bilaterally GU: Scrotum and contents normal.  Perineum exam: Free of lumps or masses.  Nontender, no rash Skin: Not pale. Not jaundice Neurologic:  alert & oriented X3.  Speech normal, bradykinetic, R arm tremor noted. Psych--  Cognition and judgment appear intact.  Cooperative with normal attention span and concentration.  Behavior appropriate. No anxious or depressed appearing.      Assessment       Assessment Hyperglycemia. High cholesterol. CAD PCI  stent LAD 2014   PSVT H/o [facility] 2019 Neurology (Dr. Jannifer Franklin) -H/o Sz -Possible Parkinson's disease Chronic constipation (GI Dr [name])  PLAN Labs done yesterday reviewed with the patient: U20, folic  acid, TSH, CBC and CMP: Normal High cholesterol:   LDL 64.  Excellent control.  Continue atorvastatin, RF Zetia for a year. Seizures, possible Parkinson: Since the last visit, saw neuro locally 04/12/2021, for a SZ d/o and R arm resting tremor. Subsequently saw Dr. Deboraha Sprang 06/10/2021 for a second opinion, he felt that he met criteria for idiopathic Parkinson, to be confirmed depending on dopaminergic response. CAD: Saw cardiology 05/12/2021, felt to be stable. Prostate cancer, microscopic hematuria Saw urology 04/13/2021, based on a PSA of 5.01 March 2021, they prescribed a biopsy.  It was performed 2 weeks ago, showed cancer, per patient is not advanced.  Has an appointment to see urology in few days.  PSA done yesterday slightly higher, copy provided to the patient. Urology also noted microscopic hematuria, they were planning work-up in the future. Preventive care: Recommend a Tdap, Shingrix. RTC CPX 4 months  Time spent 30 minutes, addressing his chronic medical problems, reviewing the chart and discussing labs done in preparation to this visit.  This visit occurred during the SARS-CoV-2 public health emergency.  Safety protocols were in place, including screening questions prior to the visit, additional usage of staff PPE, and extensive cleaning of exam room while observing appropriate contact time as indicated for disinfecting solutions.

## 2021-07-04 DIAGNOSIS — C61 Malignant neoplasm of prostate: Secondary | ICD-10-CM | POA: Diagnosis not present

## 2021-07-21 ENCOUNTER — Encounter: Payer: PPO | Admitting: Internal Medicine

## 2021-07-27 NOTE — Progress Notes (Incomplete)
GU Location of Tumor / Histology: Prostate Ca  If Prostate Cancer, Gleason Score is (4 + 3) and PSA is (5.47 as of 02/2021)  Biopsies: 06/14/2021 Dr. Diona Fanti      Past/Anticipated interventions by urology, if any:   Past/Anticipated interventions by medical oncology, if any:   Weight changes, if any:   IPSS: SHIM:  Bowel/Bladder complaints, if any:    Nausea/Vomiting, if any:   Pain issues, if any:    SAFETY ISSUES: Prior radiation?  Pacemaker/ICD?  Possible current pregnancy? Male Is the patient on methotrexate?   Current Complaints / other details:

## 2021-08-02 DIAGNOSIS — D4989 Neoplasm of unspecified behavior of other specified sites: Secondary | ICD-10-CM | POA: Diagnosis not present

## 2021-08-02 DIAGNOSIS — Z79899 Other long term (current) drug therapy: Secondary | ICD-10-CM | POA: Diagnosis not present

## 2021-08-02 DIAGNOSIS — H2513 Age-related nuclear cataract, bilateral: Secondary | ICD-10-CM | POA: Diagnosis not present

## 2021-08-02 DIAGNOSIS — H401131 Primary open-angle glaucoma, bilateral, mild stage: Secondary | ICD-10-CM | POA: Diagnosis not present

## 2021-08-03 DIAGNOSIS — D4989 Neoplasm of unspecified behavior of other specified sites: Secondary | ICD-10-CM | POA: Insufficient documentation

## 2021-08-03 HISTORY — DX: Neoplasm of unspecified behavior of other specified sites: D49.89

## 2021-08-09 ENCOUNTER — Other Ambulatory Visit: Payer: Self-pay

## 2021-08-09 ENCOUNTER — Ambulatory Visit
Admission: RE | Admit: 2021-08-09 | Discharge: 2021-08-09 | Disposition: A | Discharge status: Home / Self Care | Payer: PPO | Source: Ambulatory Visit | Attending: Radiation Oncology | Admitting: Radiation Oncology

## 2021-08-09 VITALS — BP 144/75 | HR 83 | Temp 97.3°F | Resp 18 | Ht 68.0 in | Wt 152.6 lb

## 2021-08-09 DIAGNOSIS — C61 Malignant neoplasm of prostate: Secondary | ICD-10-CM | POA: Insufficient documentation

## 2021-08-09 DIAGNOSIS — G40909 Epilepsy, unspecified, not intractable, without status epilepticus: Secondary | ICD-10-CM | POA: Diagnosis not present

## 2021-08-09 DIAGNOSIS — G20 Parkinson's disease: Secondary | ICD-10-CM | POA: Insufficient documentation

## 2021-08-09 DIAGNOSIS — Z85828 Personal history of other malignant neoplasm of skin: Secondary | ICD-10-CM | POA: Insufficient documentation

## 2021-08-09 DIAGNOSIS — I2510 Atherosclerotic heart disease of native coronary artery without angina pectoris: Secondary | ICD-10-CM | POA: Diagnosis not present

## 2021-08-09 DIAGNOSIS — G4730 Sleep apnea, unspecified: Secondary | ICD-10-CM | POA: Insufficient documentation

## 2021-08-09 DIAGNOSIS — Z7982 Long term (current) use of aspirin: Secondary | ICD-10-CM | POA: Insufficient documentation

## 2021-08-09 DIAGNOSIS — I471 Supraventricular tachycardia: Secondary | ICD-10-CM | POA: Insufficient documentation

## 2021-08-09 DIAGNOSIS — E785 Hyperlipidemia, unspecified: Secondary | ICD-10-CM | POA: Insufficient documentation

## 2021-08-09 NOTE — Progress Notes (Signed)
Introduced myself to the patient, and wife, as the prostate nurse navigator.  No barriers to care identified at this time.  He is here to discuss his radiation treatment options, and is primarily interested in brachytherapy.  I gave him my business card and asked him to call me with questions or concerns.  Verbalized understanding.  ?

## 2021-08-09 NOTE — Progress Notes (Signed)
?Radiation Oncology         [phone] ?________________________________ ? ?Initial Outpatient Consultation ? ?Name: Hunter Vega MRN: [id number]  ?Date: 08/09/2021  DOB: [birth year] ? ?CC:Paz, Alda Berthold, MD  Franchot Gallo, MD  ? ?REFERRING PHYSICIAN: Franchot Gallo, MD ? ?DIAGNOSIS: 71 y.o. gentleman with Stage T1c adenocarcinoma of the prostate with Gleason score of 3+4, and PSA of 5.47. ? ?  ICD-10-CM   ?1. Malignant neoplasm of prostate ([place])  C61   ?  ? ? ?HISTORY OF PRESENT ILLNESS: Hunter Vega is a 71 y.o. male with a diagnosis of prostate cancer. He has a history of microscopic hematuria back in 2018 and 2019. He was seen by Dr. Diona Fanti at the time and was found to have a PSA of 3.88 in 02/2018. He was referred back to Dr. Diona Fanti in 07/2020 for an elevated PSA of 8 as well as pelvic/perineal pain.  A repeat PSA at that time had decreased to 4.71 so they opted to monitor the PSA.  ? ?His PSA increased to 5.31 in 10/2020 and remained elevated at 5.47 in 02/2021. He continued to have persistent microscopic hematuria and pelvic/perineal pain. Physical exam was unremarkable, including no prostate nodules or perineal abnormalities aside from tenderness. The patient proceeded to transrectal ultrasound with 12 biopsies of the prostate on 06/13/21.  The prostate volume measured 23.3 cc.  Out of 12 core biopsies, 3 were positive.  The maximum Gleason score was 3+4, and this was seen in the left apex lateral (with perineural invasion). Additionally, Gleason 3+3 was seen in the left apex (small focus) and right apex. ? ?The patient reviewed the biopsy results with his urologist and he has kindly been referred today for discussion of potential radiation treatment options. ? ? ?PREVIOUS RADIATION THERAPY: No ? ?PAST MEDICAL HISTORY:  ?Past Medical History:  ?Diagnosis Date  ? Allergy   ? mild seasonal  ? BCC (basal cell carcinoma of skin)   ? Bilateral inguinal hernia s/p lap repair with mesh 09/26/2017  09/26/2017  ? CAD (coronary artery disease) 11/04/2015  ? Constipation   ? started Linzess monday 08-12-2018   ? Encounter for long-term (current) use of medications 12/18/2013  ? History of seizure   ? reports no seizure since 2015   ? Hx of exercise stress test   ? reports  had done to eval since coronary artery stent was placed ; reports test was "normal"   ? Hyperlipidemia   ? Left leg numbness 11/22/2016  ? Mixed hyperlipidemia 11/24/2009  ? Overview:  IMPRESSION: Check labs today.  Continue Pravachol 40 mg daily.  ? Mixed sleep apnea 03/30/2011  ? Parkinson's disease ([place]) 02/08/2021  ? Partial epilepsy with impairment of consciousness ([place]) 12/18/2013  ? PCP NOTES >>>>>>> 02/15/2020  ? Polypharmacy 12/18/2013  ? PSVT (paroxysmal supraventricular tachycardia) ([place])   ? reports no knowledge of this   ? Seizure disorder ([place]) 05/10/2011  ? Seizures ([place])   ? last 2015  ? Sleep apnea   ? reports no longer has sicne losing weight   ?   ? ?PAST SURGICAL HISTORY: ?Past Surgical History:  ?Procedure Laterality Date  ? CARDIAC CATHETERIZATION    ? COLONOSCOPY    ? CORONARY ANGIOPLASTY    ? CORONARY STENT PLACEMENT  2015  ? x2. Mid LAD  ? INGUINAL HERNIA REPAIR Bilateral 09/26/2017  ? Procedure: LAPAROSCOPIC BILATERAL INGUINAL HERNIA REPAIR;  Surgeon: Michael Boston, MD;  Location: [facility];  Service: General;  Laterality: Bilateral;  ?  INSERTION OF MESH Bilateral 09/26/2017  ? Procedure: INSERTION OF MESH;  Surgeon: Michael Boston, MD;  Location: [facility];  Service: General;  Laterality: Bilateral;  ? ? ?FAMILY HISTORY:  ?Family History  ?Problem Relation Age of Onset  ? Liver disease Mother   ? Heart attack Father   ? Diabetes Paternal Uncle   ? Colon cancer Neg Hx   ? Esophageal cancer Neg Hx   ? Prostate cancer Neg Hx   ? Colon polyps Neg Hx   ? Rectal cancer Neg Hx   ? Stomach cancer Neg Hx   ? ? ?SOCIAL HISTORY:  ?Social History  ? ?Socioeconomic History  ? Marital status: Married  ?  Spouse name: [name]  ? Number of children: 3  ?  Years of education: Not on file  ? Highest education level: Not on file  ?Occupational History  ? 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[employer]  ?  Comment: retired  ?Tobacco Use  ? Smoking status: Never  ?  Passive exposure: Never  ? Smokeless tobacco: Never  ?Vaping Use  ? Vaping Use: Never used  ?Substance and Sexual Activity  ? Alcohol use: No  ? Drug use: No  ? Sexual activity: Yes  ?Other Topics Concern  ? Not on file  ?Social History Narrative  ? Lives with wife  ? Caffeine use: 1 cup corfee daily  ? Left handed   ? ?Social Determinants of Health  ? ?Financial Resource Strain: Low Risk   ? Difficulty of Paying Living Expenses: Not hard at all  ?Food Insecurity: No Food Insecurity  ? Worried About Charity fundraiser in the Last Year: Never true  ? Ran Out of Food in the Last Year: Never true  ?Transportation Needs: No Transportation Needs  ? Lack of Transportation (Medical): No  ? Lack of Transportation (Non-Medical): No  ?Physical Activity: Sufficiently Active  ? Days of Exercise per Week: 7 days  ? Minutes of Exercise per Session: 30 min  ?Stress: No Stress Concern Present  ? Feeling of Stress : Not at all  ?Social Connections: Moderately Integrated  ? Frequency of Communication with Friends and Family: More than three times a week  ? Frequency of Social Gatherings with Friends and Family: More than three times a week  ? Attends Religious Services: Never  ? Active Member of Clubs or Organizations: Yes  ? Attends Archivist Meetings: 1 to 4 times per year  ? Marital Status: Married  ?Intimate Partner Violence: Not At Risk  ? Fear of Current or Ex-Partner: No  ? Emotionally Abused: No  ? Physically Abused: No  ? Sexually Abused: No  ? ? ?ALLERGIES: Penicillins ? ?MEDICATIONS:  ?Current Outpatient Medications  ?Medication Sig Dispense Refill  ? aspirin EC 81 MG tablet Take 81 mg by mouth daily.     ? atorvastatin (LIPITOR) 80 MG tablet Take 1 tablet (80 mg total) by mouth at bedtime. 90 tablet 3  ? ezetimibe  (ZETIA) 10 MG tablet Take 1 tablet (10 mg total) by mouth daily. 90 tablet 3  ? lamoTRIgine (LAMICTAL) 150 MG tablet Take 1 tablet (150 mg total) by mouth 2 (two) times daily. 180 tablet 4  ? latanoprost (XALATAN) 0.005 % ophthalmic solution Place 1 drop into both eyes at bedtime.    ? linaclotide (LINZESS) 290 MCG CAPS capsule Take 1 capsule (290 mcg total) by mouth daily before breakfast. 90 capsule 4  ? polyethylene glycol powder (GLYCOLAX/MIRALAX) 17 GM/SCOOP powder Use 1 scoop daily 255  g 0  ? ?No current facility-administered medications for this encounter.  ? ? ?REVIEW OF SYSTEMS:  On review of systems, the patient reports that he is doing well overall. He denies any chest pain, shortness of breath, cough, fevers, chills, night sweats, unintended weight changes. He denies abdominal pain, nausea or vomiting. He does note some constipation, which he is managing with miralax and linzess. He denies any new musculoskeletal or joint aches or pains. His IPSS was 2, indicating mild urinary symptoms. His SHIM was 25, indicating he does not have erectile dysfunction. A complete review of systems is obtained and is otherwise negative. ? ?  ?PHYSICAL EXAM:  ?Wt Readings from Last 3 Encounters:  ?06/30/21 149 lb 2 oz (67.6 kg)  ?05/12/21 153 lb (69.4 kg)  ?04/12/21 155 lb 3.2 oz (70.4 kg)  ? ?Temp Readings from Last 3 Encounters:  ?06/30/21 97.7 ?F (36.5 ?C) (Oral)  ?12/28/20 98 ?F (36.7 ?C) (Oral)  ?06/29/20 98.2 ?F (36.8 ?C) (Oral)  ? ?BP Readings from Last 3 Encounters:  ?06/30/21 136/66  ?05/12/21 138/80  ?04/12/21 (!) 144/74  ? ?Pulse Readings from Last 3 Encounters:  ?06/30/21 63  ?05/12/21 83  ?04/12/21 73  ? ? /10 ? ?In general this is a well appearing Caucasian male in no acute distress. He's alert and oriented x4 and appropriate throughout the examination. Cardiopulmonary assessment is negative for acute distress, and he exhibits normal effort.   ? ? ?KPS = 100 ? ?100 - Normal; no complaints; no evidence of  disease. ?90   - Able to carry on normal activity; minor signs or symptoms of disease. ?80   - Normal activity with effort; some signs or symptoms of disease. ?45   - Cares for self; unable to carry on normal

## 2021-08-10 ENCOUNTER — Encounter: Payer: Self-pay | Admitting: Radiation Oncology

## 2021-08-10 DIAGNOSIS — Z191 Hormone sensitive malignancy status: Secondary | ICD-10-CM | POA: Diagnosis not present

## 2021-08-10 DIAGNOSIS — C61 Malignant neoplasm of prostate: Secondary | ICD-10-CM | POA: Diagnosis not present

## 2021-08-12 ENCOUNTER — Telehealth: Payer: Self-pay | Admitting: *Deleted

## 2021-08-12 NOTE — Telephone Encounter (Signed)
CALLED PATIENT TO INFORM OF PRE-SEED APPTS. FOR 08-18-21, SPOKE WITH PATIENT'S WIFE- [name] Molinelli AND SHE IS AWARE OF THESE APPTS. ?

## 2021-08-15 ENCOUNTER — Other Ambulatory Visit: Payer: Self-pay | Admitting: Urology

## 2021-08-15 NOTE — Progress Notes (Signed)
Pre seed evaluation done pt has current ekg in epic dated 05-12-2021 and will not require ekg for pre seed appt on Thursday 08-18-2021. ?

## 2021-08-16 ENCOUNTER — Telehealth: Payer: Self-pay | Admitting: *Deleted

## 2021-08-16 NOTE — Telephone Encounter (Signed)
CALLED PATIENT TO INFORM OF IMPLANT DATE, LVM FOR A RETURN CALL 

## 2021-08-17 ENCOUNTER — Telehealth: Payer: Self-pay | Admitting: *Deleted

## 2021-08-17 NOTE — Telephone Encounter (Signed)
CALLED PATIENT TO REMIND OF PRE-SEED APPTS. FOR 08-18-21, SPOKE WITH PATIENT'S WIFE- [name] AND SHE IS AWARE OF THESE APPTS. ?

## 2021-08-18 ENCOUNTER — Ambulatory Visit
Admission: RE | Admit: 2021-08-18 | Discharge: 2021-08-18 | Disposition: A | Discharge status: Home / Self Care | Payer: PPO | Source: Ambulatory Visit | Attending: Radiation Oncology | Admitting: Radiation Oncology

## 2021-08-18 ENCOUNTER — Ambulatory Visit
Admission: RE | Admit: 2021-08-18 | Discharge: 2021-08-18 | Disposition: A | Discharge status: Home / Self Care | Payer: PPO | Source: Ambulatory Visit | Attending: Urology | Admitting: Urology

## 2021-08-18 ENCOUNTER — Encounter: Payer: Self-pay | Admitting: Urology

## 2021-08-18 ENCOUNTER — Other Ambulatory Visit: Payer: Self-pay

## 2021-08-18 VITALS — Ht 68.0 in | Wt 149.4 lb

## 2021-08-18 DIAGNOSIS — C61 Malignant neoplasm of prostate: Secondary | ICD-10-CM | POA: Diagnosis not present

## 2021-08-18 DIAGNOSIS — Z191 Hormone sensitive malignancy status: Secondary | ICD-10-CM | POA: Diagnosis not present

## 2021-08-18 NOTE — Progress Notes (Signed)
?  Radiation Oncology         [phone] ?________________________________ ? ?Name: Hunter Vega MRN: [id number]  ?Date: 08/18/2021  DOB: January 10, 1951 ? ?SIMULATION AND TREATMENT PLANNING NOTE ?PUBIC ARCH STUDY ? ?CC:Paz, Alda Berthold, MD  Franchot Gallo, MD ? ?DIAGNOSIS:  71 y.o. gentleman with Stage T1c adenocarcinoma of the prostate with Gleason score of 3+4, and PSA of 5.47. ? ?Oncology History  ?Malignant neoplasm of prostate ([place])  ?06/13/2021 Cancer Staging  ? Staging form: Prostate, AJCC 8th Edition ?- Clinical stage from 06/13/2021: Stage IIB (cT1c, cN0, cM0, PSA: 5.5, Grade Group: 2) - Signed by Freeman Caldron, PA-C on 08/09/2021 ?Histopathologic type: Adenocarcinoma, NOS ?Stage prefix: Initial diagnosis ?Prostate specific antigen (PSA) range: Less than 10 ?Gleason primary pattern: 3 ?Gleason secondary pattern: 4 ?Gleason score: 7 ?Histologic grading system: 5 grade system ?Number of biopsy cores examined: 12 ?Number of biopsy cores positive: 3 ?Location of positive needle core biopsies: Both sides ? ?  ?06/30/2021 Initial Diagnosis  ? Malignant neoplasm of prostate ([place]) ?  ? ? ?  ICD-10-CM   ?1. Malignant neoplasm of prostate ([place])  C61   ?  ? ? ?COMPLEX SIMULATION:  The patient presented today for evaluation for possible prostate seed implant. He was brought to the radiation planning suite and placed supine on the CT couch. A 3-dimensional image study set was obtained in upload to the planning computer. There, on each axial slice, I contoured the prostate gland. Then, using three-dimensional radiation planning tools I reconstructed the prostate in view of the structures from the transperineal needle pathway to assess for possible pubic arch interference. In doing so, I did not appreciate any pubic arch interference. Also, the patient's prostate volume was estimated based on the drawn structure. The volume was 23.3 cc.  Given the pubic arch appearance and prostate volume, patient remains a good candidate to  proceed with prostate seed implant. Today, he freely provided informed written consent to proceed. ? ? ? ?PLAN: The patient will undergo prostate seed implant. ? ? ?________________________________ ? ?Sheral Apley Tammi Klippel, M.D. ? ? ? ? ?

## 2021-08-18 NOTE — Progress Notes (Addendum)
Pre-Seed appointment. I verified patient identity and began nursing interview w/ spouse Jamus Loving in attendance. Patient reports no prostate issues at this time. Patient is bothered by recent development of hand tremors due to a diagnosis of Parkinson's. No other issus reported at this time. ? ?Meaningful use complete. ?Urology appointment June 29th, 2023. ?No urinary management medications at this time. ? ?Ht '5\' 8"'$  (1.727 m)   Wt 149 lb 6.4 oz (67.8 kg)   BMI 22.72 kg/m?  ? ?

## 2021-08-22 ENCOUNTER — Other Ambulatory Visit: Payer: Self-pay | Admitting: Urology

## 2021-09-21 ENCOUNTER — Telehealth: Payer: Self-pay | Admitting: Internal Medicine

## 2021-09-21 NOTE — Telephone Encounter (Signed)
Pt's spouse dropped off document to have provider to see and update on pt's chart (9 pages Power of [place]) Document put at front office tray under providers name.  ?

## 2021-09-22 NOTE — Telephone Encounter (Signed)
Placed in your red folder.

## 2021-09-23 NOTE — Telephone Encounter (Signed)
signed

## 2021-09-23 NOTE — Telephone Encounter (Signed)
Sent for scanning

## 2021-10-17 DIAGNOSIS — H04123 Dry eye syndrome of bilateral lacrimal glands: Secondary | ICD-10-CM | POA: Diagnosis not present

## 2021-10-17 DIAGNOSIS — H401131 Primary open-angle glaucoma, bilateral, mild stage: Secondary | ICD-10-CM | POA: Diagnosis not present

## 2021-10-17 DIAGNOSIS — D4989 Neoplasm of unspecified behavior of other specified sites: Secondary | ICD-10-CM | POA: Diagnosis not present

## 2021-10-21 ENCOUNTER — Telehealth: Payer: Self-pay | Admitting: *Deleted

## 2021-10-21 NOTE — Telephone Encounter (Signed)
Returned patient's phone call, lvm for a return call 

## 2021-11-01 ENCOUNTER — Ambulatory Visit: Payer: PPO | Admitting: Internal Medicine

## 2021-11-02 DIAGNOSIS — N401 Enlarged prostate with lower urinary tract symptoms: Secondary | ICD-10-CM | POA: Diagnosis not present

## 2021-11-02 DIAGNOSIS — C61 Malignant neoplasm of prostate: Secondary | ICD-10-CM | POA: Diagnosis not present

## 2021-11-03 DIAGNOSIS — H1189 Other specified disorders of conjunctiva: Secondary | ICD-10-CM | POA: Diagnosis not present

## 2021-11-03 DIAGNOSIS — H18891 Other specified disorders of cornea, right eye: Secondary | ICD-10-CM | POA: Diagnosis not present

## 2021-11-03 DIAGNOSIS — C6901 Malignant neoplasm of right conjunctiva: Secondary | ICD-10-CM | POA: Diagnosis not present

## 2021-11-03 DIAGNOSIS — H119 Unspecified disorder of conjunctiva: Secondary | ICD-10-CM | POA: Diagnosis not present

## 2021-11-03 HISTORY — PX: OTHER SURGICAL HISTORY: SHX169

## 2021-11-18 ENCOUNTER — Encounter (HOSPITAL_BASED_OUTPATIENT_CLINIC_OR_DEPARTMENT_OTHER): Payer: Self-pay | Admitting: Urology

## 2021-11-18 ENCOUNTER — Other Ambulatory Visit: Payer: Self-pay

## 2021-11-23 ENCOUNTER — Telehealth: Payer: Self-pay | Admitting: *Deleted

## 2021-11-23 NOTE — H&P (Signed)
H&P  Chief Complaint: Prostate cancer  History of Present Illness: 71 yo male presents for I 125 brachytherapy and SpaceOAR placement for treatment of GG 2 PCa. Past Medical History:  Diagnosis Date   Allergy    mild seasonal   BCC (basal cell carcinoma of skin)    Bilateral inguinal hernia s/p lap repair with mesh 09/26/2017 09/26/2017   CAD (coronary artery disease) 11/04/2015   Constipation    started Linzess monday 08-12-2018    Glaucoma both eyes    History of seizure    reports no seizure since 2015    HIstory of Sleep apnea    reports no longer has since losing weight   Hx of exercise stress test    reports  had done to eval since coronary artery stent was placed ; reports test was "normal"    Hyperlipidemia    Mixed hyperlipidemia 11/24/2009   Overview:  IMPRESSION: Check labs today.  Continue Pravachol 40 mg daily.   Parkinson's disease ([place]) 02/08/2021   Partial epilepsy with impairment of consciousness ([place]) 12/18/2013   Prostate cancer ([place])    PSVT (paroxysmal supraventricular tachycardia) ([place])    reports no knowledge of this    Right hand Tremors of nervous system    Seizure disorder ([place]) 05/10/2011    Past Surgical History:  Procedure Laterality Date   CARDIAC CATHETERIZATION  2015   COLONOSCOPY     2021   CORONARY ANGIOPLASTY  2015   CORONARY STENT PLACEMENT  2015   x2. Mid LAD   INGUINAL HERNIA REPAIR Bilateral 09/26/2017   Procedure: LAPAROSCOPIC BILATERAL INGUINAL HERNIA REPAIR;  Surgeon: Michael Boston, MD;  Location[facility];  Service: General;  Laterality: Bilateral;   INSERTION OF MESH Bilateral 09/26/2017   Procedure: INSERTION OF MESH;  Surgeon: Michael Boston, MD;  Location: [facility];  Service: General;  Laterality: Bilateral;   right eye cornea surgery  11/03/2021   @ [facility]    Home Medications:  Allergies as of 11/23/2021       Reactions   Penicillins Swelling   Arm swelling Has patient had a PCN reaction causing immediate rash,  facial/tongue/throat swelling, SOB or lightheadedness with hypotension: No Has patient had a PCN reaction causing severe rash involving mucus membranes or skin necrosis: No Has patient had a PCN reaction that required hospitalization: No Has patient had a PCN reaction occurring within the last 10 years: No If all of the above answers are "NO", then may proceed with Cephalosporin use.        Medication List      Notice   Cannot display discharge medications because the patient has not yet been admitted.     Allergies:  Allergies  Allergen Reactions   Penicillins Swelling    Arm swelling Has patient had a PCN reaction causing immediate rash, facial/tongue/throat swelling, SOB or lightheadedness with hypotension: No Has patient had a PCN reaction causing severe rash involving mucus membranes or skin necrosis: No Has patient had a PCN reaction that required hospitalization: No Has patient had a PCN reaction occurring within the last 10 years: No If all of the above answers are "NO", then may proceed with Cephalosporin use.     Family History  Problem Relation Age of Onset   Liver disease Mother    Heart attack Father    Diabetes Paternal Uncle    Colon cancer Neg Hx    Esophageal cancer Neg Hx    Prostate cancer Neg Hx  Colon polyps Neg Hx    Rectal cancer Neg Hx    Stomach cancer Neg Hx     Social History:  reports that he has never smoked. He has never been exposed to tobacco smoke. He has never used smokeless tobacco. He reports that he does not drink alcohol and does not use drugs.  ROS: A complete review of systems was performed.  All systems are negative except for pertinent findings as noted.  Physical Exam:  Vital signs in last 24 hours: There were no vitals taken for this visit. Constitutional:  Alert and oriented, No acute distress Cardiovascular: Regular rate  Respiratory: Normal respiratory effort Lymphatic: No lymphadenopathy Neurologic: Grossly  intact, no focal deficits Psychiatric: Normal mood and affect  I have reviewed prior pt notes   I have reviewed urinalysis results  I have independently reviewed prior imaging  I have reviewed prior PSA/pathology results   Impression/Assessment:  GG 2 prostatic adenocarcinoma   Plan:  I 125 brachytherapy, placement of SpaceOAR

## 2021-11-23 NOTE — Telephone Encounter (Signed)
CALLED PATIENT TO REMIND OF PROCEDURE FOR 11-24-21, LVM FOR A RETURN CALL

## 2021-11-24 ENCOUNTER — Ambulatory Visit (HOSPITAL_BASED_OUTPATIENT_CLINIC_OR_DEPARTMENT_OTHER): Payer: PPO | Admitting: Anesthesiology

## 2021-11-24 ENCOUNTER — Other Ambulatory Visit: Payer: Self-pay

## 2021-11-24 ENCOUNTER — Encounter (HOSPITAL_BASED_OUTPATIENT_CLINIC_OR_DEPARTMENT_OTHER): Payer: Self-pay | Admitting: Urology

## 2021-11-24 ENCOUNTER — Encounter (HOSPITAL_BASED_OUTPATIENT_CLINIC_OR_DEPARTMENT_OTHER)
Admission: RE | Disposition: A | Discharge status: Home / Self Care | Payer: Self-pay | Source: Home / Self Care | Attending: Urology

## 2021-11-24 ENCOUNTER — Ambulatory Visit (HOSPITAL_COMMUNITY): Payer: PPO

## 2021-11-24 ENCOUNTER — Ambulatory Visit (HOSPITAL_BASED_OUTPATIENT_CLINIC_OR_DEPARTMENT_OTHER)
Admission: RE | Admit: 2021-11-24 | Discharge: 2021-11-24 | Disposition: A | Discharge status: Home / Self Care | Payer: PPO | Attending: Urology | Admitting: Urology

## 2021-11-24 DIAGNOSIS — I2510 Atherosclerotic heart disease of native coronary artery without angina pectoris: Secondary | ICD-10-CM

## 2021-11-24 DIAGNOSIS — C61 Malignant neoplasm of prostate: Secondary | ICD-10-CM | POA: Insufficient documentation

## 2021-11-24 DIAGNOSIS — Z01812 Encounter for preprocedural laboratory examination: Secondary | ICD-10-CM

## 2021-11-24 DIAGNOSIS — R569 Unspecified convulsions: Secondary | ICD-10-CM | POA: Diagnosis not present

## 2021-11-24 DIAGNOSIS — Z191 Hormone sensitive malignancy status: Secondary | ICD-10-CM | POA: Diagnosis not present

## 2021-11-24 DIAGNOSIS — I25118 Atherosclerotic heart disease of native coronary artery with other forms of angina pectoris: Secondary | ICD-10-CM

## 2021-11-24 DIAGNOSIS — G20 Parkinson's disease: Secondary | ICD-10-CM

## 2021-11-24 DIAGNOSIS — Z01818 Encounter for other preprocedural examination: Secondary | ICD-10-CM

## 2021-11-24 HISTORY — PX: RADIOACTIVE SEED IMPLANT: SHX5150

## 2021-11-24 HISTORY — DX: Tremor, unspecified: R25.1

## 2021-11-24 HISTORY — PX: SPACE OAR INSTILLATION: SHX6769

## 2021-11-24 HISTORY — DX: Malignant neoplasm of prostate: C61

## 2021-11-24 HISTORY — DX: Unspecified glaucoma: H40.9

## 2021-11-24 LAB — POCT I-STAT, CHEM 8
BUN: 33 mg/dL — ABNORMAL HIGH (ref 8–23)
Calcium, Ion: 1.26 mmol/L (ref 1.15–1.40)
Chloride: 104 mmol/L (ref 98–111)
Creatinine, Ser: 1.1 mg/dL (ref 0.61–1.24)
Glucose, Bld: 95 mg/dL (ref 70–99)
HCT: 40 % (ref 39.0–52.0)
Hemoglobin: 13.6 g/dL (ref 13.0–17.0)
Potassium: 4.5 mmol/L (ref 3.5–5.1)
Sodium: 142 mmol/L (ref 135–145)
TCO2: 27 mmol/L (ref 22–32)

## 2021-11-24 SURGERY — INSERTION, RADIATION SOURCE, PROSTATE
Anesthesia: General

## 2021-11-24 MED ORDER — ACETAMINOPHEN 500 MG PO TABS
1000.0000 mg | ORAL_TABLET | Freq: Once | ORAL | Status: AC
Start: 2021-11-24 — End: 2021-11-24
  Administered 2021-11-24: 1000 mg via ORAL

## 2021-11-24 MED ORDER — PHENYLEPHRINE 80 MCG/ML (10ML) SYRINGE FOR IV PUSH (FOR BLOOD PRESSURE SUPPORT)
PREFILLED_SYRINGE | INTRAVENOUS | Status: DC | PRN
  Administered 2021-11-24: 80 ug via INTRAVENOUS

## 2021-11-24 MED ORDER — LIDOCAINE 2% (20 MG/ML) 5 ML SYRINGE
INTRAMUSCULAR | Status: DC | PRN
  Administered 2021-11-24: 60 mg via INTRAVENOUS

## 2021-11-24 MED ORDER — FENTANYL CITRATE (PF) 100 MCG/2ML IJ SOLN
INTRAMUSCULAR | Status: DC | PRN
  Administered 2021-11-24: 25 ug via INTRAVENOUS
  Administered 2021-11-24: 50 ug via INTRAVENOUS

## 2021-11-24 MED ORDER — EPHEDRINE 5 MG/ML INJ
INTRAVENOUS | Status: AC
  Filled 2021-11-24: qty 5

## 2021-11-24 MED ORDER — IOHEXOL 300 MG/ML  SOLN
INTRAMUSCULAR | Status: DC | PRN
  Administered 2021-11-24: 10 mL

## 2021-11-24 MED ORDER — LACTATED RINGERS IV SOLN: INTRAVENOUS | Status: DC

## 2021-11-24 MED ORDER — STERILE WATER FOR IRRIGATION IR SOLN
Status: DC | PRN
  Administered 2021-11-24: 500 mL

## 2021-11-24 MED ORDER — GLYCOPYRROLATE PF 0.2 MG/ML IJ SOSY
PREFILLED_SYRINGE | INTRAMUSCULAR | Status: DC | PRN
  Administered 2021-11-24: .2 mg via INTRAVENOUS

## 2021-11-24 MED ORDER — FLEET ENEMA 7-19 GM/118ML RE ENEM: 1.0000 | ENEMA | Freq: Once | RECTAL | Status: DC

## 2021-11-24 MED ORDER — ROCURONIUM BROMIDE 10 MG/ML (PF) SYRINGE
PREFILLED_SYRINGE | INTRAVENOUS | Status: AC
  Filled 2021-11-24: qty 10

## 2021-11-24 MED ORDER — PROPOFOL 10 MG/ML IV BOLUS
INTRAVENOUS | Status: DC | PRN
  Administered 2021-11-24: 100 mg via INTRAVENOUS
  Administered 2021-11-24: 40 mg via INTRAVENOUS

## 2021-11-24 MED ORDER — GLYCOPYRROLATE PF 0.2 MG/ML IJ SOSY
PREFILLED_SYRINGE | INTRAMUSCULAR | Status: AC
  Filled 2021-11-24: qty 1

## 2021-11-24 MED ORDER — FENTANYL CITRATE (PF) 100 MCG/2ML IJ SOLN: 25.0000 ug | INTRAMUSCULAR | Status: DC | PRN

## 2021-11-24 MED ORDER — SUGAMMADEX SODIUM 200 MG/2ML IV SOLN
INTRAVENOUS | Status: DC | PRN
  Administered 2021-11-24: 200 mg via INTRAVENOUS
  Administered 2021-11-24 (×2): 100 mg via INTRAVENOUS

## 2021-11-24 MED ORDER — MIDAZOLAM HCL 2 MG/2ML IJ SOLN
INTRAMUSCULAR | Status: AC
  Filled 2021-11-24: qty 2

## 2021-11-24 MED ORDER — SODIUM CHLORIDE (PF) 0.9 % IJ SOLN
INTRAMUSCULAR | Status: DC | PRN
  Administered 2021-11-24: 10 mL

## 2021-11-24 MED ORDER — FENTANYL CITRATE (PF) 100 MCG/2ML IJ SOLN
INTRAMUSCULAR | Status: AC
  Filled 2021-11-24: qty 2

## 2021-11-24 MED ORDER — ONDANSETRON HCL 4 MG/2ML IJ SOLN
INTRAMUSCULAR | Status: AC
  Filled 2021-11-24: qty 2

## 2021-11-24 MED ORDER — LIDOCAINE HCL (PF) 2 % IJ SOLN
INTRAMUSCULAR | Status: AC
  Filled 2021-11-24: qty 5

## 2021-11-24 MED ORDER — ONDANSETRON HCL 4 MG/2ML IJ SOLN
INTRAMUSCULAR | Status: DC | PRN
  Administered 2021-11-24: 4 mg via INTRAVENOUS

## 2021-11-24 MED ORDER — PROPOFOL 10 MG/ML IV BOLUS
INTRAVENOUS | Status: AC
  Filled 2021-11-24: qty 20

## 2021-11-24 MED ORDER — DEXAMETHASONE SODIUM PHOSPHATE 10 MG/ML IJ SOLN
INTRAMUSCULAR | Status: DC | PRN
  Administered 2021-11-24: 10 mg via INTRAVENOUS

## 2021-11-24 MED ORDER — DEXAMETHASONE SODIUM PHOSPHATE 10 MG/ML IJ SOLN
INTRAMUSCULAR | Status: AC
  Filled 2021-11-24: qty 1

## 2021-11-24 MED ORDER — CIPROFLOXACIN IN D5W 400 MG/200ML IV SOLN
INTRAVENOUS | Status: AC
  Filled 2021-11-24: qty 200

## 2021-11-24 MED ORDER — ROCURONIUM BROMIDE 10 MG/ML (PF) SYRINGE
PREFILLED_SYRINGE | INTRAVENOUS | Status: DC | PRN
  Administered 2021-11-24: 70 mg via INTRAVENOUS

## 2021-11-24 MED ORDER — EPHEDRINE SULFATE (PRESSORS) 50 MG/ML IJ SOLN
INTRAMUSCULAR | Status: DC | PRN
  Administered 2021-11-24 (×2): 10 mg via INTRAVENOUS
  Administered 2021-11-24 (×2): 5 mg via INTRAVENOUS
  Administered 2021-11-24 (×2): 10 mg via INTRAVENOUS

## 2021-11-24 MED ORDER — ACETAMINOPHEN 500 MG PO TABS
ORAL_TABLET | ORAL | Status: AC
  Filled 2021-11-24: qty 2

## 2021-11-24 MED ORDER — SODIUM CHLORIDE 0.9 % IV SOLN
INTRAVENOUS | Status: AC | PRN
  Administered 2021-11-24: 1000 mL

## 2021-11-24 MED ORDER — MIDAZOLAM HCL 5 MG/5ML IJ SOLN
INTRAMUSCULAR | Status: DC | PRN
  Administered 2021-11-24: 1 mg via INTRAVENOUS

## 2021-11-24 MED ORDER — CIPROFLOXACIN IN D5W 400 MG/200ML IV SOLN
400.0000 mg | Freq: Two times a day (BID) | INTRAVENOUS | Status: DC
  Administered 2021-11-24: 400 mg via INTRAVENOUS

## 2021-11-24 MED ORDER — ONDANSETRON HCL 4 MG/2ML IJ SOLN: 4.0000 mg | Freq: Once | INTRAMUSCULAR | Status: DC | PRN

## 2021-11-24 SURGICAL SUPPLY — 49 items
BAG DRN RND TRDRP ANRFLXCHMBR (UROLOGICAL SUPPLIES) ×1
BAG URINE DRAIN 2000ML AR STRL (UROLOGICAL SUPPLIES) ×2 IMPLANT
BLADE CLIPPER SENSICLIP SURGIC (BLADE) ×2 IMPLANT
CATH COUDE FOLEY 2W 5CC 18FR (CATHETERS) ×1 IMPLANT
CATH FOLEY 2WAY SLVR  5CC 16FR (CATHETERS) ×2
CATH FOLEY 2WAY SLVR 5CC 16FR (CATHETERS) ×1 IMPLANT
CATH ROBINSON RED A/P 16FR (CATHETERS) IMPLANT
CATH ROBINSON RED A/P 20FR (CATHETERS) ×2 IMPLANT
CLOTH BEACON ORANGE TIMEOUT ST (SAFETY) ×2 IMPLANT
CNTNR URN SCR LID CUP LEK RST (MISCELLANEOUS) ×1 IMPLANT
CONT SPEC 4OZ STRL OR WHT (MISCELLANEOUS) ×2
COVER BACK TABLE 60X90IN (DRAPES) ×2 IMPLANT
COVER MAYO STAND STRL (DRAPES) ×2 IMPLANT
DRSG TEGADERM 4X4.75 (GAUZE/BANDAGES/DRESSINGS) ×2 IMPLANT
DRSG TEGADERM 8X12 (GAUZE/BANDAGES/DRESSINGS) ×2 IMPLANT
GAUZE SPONGE 4X4 3PLY NS LF (GAUZE/BANDAGES/DRESSINGS) ×1 IMPLANT
GEL ULTRASOUND 20GR AQUASONIC (MISCELLANEOUS) ×2 IMPLANT
GLOVE BIO SURGEON STRL SZ 6.5 (GLOVE) ×2 IMPLANT
GLOVE BIO SURGEON STRL SZ7.5 (GLOVE) IMPLANT
GLOVE BIO SURGEON STRL SZ8 (GLOVE) ×4 IMPLANT
GLOVE BIOGEL PI IND STRL 6.5 (GLOVE) IMPLANT
GLOVE BIOGEL PI INDICATOR 6.5 (GLOVE)
GLOVE SURG ORTHO 8.5 STRL (GLOVE) ×2 IMPLANT
GLOVE SURG SS PI 6.5 STRL IVOR (GLOVE) IMPLANT
GOWN STRL REUS W/TWL XL LVL3 (GOWN DISPOSABLE) ×2 IMPLANT
GRID BRACH TEMP 18GA 2.8X3X.75 (MISCELLANEOUS) ×2 IMPLANT
HOLDER FOLEY CATH W/STRAP (MISCELLANEOUS) ×2 IMPLANT
IMPL SPACEOAR VUE SYSTEM (Spacer) ×1 IMPLANT
IMPLANT SPACEOAR VUE SYSTEM (Spacer) ×2 IMPLANT
IV NS 1000ML (IV SOLUTION) ×2
IV NS 1000ML BAXH (IV SOLUTION) ×1 IMPLANT
KIT TURNOVER CYSTO (KITS) ×2 IMPLANT
NDL BRACHY 18G 5PK (NEEDLE) ×4 IMPLANT
NDL BRACHY 18G SINGLE (NEEDLE) IMPLANT
NDL PK MORGANSTERN STABILIZ (NEEDLE) ×1 IMPLANT
NEEDLE BRACHY 18G 5PK (NEEDLE) ×8 IMPLANT
NEEDLE BRACHY 18G SINGLE (NEEDLE) IMPLANT
NEEDLE PK MORGANSTERN STABILIZ (NEEDLE) ×2 IMPLANT
PACK CYSTO (CUSTOM PROCEDURE TRAY) ×2 IMPLANT
QUICK LINK SEEDS ×1 IMPLANT
SET IRRIG Y TYPE TUR BLADDER L (SET/KITS/TRAYS/PACK) ×1 IMPLANT
SHEATH ULTRASOUND LF (SHEATH) IMPLANT
SHEATH ULTRASOUND LTX NONSTRL (SHEATH) IMPLANT
SUT BONE WAX W31G (SUTURE) IMPLANT
SYR 10ML LL (SYRINGE) ×2 IMPLANT
SYR CONTROL 10ML LL (SYRINGE) ×2 IMPLANT
TOWEL OR 17X26 10 PK STRL BLUE (TOWEL DISPOSABLE) ×2 IMPLANT
UNDERPAD 30X36 HEAVY ABSORB (UNDERPADS AND DIAPERS) ×4 IMPLANT
WATER STERILE IRR 500ML POUR (IV SOLUTION) ×2 IMPLANT

## 2021-11-24 NOTE — Anesthesia Preprocedure Evaluation (Addendum)
Anesthesia Evaluation  Patient identified by MRN, date of birth, ID band Patient awake    Reviewed: Allergy & Precautions, NPO status , Patient's Chart, lab work & pertinent test results  Airway Mallampati: II  TM Distance: >3 FB Neck ROM: Full    Dental no notable dental hx. (+) Dental Advisory Given, Teeth Intact   Pulmonary neg pulmonary ROS, neg sleep apnea,    Pulmonary exam normal breath sounds clear to auscultation       Cardiovascular + CAD and + Cardiac Stents (2015)  Normal cardiovascular exam+ dysrhythmias (hx PSVT in past)  Rhythm:Regular Rate:Normal     Neuro/Psych Seizures -, Well Controlled,   Neuromuscular disease (parkinsons ) negative psych ROS   GI/Hepatic negative GI ROS, Neg liver ROS,   Endo/Other  negative endocrine ROS  Renal/GU negative Renal ROS  negative genitourinary   Musculoskeletal negative musculoskeletal ROS (+)   Abdominal   Peds negative pediatric ROS (+)  Hematology negative hematology ROS (+)   Anesthesia Other Findings   Reproductive/Obstetrics negative OB ROS                            Anesthesia Physical Anesthesia Plan  ASA: 2  Anesthesia Plan: General   Post-op Pain Management: Tylenol PO (pre-op)*   Induction: Intravenous  PONV Risk Score and Plan: 2 and Ondansetron and Treatment may vary due to age or medical condition  Airway Management Planned: Oral ETT  Additional Equipment: None  Intra-op Plan:   Post-operative Plan: Extubation in OR  Informed Consent: I have reviewed the patients History and Physical, chart, labs and discussed the procedure including the risks, benefits and alternatives for the proposed anesthesia with the patient or authorized representative who has indicated his/her understanding and acceptance.     Dental advisory given  Plan Discussed with: CRNA  Anesthesia Plan Comments:        Anesthesia Quick  Evaluation

## 2021-11-24 NOTE — Transfer of Care (Signed)
Immediate Anesthesia Transfer of Care Note  Patient: Hunter Vega  Procedure(s) Performed: RADIOACTIVE SEED IMPLANT/BRACHYTHERAPY IMPLANT SPACE OAR INSTILLATION  Patient Location: PACU  Anesthesia Type:General  Level of Consciousness: drowsy, patient cooperative and responds to stimulation  Airway & Oxygen Therapy: Patient Spontanous Breathing  Post-op Assessment: Report given to RN and Post -op Vital signs reviewed and stable  Post vital signs: Reviewed and stable  Last Vitals:  Vitals Value Taken Time  BP 143/72 11/24/21 0917  Temp 36.4 C 11/24/21 0917  Pulse 65 11/24/21 0922  Resp 11 11/24/21 0922  SpO2 96 % 11/24/21 0922  Vitals shown include unvalidated device data.  Last Pain:  Vitals:   11/24/21 0601  TempSrc: Oral  PainSc: 0-No pain      Patients Stated Pain Goal: 5 (10/28/54 1537)  Complications: No notable events documented.

## 2021-11-24 NOTE — Progress Notes (Signed)
Radiation Oncology         [phone] ________________________________  Name: Hunter Vega MRN: [id number]  Date: 11/24/2021  DOB: 30-Apr-1951       Prostate Seed Implant  CC:Paz, Alda Berthold, MD  No ref. provider found  DIAGNOSIS:  71 y.o. gentleman with Stage T1c adenocarcinoma of the prostate with Gleason score of 3+4, and PSA of 5.47  Oncology History  Malignant neoplasm of prostate ([place])  06/13/2021 Cancer Staging   Staging form: Prostate, AJCC 8th Edition - Clinical stage from 06/13/2021: Stage IIB (cT1c, cN0, cM0, PSA: 5.5, Grade Group: 2) - Signed by Freeman Caldron, PA-C on 08/09/2021 Histopathologic type: Adenocarcinoma, NOS Stage prefix: Initial diagnosis Prostate specific antigen (PSA) range: Less than 10 Gleason primary pattern: 3 Gleason secondary pattern: 4 Gleason score: 7 Histologic grading system: 5 grade system Number of biopsy cores examined: 12 Number of biopsy cores positive: 3 Location of positive needle core biopsies: Both sides   06/30/2021 Initial Diagnosis   Malignant neoplasm of prostate ([place])       ICD-10-CM   1. Coronary artery disease of native heart with stable angina pectoris, unspecified vessel or lesion type ([place])  I25.118     2. Pre-op testing  Z01.818     3. Coronary artery disease of native artery of native heart with stable angina pectoris ([facility])  I25.118 I-Stat, Chem 8 on day of surgery per protocol    I-Stat, Chem 8 on day of surgery per protocol    4. Encounter for pre-operative laboratory testing  Z01.812 CBG per Guidelines for Diabetes Management for Patients Undergoing Surgery ([facility], [facility], and [facility] only)    CBG per protocol    CBG per Guidelines for Diabetes Management for Patients Undergoing Surgery ([facility], [facility], and [facility] only)    CBG per protocol      PROCEDURE: Insertion of radioactive I-125 seeds into the prostate gland.  RADIATION DOSE: 145 Gy, definitive therapy.  TECHNIQUE: Hunter Vega was brought to the operating room with  the urologist. He was placed in the dorsolithotomy position. He was catheterized and a rectal tube was inserted. The perineum was shaved, prepped and draped. The ultrasound probe was then introduced into the rectum to see the prostate gland.  TREATMENT DEVICE: A needle grid was attached to the ultrasound probe stand and anchor needles were placed.  3D PLANNING: The prostate was imaged in 3D using a sagittal sweep of the prostate probe. These images were transferred to the planning computer. There, the prostate, urethra and rectum were defined on each axial reconstructed image. Then, the software created an optimized 3D plan and a few seed positions were adjusted. The quality of the plan was reviewed using [facility] information for the target and the following two organs at risk:  Urethra and Rectum.  Then the accepted plan was printed and handed off to the radiation therapist.  Under my supervision, the custom loading of the seeds and spacers was carried out and loaded into sealed vicryl sleeves.  These pre-loaded needles were then placed into the needle holder.Marland Kitchen  PROSTATE VOLUME STUDY:  Using transrectal ultrasound the volume of the prostate was verified to be 29.2 cc.  SPECIAL TREATMENT PROCEDURE/SUPERVISION AND HANDLING: The pre-loaded needles were then delivered under sagittal guidance. A total of 19 needles were used to deposit 64 seeds in the prostate gland. The individual seed activity was 0.339 mCi.  SpaceOAR:  Yes  COMPLEX SIMULATION: At the end of the procedure, an anterior radiograph of the  pelvis was obtained to document seed positioning and count. Cystoscopy was performed to check the urethra and bladder.  MICRODOSIMETRY: At the end of the procedure, the patient was emitting 0.134 mR/hr at 1 meter. Accordingly, he was considered safe for hospital discharge.  PLAN: The patient will return to the radiation oncology clinic for post implant CT dosimetry in three  weeks.   ________________________________  Sheral Apley Tammi Klippel, M.D.

## 2021-11-24 NOTE — Interval H&P Note (Signed)
History and Physical Interval Note:  11/24/2021 7:09 AM  Hunter Vega  has presented today for surgery, with the diagnosis of PROSTATE CANCER.  The various methods of treatment have been discussed with the patient and family. After consideration of risks, benefits and other options for treatment, the patient has consented to  Procedure(s): RADIOACTIVE SEED IMPLANT/BRACHYTHERAPY IMPLANT (N/A) SPACE OAR INSTILLATION (N/A) as a surgical intervention.  The patient's history has been reviewed, patient examined, no change in status, stable for surgery.  I have reviewed the patient's chart and labs.  Questions were answered to the patient's satisfaction.     Lillette Boxer Ameen Mostafa

## 2021-11-24 NOTE — Discharge Instructions (Signed)
Post Anesthesia Home Care Instructions  Activity: Get plenty of rest for the remainder of the day. A responsible individual must stay with you for 24 hours following the procedure.  For the next 24 hours, DO NOT: -Drive a car -Paediatric nurse -Drink alcoholic beverages -Take any medication unless instructed by your physician -Make any legal decisions or sign important papers.  Meals: Start with liquid foods such as gelatin or soup. Progress to regular foods as tolerated. Avoid greasy, spicy, heavy foods. If nausea and/or vomiting occur, drink only clear liquids until the nausea and/or vomiting subsides. Call your physician if vomiting continues.  Special Instructions/Symptoms: Your throat may feel dry or sore from the anesthesia or the breathing tube placed in your throat during surgery. If this causes discomfort, gargle with warm salt water. The discomfort should disappear within 24 hours.  PROSTATE CANCER TREATMENT WITH RADIOACTIVE IODINE-125 SEED IMPLANT  This instruction sheet is intended to discuss implantation of Iodine-125 seeds as treatment for cancer of the prostate. It will explain in detail what you may expect from this treatment and what precautions are necessary as a result of the treatment. Iodine-125 emits a relatively low energy radiation. The radioactive seeds are surgically implanted directly into the prostate gland. Most of the radiation is contained within the prostate gland. A very small amount is present outside the body.The precautions that we ask you to take are to ensure that those around you are protected from unnecessary radiation. The principles of radiation safety that you need to understand are:  DISTANCE: The further a person is from the radioactive implant the less radiation they will be receiving. The amount of radiation received falls off quite rapidly with distance. More specific guidelines are given in the table on the last page.  TIME: The amount of  radiation a person is exposed to is directly proportional to the amount of time that is spent in close proximity to the radioactive implant. Very little radiation will be received during short periods. See the table on the last page for more specific guideline.  CHILDREN UNDER AGE 78 Children should not be allowed to sit on your lap or otherwise be in very close contact for more than a few minutes for the first 6-8 weeks following the implant. You may affectionately greet (hug/kiss) a child for a short period of time, but remember, the longer you are in close proximity with that child the more radiation they are being exposed to. At a distance of 6 feet there is no limit to the length of time you may spend together. See specific guidelines on the last page.  PREGNANT OR POSSIBLY PREGNANT WOMEN Pregnant women should avoid prolonged close physical contact with you for the first 6-8 weeks after implant. At a distance of 6 feet there is no limit to the length of time you may spend together. Pregnant women or possibly pregnant women can safely be in close contact with you for a limited period of time. See the last page for guidelines.  FAMILY RELATIONS You may sleep in the same bed as your partner (provided she is not pregnant or under the age of 82). Sexual intercourse, using a condom, may be resumed 2 weeks after the implant. Your semen may be discolored, dark brown or black. This is normal and is the result of bleeding that may have occurred during the implant. After 3-4 weeks it will not be necessary to use a condom.  DAILY ACTIVITIES You may resume normal activities in a  few days (example: work, shopping, church) without the risk of harmful radiation exposure to those around you provided you keep in mind the time and distance precautions. Objects that you touch or item that you use do not become radioactive. Linens, clothing, tableware, and dishes may be used by other persons without special  precautions. Your bodily wastes (urine and stool) are not radioactive.  SPECIAL PRECAUTIONS It is possible to lose implanted Iodine-125 seed(s) through urination. Although it is possible to pass seeds indefinitely, it is most likely to occur immediately after catheter removal. To prevent this from happening the catheter that was in place during the implant procedure is removed immediately after the implant and a cystoscopy procedure is performed. The process of removing the catheter and the cystoscopy procedure should dislodge and remove any seeds that are not firmly imbedded in the prostate tissue. However, you should watch for seeds if/when you remove your catheter at home. The seeds are silver colored and the size of a grain of rice. In the unlikely event that a seed is seen after urination, simply flush the seed down the toilet. The seed should not be handled with your fingers, not even with a glove or napkin. A spoon or tweezers can be used to pick up a seed. The Radiation Oncology department is open Monday - Friday from 8:00 am to 5:30 pm with a Radiation Oncologist on call at all times. He or she may be reached by calling [phone]. If you are to be hospitalized or if death should occur, your family should notify the Runner, broadcasting/film/video.  SIDE EFFECTS There are very few side effects associate with the implant procedure. Minor burning with urination, weak stream, hesitancy, intermittency, frequency, mild pain or feeling unable to pass your urine freely are common and usually stop in one to four months. If these symptoms are extremely uncomfortable, contact your physician.  RADIATION SAFETY GUIDELINES PROSTATE CANCER TREATMENT WITH RADIOACTIVE IODINE-125 SEED IMPLANT  The following guidelines will limit exposure to less than naturally occurring background radiation.  PERSONS AGE 4-45 (if able to become pregnant)  FOR 8 WEEKS FOLLOWING IMPLANT  At a distance of 1 foot: limit time to  less than 2 hours/week At a distance of 3 feet: limit time to 20 hours/week At a distance of 6 feet: no restrictions  AFTER 8 WEEKS No restrictions  CHILDREN UNDER AGE 4, PREGNANT WOMEN OR POSSIBLY PREGNANT WOMEN  FOR 8 WEEKS FOLLOWING IMPLANT At a distance of 1 foot: limit time to 10 minutes/week At a distance of 3 feet: limit time to 2 hours/week At a distance of 6 feet: no restrictions  AFTER 8 WEEKS No restrictions  PERSONS OVER THE AGE OF 45 AND DO NOT EXPECT TO HAVE ANY MORE CHILDREN No restrictions  Updated by [facility] in January 2020   May take Tylenol beginning at 1 PM as needed for soreness/discomfort.

## 2021-11-24 NOTE — Brief Op Note (Deleted)
Preoperative diagnosis: Clinical stage TI C adenocarcinoma the prostate   Postoperative diagnosis: Same   Procedure: I-125 prostate seed implantation, SpaceOAR placement, flexible cystoscopy  Surgeon: Lillette Boxer. Marlowe Cinquemani M.D.  Radiation Oncologist: Tyler Pita, M.D.  Anesthesia: Gen.   Indications: Patient  was diagnosed with clinical stage TI C prostate cancer. We had extensive discussion with him about treatment options versus. He elected to proceed with seed implantation. He underwent consultation my office as well as with Dr. Tammi Klippel. He appeared to understand the advantages disadvantages potential risks of this treatment option. Full informed consent has been obtained.   Technique and findings: Patient was brought the operating room where he had successful induction of general anesthesia. He was placed in dorso-lithotomy position and prepped and draped in usual manner. Appropriate surgical timeout was performed. Radiation oncology department placed a transrectal ultrasound probe anchoring stand. Foley catheter with contrast in the balloon was inserted without difficulty. Anchoring needles were placed within the prostate. Rectal tube was placed. Real-time contouring of the urethra prostate and rectum were performed and the dosing parameters were established. Targeted dose was 145 gray.  I was then called  to the operating suite suite for placement of the needles. A second timeout was performed. All needle passage was done with real-time transrectal ultrasound guidance with the sagittal plane. A total of 19 needles were placed.  64 active seeds were implanted.  I then proceeded with placement of SpaceOAR by introducing a needle with the bevel angled inferiorly approximately 2 cm superior to the anus. This was angled downward and under direct ultrasound was placed within the space between the prostatic capsule and rectum. This was confirmed with a small amount of sterile saline injected and  this was performed under direct ultrasound. I then attached the SpaceOAR to the needle and injected this in the space between the prostate and rectum with good placement noted. The Foley catheter was removed and flexible cystoscopy failed to show any seeds outside the prostate.  The patient was brought to recovery room in stable condition, having tolerated the procedure well.Marland Kitchen

## 2021-11-24 NOTE — Anesthesia Procedure Notes (Signed)
Procedure Name: Intubation Date/Time: 11/24/2021 7:58 AM  Performed by: Rogers Blocker, CRNAPre-anesthesia Checklist: Patient identified, Emergency Drugs available, Suction available and Patient being monitored Patient Re-evaluated:Patient Re-evaluated prior to induction Oxygen Delivery Method: Circle System Utilized Preoxygenation: Pre-oxygenation with 100% oxygen Induction Type: IV induction Ventilation: Mask ventilation without difficulty Laryngoscope Size: Miller and 2 Grade View: Grade I Tube type: Oral Tube size: 7.5 mm Number of attempts: 1 Airway Equipment and Method: Stylet and Bite block Placement Confirmation: ETT inserted through vocal cords under direct vision, positive ETCO2 and breath sounds checked- equal and bilateral Secured at: 21 cm Tube secured with: Tape Dental Injury: Teeth and Oropharynx as per pre-operative assessment

## 2021-11-24 NOTE — Op Note (Signed)
Preoperative diagnosis: Clinical stage TI C adenocarcinoma the prostate    Postoperative diagnosis: Same    Procedure: I-125 prostate seed implantation, SpaceOAR placement, flexible cystoscopy   Surgeon: Lillette Boxer. Khamani Daniely M.D.   Radiation Oncologist: Tyler Pita, M.D.   Anesthesia: Gen.    Indications: Patient  was diagnosed with clinical stage TI C prostate cancer. We had extensive discussion with him about treatment options versus. He elected to proceed with seed implantation. He underwent consultation my office as well as with Dr. Tammi Klippel. He appeared to understand the advantages disadvantages potential risks of this treatment option. Full informed consent has been obtained.    Technique and findings: Patient was brought the operating room where he had successful induction of general anesthesia. He was placed in dorso-lithotomy position and prepped and draped in usual manner. Appropriate surgical timeout was performed. Radiation oncology department placed a transrectal ultrasound probe anchoring stand. Foley catheter with contrast in the balloon was inserted without difficulty. Anchoring needles were placed within the prostate. Rectal tube was placed. Real-time contouring of the urethra prostate and rectum were performed and the dosing parameters were established. Targeted dose was 145 gray.   I was then called  to the operating suite suite for placement of the needles. A second timeout was performed. All needle passage was done with real-time transrectal ultrasound guidance with the sagittal plane. A total of 19 needles were placed.  64 active seeds were implanted.  I then proceeded with placement of SpaceOAR by introducing a needle with the bevel angled inferiorly approximately 2 cm superior to the anus. This was angled downward and under direct ultrasound was placed within the space between the prostatic capsule and rectum. This was confirmed with a small amount of sterile saline  injected and this was performed under direct ultrasound. I then attached the SpaceOAR to the needle and injected this in the space between the prostate and rectum with good placement noted. The Foley catheter was removed and flexible cystoscopy failed to show any seeds outside the prostate.  The patient was brought to recovery room in stable condition, having tolerated the procedure well.Marland Kitchen

## 2021-11-24 NOTE — Anesthesia Postprocedure Evaluation (Signed)
Anesthesia Post Note  Patient: Hunter Vega  Procedure(s) Performed: RADIOACTIVE SEED IMPLANT/BRACHYTHERAPY IMPLANT SPACE OAR INSTILLATION     Patient location during evaluation: PACU Anesthesia Type: General Level of consciousness: awake and alert, oriented and patient cooperative Pain management: pain level controlled Vital Signs Assessment: post-procedure vital signs reviewed and stable Respiratory status: spontaneous breathing, nonlabored ventilation and respiratory function stable Cardiovascular status: blood pressure returned to baseline and stable Postop Assessment: no apparent nausea or vomiting Anesthetic complications: no   No notable events documented.  Last Vitals:  Vitals:   11/24/21 0930 11/24/21 0945  BP: 134/71 131/72  Pulse: 69 66  Resp: 14 12  Temp:    SpO2: 100% 99%    Last Pain:  Vitals:   11/24/21 0945  TempSrc:   PainSc: 0-No pain                 Pervis Hocking

## 2021-11-25 ENCOUNTER — Encounter (HOSPITAL_BASED_OUTPATIENT_CLINIC_OR_DEPARTMENT_OTHER): Payer: Self-pay | Admitting: Urology

## 2021-12-05 ENCOUNTER — Ambulatory Visit (INDEPENDENT_AMBULATORY_CARE_PROVIDER_SITE_OTHER): Payer: PPO

## 2021-12-05 VITALS — Ht 68.0 in | Wt 145.0 lb

## 2021-12-05 DIAGNOSIS — Z0000 Encounter for general adult medical examination without abnormal findings: Secondary | ICD-10-CM

## 2021-12-05 NOTE — Progress Notes (Addendum)
Subjective:   Hunter Vega is a 71 y.o. male who presents for Medicare Annual/Subsequent preventive examination.  I connected with Hunter Vega today by telephone and verified that I am speaking with the correct person using two identifiers. Location patient: home Location provider: work Persons participating in the virtual visit: patient, Marine scientist.    I discussed the limitations, risks, security and privacy concerns of performing an evaluation and management service by telephone and the availability of in person appointments. I also discussed with the patient that there may be a patient responsible charge related to this service. The patient expressed understanding and verbally consented to this telephonic visit.    Interactive audio and video telecommunications were attempted between this provider and patient, however failed, due to patient having technical difficulties OR patient did not have access to video capability.  We continued and completed visit with audio only.  Some vital signs may be absent or patient reported.   Time Spent with patient on telephone encounter: 20 minutes   Review of Systems     Cardiac Risk Factors include: male gender;advanced age (>64mn, >>58women);dyslipidemia     Objective:    Today's Vitals   12/05/21 1233  Weight: 145 lb (65.8 kg)  Height: '5\' 8"'$  (1.727 m)  PainSc: 4    Body mass index is 22.05 kg/m.     12/05/2021   12:36 PM 11/24/2021    5:59 AM 08/18/2021    8:12 AM 08/09/2021    1:48 PM 11/23/2020    9:02 AM 10/14/2019    8:50 AM 08/16/2018    7:22 AM  Advanced Directives  Does Patient Have a Medical Advance Directive? Yes Yes Yes Yes Yes Yes Yes  Type of AParamedicof AFriendlyLiving will HBarker Ten MileLiving will Living will;Healthcare Power of Attorney Living will;Healthcare Power of AWildroseLiving will HColoLiving will Living will  Does patient  want to make changes to medical advance directive?      No - Patient declined No - Patient declined  Copy of [place] Chart? Yes - validated most recent copy scanned in chart (See row information) No - copy requested   Yes - validated most recent copy scanned in chart (See row information) No - copy requested     Current Medications (verified) Outpatient Encounter Medications as of 12/05/2021  Medication Sig   aspirin EC 81 MG tablet Take 81 mg by mouth daily.    atorvastatin (LIPITOR) 80 MG tablet Take 1 tablet (80 mg total) by mouth at bedtime.   cyanocobalamin 100 MCG tablet Take 1 tablet by mouth daily. Vitamin b   ezetimibe (ZETIA) 10 MG tablet Take 1 tablet (10 mg total) by mouth daily. (Patient taking differently: Take 10 mg by mouth at bedtime.)   lamoTRIgine (LAMICTAL) 150 MG tablet Take 1 tablet (150 mg total) by mouth 2 (two) times daily.   latanoprost (XALATAN) 0.005 % ophthalmic solution Place 1 drop into both eyes at bedtime.   linaclotide (LINZESS) 290 MCG CAPS capsule Take 1 capsule (290 mcg total) by mouth daily before breakfast. (Patient taking differently: Take 290 mcg by mouth as needed.)   neomycin-polymyxin-dexameth (MAXITROL) 0.1 % OINT Place 1 Application into the right eye at bedtime.   OVER THE COUNTER MEDICATION Eye gel otc qhs both eyes   OVER THE COUNTER MEDICATION Blink eye drop 3 x day both eyes   polyethylene glycol powder (GLYCOLAX/MIRALAX) 17 GM/SCOOP powder  Use 1 scoop daily (Patient taking differently: as needed. Use 1 scoop daily)   timolol (TIMOPTIC) 0.5 % ophthalmic solution Place 1 drop into both eyes daily.   No facility-administered encounter medications on file as of 12/05/2021.    Allergies (verified) Penicillins   History: Past Medical History:  Diagnosis Date   Allergy    mild seasonal   BCC (basal cell carcinoma of skin)    Bilateral inguinal hernia s/p lap repair with mesh 09/26/2017 09/26/2017   CAD (coronary artery  disease) 11/04/2015   Constipation    started Linzess monday 08-12-2018    Glaucoma both eyes    History of seizure    reports no seizure since 2015    HIstory of Sleep apnea    reports no longer has since losing weight   Hx of exercise stress test    reports  had done to eval since coronary artery stent was placed ; reports test was "normal"    Hyperlipidemia    Mixed hyperlipidemia 11/24/2009   Overview:  IMPRESSION: Check labs today.  Continue Pravachol 40 mg daily.   Parkinson's disease ([place]) 02/08/2021   Partial epilepsy with impairment of consciousness ([place]) 12/18/2013   Prostate cancer ([place])    PSVT (paroxysmal supraventricular tachycardia) ([place])    reports no knowledge of this    Right hand Tremors of nervous system    Seizure disorder ([place]) 05/10/2011   Past Surgical History:  Procedure Laterality Date   CARDIAC CATHETERIZATION  2015   COLONOSCOPY     2021   CORONARY ANGIOPLASTY  2015   CORONARY STENT PLACEMENT  2015   x2. Mid LAD   INGUINAL HERNIA REPAIR Bilateral 09/26/2017   Procedure: LAPAROSCOPIC BILATERAL INGUINAL HERNIA REPAIR;  Surgeon: Michael Boston, MD;  Location: [facility];  Service: General;  Laterality: Bilateral;   INSERTION OF MESH Bilateral 09/26/2017   Procedure: INSERTION OF MESH;  Surgeon: Michael Boston, MD;  Location: [facility];  Service: General;  Laterality: Bilateral;   RADIOACTIVE SEED IMPLANT N/A 11/24/2021   Procedure: RADIOACTIVE SEED IMPLANT/BRACHYTHERAPY IMPLANT;  Surgeon: Franchot Gallo, MD;  [facility];  Service: Urology;  Laterality: N/A;   right eye cornea surgery  11/03/2021   @ [facility]   SPACE OAR INSTILLATION N/A 11/24/2021   Procedure: SPACE OAR INSTILLATION;  Surgeon: Franchot Gallo, MD;  Location[facility];  Service: Urology;  Laterality: N/A;   Family History  Problem Relation Age of Onset   Liver disease Mother    Heart attack Father    Diabetes Paternal Uncle    Colon cancer Neg  Hx    Esophageal cancer Neg Hx    Prostate cancer Neg Hx    Colon polyps Neg Hx    Rectal cancer Neg Hx    Stomach cancer Neg Hx    Social History   Socioeconomic History   Marital status: Married    Spouse name: Hunter Vega   Number of children: 3   Years of education: Not on file   Highest education level: Not on file  Occupational History   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  Tobacco Use   Smoking status: Never    Passive exposure: Never   Smokeless tobacco: Never  Vaping Use   Vaping Use: Never used  Substance and Sexual Activity   Alcohol use: No   Drug use: No   Sexual activity: Yes  Other Topics Concern   Not on file  Social History Narrative  Lives with wife   Caffeine use: 1 cup corfee daily   Left handed    Social Determinants of Health   Financial Resource Strain: Low Risk  (12/05/2021)   Overall Financial Resource Strain (CARDIA)    Difficulty of Paying Living Expenses: Not hard at all  Food Insecurity: No Food Insecurity (12/05/2021)   Hunger Vital Sign    Worried About Running Out of Food in the Last Year: Never true    Ran Out of Food in the Last Year: Never true  Transportation Needs: No Transportation Needs (12/05/2021)   PRAPARE - Hydrologist (Medical): No    Lack of Transportation (Non-Medical): No  Physical Activity: Sufficiently Active (12/05/2021)   Exercise Vital Sign    Days of Exercise per Week: 5 days    Minutes of Exercise per Session: 30 min  Stress: No Stress Concern Present (12/05/2021)   [place]    Feeling of Stress : Not at all  Social Connections: Moderately Integrated (11/23/2020)   Social Connection and Isolation Panel [NHANES]    Frequency of Communication with Friends and Family: More than three times a week    Frequency of Social Gatherings with Friends and Family: More than three times a week    Attends  Religious Services: Never    Marine scientist or Organizations: Yes    Attends Music therapist: 1 to 4 times per year    Marital Status: Married    Tobacco Counseling Counseling given: Not Answered   Clinical Intake:  Pre-visit preparation completed: Yes  Pain : 0-10 Pain Score: 4  Pain Type: Acute pain (from recent prostate surgery) Pain Onset: More than a month ago Pain Frequency: Constant     BMI - recorded: 22.05 Nutritional Status: BMI of 19-24  Normal Nutritional Risks: None Diabetes: No  How often do you need to have someone help you when you read instructions, pamphlets, or other written materials from your doctor or pharmacy?: 1 - Never  Diabetic?No  Interpreter Needed?: No  Information entered by :: Caroleen Hamman LPn   Activities of Daily Living    12/05/2021   12:38 PM 11/24/2021    6:02 AM  In your present state of health, do you have any difficulty performing the following activities:  Hearing? 0 0  Vision? 0 0  Difficulty concentrating or making decisions? 0 0  Walking or climbing stairs? 0 0  Dressing or bathing? 0 0  Doing errands, shopping? 0   Preparing Food and eating ? N   Using the Toilet? N   In the past six months, have you accidently leaked urine? N   Do you have problems with loss of bowel control? N   Managing your Medications? N   Managing your Finances? N   Housekeeping or managing your Housekeeping? N     Patient Care Team: Colon Branch, MD as PCP - General (Internal Medicine) Michael Boston, MD as Consulting Physician (General Surgery) Richardo Priest, MD as Consulting Physician (Cardiology) Harriett Sine, MD as Consulting Physician (Dermatology) Franchot Gallo, MD as Consulting Physician (Urology) Eldridge Abrahams, MD as Referring Physician (Neurology) Katheren Puller, RN as Oncology Nurse Navigator  Indicate any recent Medical Services you may have received from other than [facility] providers in the  past year (date may be approximate).     Assessment:   This is a routine wellness examination for Nikita.  Hearing/Vision screen  Hearing Screening - Comments:: No issues Vision Screening - Comments:: Last eye exam-10/2021-Dr. Hutto  Dietary issues and exercise activities discussed: Current Exercise Habits: Home exercise routine, Type of exercise: walking, Time (Minutes): 30, Frequency (Times/Week): 5, Weekly Exercise (Minutes/Week): 150, Intensity: Mild, Exercise limited by: None identified   Goals Addressed               This Visit's Progress     Patient Stated     Maintain current healthy lifestyle  (pt-stated)   On track     Other     Patient Stated   On track     Stay hydrated       Depression Screen    12/05/2021   12:38 PM 06/30/2021   10:00 AM 12/28/2020   10:00 AM 11/23/2020    9:04 AM 06/29/2020   11:35 AM 02/12/2020    9:22 AM 10/14/2019    9:06 AM  PHQ 2/9 Scores  PHQ - 2 Score 0 0 0 0 0 0 0    Fall Risk    12/05/2021   12:37 PM 06/30/2021   10:00 AM 04/12/2021    9:21 AM 12/28/2020   10:00 AM 11/23/2020    9:03 AM  Fall Risk   Falls in the past year? 0 0 0 1 0  Number falls in past yr: 0 0  1 0  Injury with Fall? 0 0  0 0  Follow up Falls prevention discussed Falls evaluation completed  Falls evaluation completed Falls prevention discussed    FALL RISK PREVENTION PERTAINING TO THE HOME:  Any stairs in or around the home? Yes  If so, are there any without handrails? No  Home free of loose throw rugs in walkways, pet beds, electrical cords, etc? Yes  Adequate lighting in your home to reduce risk of falls? Yes   ASSISTIVE DEVICES UTILIZED TO PREVENT FALLS:  Life alert? No  Use of a cane, walker or w/c? No  Grab bars in the bathroom? No  Shower chair or bench in shower? No  Elevated toilet seat or a handicapped toilet? No   TIMED UP AND GO:  Was the test performed? No . Phone visit   Cognitive Function:Normal cognitive status assessed by this  Nurse Health Advisor. No abnormalities found.        02/08/2021    9:56 AM  Montreal Cognitive Assessment   Visuospatial/ Executive (0/5) 4  Naming (0/3) 3  Attention: Read list of digits (0/2) 2  Attention: Read list of letters (0/1) 1  Attention: Serial 7 subtraction starting at 100 (0/3) 3  Language: Repeat phrase (0/2) 1  Language : Fluency (0/1) 1  Abstraction (0/2) 2  Delayed Recall (0/5) 2  Orientation (0/6) 6  Total 25      Immunizations Immunization History  Administered Date(s) Administered   Fluad Quad(high Dose 65+) 02/23/2021   Influenza, High Dose Seasonal PF 03/22/2017, 03/26/2018, 02/06/2019   Influenza-Unspecified 03/10/2019, 03/21/2020   PFIZER Comirnaty(Gray Top)Covid-19 Tri-Sucrose Vaccine 06/17/2019, 02/20/2020, 11/02/2020   PFIZER(Purple Top)SARS-COV-2 Vaccination 06/17/2019, 07/08/2019, 02/20/2020   Pfizer Covid-19 Vaccine Bivalent Booster 53yr & up 02/15/2021   Pneumococcal Conjugate-13 03/26/2018   Pneumococcal Polysaccharide-23 06/29/2020   Td 05/29/2009   Zoster, Live 01/02/2014    TDAP status: Due, Education has been provided regarding the importance of this vaccine. Advised may receive this vaccine at local pharmacy or Health Dept. Aware to provide a copy of the vaccination record if obtained from local pharmacy or Health Dept. Verbalized  acceptance and understanding.  Flu Vaccine status: Up to date  Pneumococcal vaccine status: Up to date  Covid-19 vaccine status: Completed vaccines  Qualifies for Shingles Vaccine? Yes   Zostavax completed Yes   Shingrix Completed?: No.    Education has been provided regarding the importance of this vaccine. Patient has been advised to call insurance company to determine out of pocket expense if they have not yet received this vaccine. Advised may also receive vaccine at local pharmacy or Health Dept. Verbalized acceptance and understanding.  Screening Tests Health Maintenance  Topic Date Due   Zoster  Vaccines- Shingrix (1 of 2) Never done   TETANUS/TDAP  05/30/2019   Hepatitis C Screening  05/29/2048 (Originally 01/22/1969)   INFLUENZA VACCINE  12/27/2021   COLONOSCOPY (Pts 45-44yr Insurance coverage will need to be confirmed)  08/15/2025   Pneumonia Vaccine 71 Years old  Completed   COVID-19 Vaccine  Completed   HPV VACCINES  Aged Out    Health Maintenance  Health Maintenance Due  Topic Date Due   Zoster Vaccines- Shingrix (1 of 2) Never done   TETANUS/TDAP  05/30/2019    Colorectal cancer screening: Type of screening: Colonoscopy. Completed 08/26/2018. Repeat every 7 years  Lung Cancer Screening: (Low Dose CT Chest recommended if Age 73-80years, 30 pack-year currently smoking OR have quit w/in 15years.) does not qualify.    Additional Screening:  Hepatitis C Screening: does qualify; Declined  Vision Screening: Recommended annual ophthalmology exams for early detection of glaucoma and other disorders of the eye. Is the patient up to date with their annual eye exam?  Yes  Who is the provider or what is the name of the office in which the patient attends annual eye exams? Dr. HLaban Emperor  Dental Screening: Recommended annual dental exams for proper oral hygiene  Community Resource Referral / Chronic Care Management: CRR required this visit?  No   CCM required this visit?  No      Plan:     I have personally reviewed and noted the following in the patient's chart:   Medical and social history Use of alcohol, tobacco or illicit drugs  Current medications and supplements including opioid prescriptions. Patient is not currently taking opioid prescriptions. Functional ability and status Nutritional status Physical activity Advanced directives List of other physicians Hospitalizations, surgeries, and ER visits in previous 12 months Vitals Screenings to include cognitive, depression, and falls Referrals and appointments  In addition, I have reviewed and discussed  with patient certain preventive protocols, quality metrics, and best practice recommendations. A written personalized care plan for preventive services as well as general preventive health recommendations were provided to patient.   Due to this being a telephonic visit, the after visit summary with patients personalized plan was offered to patient via mail or my-chart. Patient would like to access on my-chart.   MMarta Antu LPN   [id number] Nurse Health Advisor  Nurse Notes: None  I have reviewed and agree with Health Coaches documentation.  JKathlene November MD

## 2021-12-05 NOTE — Patient Instructions (Signed)
Hunter Vega , Thank you for taking time to complete your Medicare Wellness Visit. I appreciate your ongoing commitment to your health goals. Please review the following plan we discussed and let me know if I can assist you in the future.   Screening recommendations/referrals: Colonoscopy: Completed 08/26/2018-Due 08/25/2025 Recommended yearly ophthalmology/optometry visit for glaucoma screening and checkup Recommended yearly dental visit for hygiene and checkup  Vaccinations: Influenza vaccine: Up to date Pneumococcal vaccine: Up to date Tdap vaccine:Due-May obtain vaccine at  your local pharmacy. Shingles vaccine: Due-May obtain vaccine at your local pharmacy. Covid-19: Up to date  Advanced directives: Copy in chart  Conditions/risks identified: See problem list  Next appointment: Follow up in one year for your annual wellness visit.   Preventive Care 73 Years and Older, Male Preventive care refers to lifestyle choices and visits with your health care provider that can promote health and wellness. What does preventive care include? A yearly physical exam. This is also called an annual well check. Dental exams once or twice a year. Routine eye exams. Ask your health care provider how often you should have your eyes checked. Personal lifestyle choices, including: Daily care of your teeth and gums. Regular physical activity. Eating a healthy diet. Avoiding tobacco and drug use. Limiting alcohol use. Practicing safe sex. Taking low doses of aspirin every day. Taking vitamin and mineral supplements as recommended by your health care provider. What happens during an annual well check? The services and screenings done by your health care provider during your annual well check will depend on your age, overall health, lifestyle risk factors, and family history of disease. Counseling  Your health care provider may ask you questions about your: Alcohol use. Tobacco use. Drug  use. Emotional well-being. Home and relationship well-being. Sexual activity. Eating habits. History of falls. Memory and ability to understand (cognition). Work and work Statistician. Screening  You may have the following tests or measurements: Height, weight, and BMI. Blood pressure. Lipid and cholesterol levels. These may be checked every 5 years, or more frequently if you are over 73 years old. Skin check. Lung cancer screening. You may have this screening every year starting at age 73 if you have a 30-pack-year history of smoking and currently smoke or have quit within the past 15 years. Fecal occult blood test (FOBT) of the stool. You may have this test every year starting at age 73. Flexible sigmoidoscopy or colonoscopy. You may have a sigmoidoscopy every 5 years or a colonoscopy every 10 years starting at age 36. Prostate cancer screening. Recommendations will vary depending on your family history and other risks. Hepatitis C blood test. Hepatitis B blood test. Sexually transmitted disease (STD) testing. Diabetes screening. This is done by checking your blood sugar (glucose) after you have not eaten for a while (fasting). You may have this done every 1-3 years. Abdominal aortic aneurysm (AAA) screening. You may need this if you are a current or former smoker. Osteoporosis. You may be screened starting at age 73 if you are at high risk. Talk with your health care provider about your test results, treatment options, and if necessary, the need for more tests. Vaccines  Your health care provider may recommend certain vaccines, such as: Influenza vaccine. This is recommended every year. Tetanus, diphtheria, and acellular pertussis (Tdap, Td) vaccine. You may need a Td booster every 10 years. Zoster vaccine. You may need this after age 73. Pneumococcal 13-valent conjugate (PCV13) vaccine. One dose is recommended after age 73. Pneumococcal polysaccharide (  PPSV23) vaccine. One dose is  recommended after age 73. Talk to your health care provider about which screenings and vaccines you need and how often you need them. This information is not intended to replace advice given to you by your health care provider. Make sure you discuss any questions you have with your health care provider. Document Released: 06/11/2015 Document Revised: 02/02/2016 Document Reviewed: 03/16/2015 Elsevier Interactive Patient Education  2017 [place] [employer] Prevention in the Home Falls can cause injuries. They can happen to people of all ages. There are many things you can do to make your home safe and to help prevent falls. What can I do on the outside of my home? Regularly fix the edges of walkways and driveways and fix any cracks. Remove anything that might make you trip as you walk through a door, such as a raised step or threshold. Trim any bushes or trees on the path to your home. Use bright outdoor lighting. Clear any walking paths of anything that might make someone trip, such as rocks or tools. Regularly check to see if handrails are loose or broken. Make sure that both sides of any steps have handrails. Any raised decks and porches should have guardrails on the edges. Have any leaves, snow, or ice cleared regularly. Use sand or salt on walking paths during winter. Clean up any spills in your garage right away. This includes oil or grease spills. What can I do in the bathroom? Use night lights. Install grab bars by the toilet and in the tub and shower. Do not use towel bars as grab bars. Use non-skid mats or decals in the tub or shower. If you need to sit down in the shower, use a plastic, non-slip stool. Keep the floor dry. Clean up any water that spills on the floor as soon as it happens. Remove soap buildup in the tub or shower regularly. Attach bath mats securely with double-sided non-slip rug tape. Do not have throw rugs and other things on the floor that can make you  trip. What can I do in the bedroom? Use night lights. Make sure that you have a light by your bed that is easy to reach. Do not use any sheets or blankets that are too big for your bed. They should not hang down onto the floor. Have a firm chair that has side arms. You can use this for support while you get dressed. Do not have throw rugs and other things on the floor that can make you trip. What can I do in the kitchen? Clean up any spills right away. Avoid walking on wet floors. Keep items that you use a lot in easy-to-reach places. If you need to reach something above you, use a strong step stool that has a grab bar. Keep electrical cords out of the way. Do not use floor polish or wax that makes floors slippery. If you must use wax, use non-skid floor wax. Do not have throw rugs and other things on the floor that can make you trip. What can I do with my stairs? Do not leave any items on the stairs. Make sure that there are handrails on both sides of the stairs and use them. Fix handrails that are broken or loose. Make sure that handrails are as long as the stairways. Check any carpeting to make sure that it is firmly attached to the stairs. Fix any carpet that is loose or worn. Avoid having throw rugs at the top or  bottom of the stairs. If you do have throw rugs, attach them to the floor with carpet tape. Make sure that you have a light switch at the top of the stairs and the bottom of the stairs. If you do not have them, ask someone to add them for you. What else can I do to help prevent falls? Wear shoes that: Do not have high heels. Have rubber bottoms. Are comfortable and fit you well. Are closed at the toe. Do not wear sandals. If you use a stepladder: Make sure that it is fully opened. Do not climb a closed stepladder. Make sure that both sides of the stepladder are locked into place. Ask someone to hold it for you, if possible. Clearly mark and make sure that you can  see: Any grab bars or handrails. First and last steps. Where the edge of each step is. Use tools that help you move around (mobility aids) if they are needed. These include: Canes. Walkers. Scooters. Crutches. Turn on the lights when you go into a dark area. Replace any light bulbs as soon as they burn out. Set up your furniture so you have a clear path. Avoid moving your furniture around. If any of your floors are uneven, fix them. If there are any pets around you, be aware of where they are. Review your medicines with your doctor. Some medicines can make you feel dizzy. This can increase your chance of falling. Ask your doctor what other things that you can do to help prevent falls. This information is not intended to replace advice given to you by your health care provider. Make sure you discuss any questions you have with your health care provider. Document Released: 03/11/2009 Document Revised: 10/21/2015 Document Reviewed: 06/19/2014 Elsevier Interactive Patient Education  2017 [employer].

## 2021-12-12 DIAGNOSIS — C61 Malignant neoplasm of prostate: Secondary | ICD-10-CM | POA: Diagnosis not present

## 2021-12-13 ENCOUNTER — Telehealth: Payer: Self-pay | Admitting: *Deleted

## 2021-12-13 NOTE — Telephone Encounter (Signed)
CALLED PATIENT TO REMIND OF POST SEED APPTS. FOR 12-15-21, SPOKE WITH PATIENT'S WIFE- [name] AND SHE IS AWARE OF THESE APPTS.

## 2021-12-14 NOTE — Progress Notes (Signed)
Radiation Oncology         [phone] ________________________________  Name: Hunter Vega MRN: [id number]  Date: 12/15/2021  DOB: 01/11/1951  Post-Seed Follow-Up Visit Note  CC: Colon Branch, MD  Franchot Gallo, MD  Diagnosis:    71 y.o. gentleman with Stage T1c adenocarcinoma of the prostate with Gleason score of 3+4, and PSA of 5.47    ICD-10-CM   1. Malignant neoplasm of prostate ([place])  C61       Interval Since Last Radiation:  3 weeks 11/24/21:  Insertion of radioactive I-125 seeds into the prostate gland; 145 Gy, definitive therapy with placement of SpaceOAR gel.  Narrative:  The patient returns today for routine follow-up.  He is complaining of increased urinary frequency and urinary hesitation symptoms. He filled out a questionnaire regarding urinary function today providing and overall IPSS score of 10 characterizing his symptoms as mild-moderate with a weak flow of stream and nocturia x2.  His pre-implant score was 2. He reports that the LUTS are gradually improving and not bothersome at this point.  He specifically denies gross hematuria, dysuria, straining to void, excessive daytime frequency, incomplete bladder emptying or incontinence.  He has some chronic constipation associated with his Parkinson's disease but this has been exacerbated since the time of his procedure.  He has been using MiraLAX and Linzess per his usual protocol but has not had a decent BM in 4 days.  He does report abdominal bloating, rectal pressure/fullness and difficulty passing bowel movements.  He denies abdominal pain or hematochezia.  He does have some mild decreased stamina but overall, is pleased with his progress to date.  ALLERGIES:  is allergic to penicillins.  Meds: Current Outpatient Medications  Medication Sig Dispense Refill   aspirin EC 81 MG tablet Take 81 mg by mouth daily.      atorvastatin (LIPITOR) 80 MG tablet Take 1 tablet (80 mg total) by mouth at bedtime. 90 tablet 3    cyanocobalamin 100 MCG tablet Take 1 tablet by mouth daily. Vitamin b     ezetimibe (ZETIA) 10 MG tablet Take 1 tablet (10 mg total) by mouth daily. (Patient taking differently: Take 10 mg by mouth at bedtime.) 90 tablet 3   lamoTRIgine (LAMICTAL) 150 MG tablet Take 1 tablet (150 mg total) by mouth 2 (two) times daily. 180 tablet 4   latanoprost (XALATAN) 0.005 % ophthalmic solution Place 1 drop into both eyes at bedtime.     linaclotide (LINZESS) 290 MCG CAPS capsule Take 1 capsule (290 mcg total) by mouth daily before breakfast. (Patient taking differently: Take 290 mcg by mouth as needed.) 90 capsule 4   neomycin-polymyxin-dexameth (MAXITROL) 0.1 % OINT Place 1 Application into the right eye at bedtime.     OVER THE COUNTER MEDICATION Eye gel otc qhs both eyes     OVER THE COUNTER MEDICATION Blink eye drop 3 x day both eyes     polyethylene glycol powder (GLYCOLAX/MIRALAX) 17 GM/SCOOP powder Use 1 scoop daily (Patient taking differently: as needed. Use 1 scoop daily) 255 g 0   timolol (TIMOPTIC) 0.5 % ophthalmic solution Place 1 drop into both eyes daily.     No current facility-administered medications for this visit.    Physical Findings: In general this is a well appearing Caucasian male in no acute distress. He's alert and oriented x4 and appropriate throughout the examination. Cardiopulmonary assessment is negative for acute distress and he exhibits normal effort.   Lab Findings: Lab Results  Component Value Date   WBC 5.3 06/29/2021   HGB 13.6 11/24/2021   HCT 40.0 11/24/2021   MCV 95.5 06/29/2021   PLT 179.0 06/29/2021    Radiographic Findings:  Patient underwent CT imaging in our clinic for post implant dosimetry. The CT will be reviewed by Dr. Tammi Klippel to confirm there is an adequate distribution of radioactive seeds throughout the prostate gland and ensure that there are no seeds in or near the rectum.  We suspect the final radiation plan and dosimetry will show appropriate  coverage of the prostate gland. He understands that we will call and inform him of any unexpected findings on further review of his imaging and dosimetry.  Impression/Plan: The patient is recovering from the effects of radiation. His urinary symptoms should gradually improve over the next 4-6 months. We talked about this today. He is encouraged by his improvement already and is otherwise pleased with his outcome. We also talked about long-term follow-up for prostate cancer following seed implant. He understands that ongoing PSA determinations and digital rectal exams will help perform surveillance to rule out disease recurrence. He had a recent follow up appointment with Dr. Diona Fanti on 12/12/2021 and is scheduled for his next visit on 03/17/2022 with labs prior to that visit. He understands what to expect with his PSA measures. Patient was also educated today about some of the long-term effects from radiation including a small risk for rectal bleeding and possibly erectile dysfunction.  He is planning to use his  "purge" bowel protocol that he occasionally uses for severe constipation associated with his Parkinson's and once he achieves a significant BM, we discussed having him continue on MiraLAX twice daily or twice daily every other day to maintain regularity over the next 2 weeks.  I also encouraged him to increase his fluid intake to help maintain bowel regularity.  We talked about some of the general management approaches to these potential complications. However, I did encourage the patient to contact our office or return at any point if he has questions or concerns related to his previous radiation and prostate cancer.    Nicholos Johns, PA-C

## 2021-12-14 NOTE — Progress Notes (Signed)
  Radiation Oncology         (336) [phone] ________________________________  Name: Hunter Vega MRN: [id number]  Date: 12/15/2021  DOB: 24-Jan-1951  COMPLEX SIMULATION NOTE  NARRATIVE:  The patient was brought to the [facility] suite today following prostate seed implantation approximately one month ago.  Identity was confirmed.  All relevant records and images related to the planned course of therapy were reviewed.  Then, the patient was set-up supine.  CT images were obtained.  The CT images were loaded into the planning software.  Then the prostate and rectum were contoured.  Treatment planning then occurred.  The implanted iodine 125 seeds were identified by the physics staff for projection of radiation distribution  I have requested : 3D Simulation  I have requested a DVH of the following structures: Prostate and rectum.    ________________________________  Sheral Apley Tammi Klippel, M.D.

## 2021-12-15 ENCOUNTER — Encounter: Payer: Self-pay | Admitting: Urology

## 2021-12-15 ENCOUNTER — Other Ambulatory Visit: Payer: Self-pay

## 2021-12-15 ENCOUNTER — Ambulatory Visit
Admission: RE | Admit: 2021-12-15 | Discharge: 2021-12-15 | Disposition: A | Discharge status: Home / Self Care | Payer: PPO | Source: Ambulatory Visit | Attending: Radiation Oncology | Admitting: Radiation Oncology

## 2021-12-15 VITALS — BP 124/68 | HR 58 | Temp 97.5°F | Resp 20

## 2021-12-15 DIAGNOSIS — C61 Malignant neoplasm of prostate: Secondary | ICD-10-CM

## 2021-12-15 NOTE — Progress Notes (Signed)
Post-Seed appointment. I verified patient's identity and began nursing interview w/ spouse Mrs. [name] Smelcer in attendance. Patient reports severe constipation (managed w/ MiraLAX and Linzess), w/ rectal pressure and ABD bloating. No other issues reported at this time.  Meaningful use complete. I-PSS score of 10-moderate. No urinary management medications. Urology appt- Mar 24, 2022  BP 124/68   Pulse (!) 58   Temp (!) 97.5 F (36.4 C)   Resp 20   SpO2 100%

## 2021-12-20 ENCOUNTER — Other Ambulatory Visit: Payer: Self-pay | Admitting: Urology

## 2021-12-20 DIAGNOSIS — C61 Malignant neoplasm of prostate: Secondary | ICD-10-CM

## 2021-12-26 ENCOUNTER — Encounter: Payer: Self-pay | Admitting: Radiation Oncology

## 2021-12-26 DIAGNOSIS — C61 Malignant neoplasm of prostate: Secondary | ICD-10-CM | POA: Diagnosis not present

## 2021-12-26 DIAGNOSIS — Z191 Hormone sensitive malignancy status: Secondary | ICD-10-CM | POA: Diagnosis not present

## 2021-12-27 ENCOUNTER — Telehealth: Payer: Self-pay | Admitting: *Deleted

## 2021-12-27 NOTE — Telephone Encounter (Signed)
RETURNED PATIENT'S PHONE [place], SPOKE WITH PATIENT'S WIFE- Hunter Vega

## 2021-12-30 DIAGNOSIS — R3914 Feeling of incomplete bladder emptying: Secondary | ICD-10-CM | POA: Diagnosis not present

## 2022-01-03 ENCOUNTER — Encounter: Payer: Self-pay | Admitting: Internal Medicine

## 2022-01-03 ENCOUNTER — Ambulatory Visit: Payer: PPO | Admitting: Diagnostic Neuroimaging

## 2022-01-03 ENCOUNTER — Ambulatory Visit (INDEPENDENT_AMBULATORY_CARE_PROVIDER_SITE_OTHER): Payer: PPO | Admitting: Internal Medicine

## 2022-01-03 VITALS — BP 136/76 | HR 66 | Temp 98.1°F | Resp 16 | Ht 68.0 in | Wt 149.1 lb

## 2022-01-03 DIAGNOSIS — C61 Malignant neoplasm of prostate: Secondary | ICD-10-CM | POA: Diagnosis not present

## 2022-01-03 DIAGNOSIS — E782 Mixed hyperlipidemia: Secondary | ICD-10-CM | POA: Diagnosis not present

## 2022-01-03 DIAGNOSIS — G40909 Epilepsy, unspecified, not intractable, without status epilepticus: Secondary | ICD-10-CM

## 2022-01-03 DIAGNOSIS — R739 Hyperglycemia, unspecified: Secondary | ICD-10-CM | POA: Diagnosis not present

## 2022-01-03 MED ORDER — SHINGRIX 50 MCG/0.5ML IM SUSR: 0.5000 mL | Freq: Once | INTRAMUSCULAR | 1 refills | Status: AC

## 2022-01-03 MED ORDER — TETANUS-DIPHTH-ACELL PERTUSSIS 5-2.5-18.5 LF-MCG/0.5 IM SUSP: 0.5000 mL | Freq: Once | INTRAMUSCULAR | 0 refills | Status: AC

## 2022-01-03 NOTE — Progress Notes (Unsigned)
Subjective:    Patient ID: Hunter Vega, male    DOB: Mar 02, 1951, 71 y.o.   MRN: [id number]  DOS:  01/03/2022 Type of visit - description: Follow-up  Since the last office visit, had several procedures done included seed implants for prostate cancer. Fortunately he is doing well. Has some difficulty urinating but no dysuria or gross hematuria. History of CAD, no chest pain no difficulty breathing.  Review of Systems See above   Past Medical History:  Diagnosis Date   Allergy    mild seasonal   Annual physical exam 12/18/2013   BCC (basal cell carcinoma of skin)    Bilateral inguinal hernia s/p lap repair with mesh 09/26/2017 09/26/2017   CAD (coronary artery disease) 11/04/2015   Constipation    started Linzess monday 08-12-2018    Corneal intraepithelial neoplasia 08/03/2021   Formatting of this note might be different from the original. Added automatically from request for surgery [id number]   Glaucoma both eyes    History of seizure    reports no seizure since 2015    HIstory of Sleep apnea    reports no longer has since losing weight   Hx of exercise stress test    reports  had done to eval since coronary artery stent was placed ; reports test was "normal"    Hyperlipidemia    Left leg numbness 11/22/2016   Malignant neoplasm of prostate ([place]) 06/30/2021   Mixed hyperlipidemia 11/24/2009   Overview:  IMPRESSION: Check labs today.  Continue Pravachol 40 mg daily.   Mixed sleep apnea 03/30/2011   Parkinson's disease ([place]) 02/08/2021   Partial epilepsy with impairment of consciousness ([place]) 12/18/2013   Polypharmacy 12/18/2013   Prostate cancer ([place])    PSVT (paroxysmal supraventricular tachycardia) ([place])    reports no knowledge of this    Right hand Tremors of nervous system    Seizure disorder ([place]) 05/10/2011   Seizures ([place])    last 2015    Past Surgical History:  Procedure Laterality Date   CARDIAC CATHETERIZATION  2015   COLONOSCOPY     2021   CORONARY  ANGIOPLASTY  2015   CORONARY STENT PLACEMENT  2015   x2. Mid LAD   INGUINAL HERNIA REPAIR Bilateral 09/26/2017   Procedure: LAPAROSCOPIC BILATERAL INGUINAL HERNIA REPAIR;  Surgeon: Michael Boston, MD;  Location: [facility];  Service: General;  Laterality: Bilateral;   INSERTION OF MESH Bilateral 09/26/2017   Procedure: INSERTION OF MESH;  Surgeon: Michael Boston, MD;  Location: [facility];  Service: General;  Laterality: Bilateral;   RADIOACTIVE SEED IMPLANT N/A 11/24/2021   Procedure: RADIOACTIVE SEED IMPLANT/BRACHYTHERAPY IMPLANT;  Surgeon: Franchot Gallo, MD;  Location[facility];  Service: Urology;  Laterality: N/A;   right eye cornea surgery  11/03/2021   @ [facility]   SPACE OAR INSTILLATION N/A 11/24/2021   Procedure: SPACE OAR INSTILLATION;  Surgeon: Franchot Gallo, MD;  Location: [facility];  Service: Urology;  Laterality: N/A;    Current Outpatient Medications  Medication Instructions   aspirin EC 81 mg, Oral, Daily   atorvastatin (LIPITOR) 80 mg, Oral, Daily at bedtime   cyanocobalamin 100 MCG tablet 1 tablet, Oral, Daily, Vitamin b   ezetimibe (ZETIA) 10 mg, Oral, Daily   lamoTRIgine (LAMICTAL) 150 mg, Oral, 2 times daily   latanoprost (XALATAN) 0.005 % ophthalmic solution 1 drop, Both Eyes, Daily at bedtime   linaclotide (LINZESS) 290 mcg, Oral, Daily before breakfast   neomycin-polymyxin-dexameth (MAXITROL) 0.1 % OINT  1 Application, Daily at bedtime   OVER THE COUNTER MEDICATION 1 drop, Both Eyes, Nightly, Eye gel otc    OVER THE COUNTER MEDICATION 1 drop, Both Eyes, 3 times daily, Blink eye drop   polyethylene glycol powder (GLYCOLAX/MIRALAX) 17 GM/SCOOP powder Use 1 scoop daily   tamsulosin (FLOMAX) 0.4 mg, Oral, Daily after supper   timolol (TIMOPTIC) 0.5 % ophthalmic solution 1 drop, Both Eyes, Daily       Objective:   Physical Exam BP 136/76   Pulse 66   Temp 98.1 F (36.7 C) (Oral)   Resp 16   Ht '5\' 8"'$  (1.727 m)   Wt 149 lb 2 oz  (67.6 kg)   SpO2 96%   BMI 22.67 kg/m  General:   Well developed, NAD, BMI noted. HEENT:  Normocephalic . Face symmetric, atraumatic Lungs:  CTA B Normal respiratory effort, no intercostal retractions, no accessory muscle use. Heart: RRR,  no murmur.  Lower extremities: no pretibial edema bilaterally  Skin: Not pale. Not jaundice Neurologic:  alert & oriented X3.  Speech normal, gait appropriate for age and unassisted.  Rhythmic tremor noted at the hands, R>L  Psych--  Cognition and judgment appear intact.  Cooperative with normal attention span and concentration.  Behavior appropriate. No anxious or depressed appearing.      Assessment    Assessment Hyperglycemia. High cholesterol. CAD PCI  stent LAD 2014   PSVT H/o [facility] 2019 Neurology (Dr. Jannifer Franklin) -H/o Sz -Possible Parkinson's disease Chronic constipation (GI Dr [name]) Prostate cancer: seed implant 10-2021 Opht: Glaucoma-cataracts Corneal intraepithelial neoplasm    PLAN Hyperglycemia: Last A1c very good. High cholesterol: Controlled on atorvastatin, Zetia. CAD: Asymptomatic, denies chest pain.  He remains active, take walks at least 4 times a week. Possible parkinsonian, h/o seizure: Patient reports symptoms are stable, not progressing. Prostate cancer: Was Dx with cancer, s/p seed implant 10/2021, very mild LUTS, on Flomax. Preventive care: Vaccine advice provided. RTC 06-2018 for CPE

## 2022-01-03 NOTE — Patient Instructions (Addendum)
Recommend to proceed with the following vaccines at your pharmacy:  Shingrix (shingles) Tdap (tetanus) Flu shot this fall  Check the  blood pressure regularly BP GOAL is between 110/65 and  135/85. If it is consistently higher or lower, let me know      [place], PLEASE SCHEDULE YOUR APPOINTMENTS Come back for   a physical by 06-2022

## 2022-01-04 ENCOUNTER — Ambulatory Visit: Payer: PPO | Admitting: Diagnostic Neuroimaging

## 2022-01-04 ENCOUNTER — Other Ambulatory Visit: Payer: Self-pay

## 2022-01-04 DIAGNOSIS — C61 Malignant neoplasm of prostate: Secondary | ICD-10-CM | POA: Insufficient documentation

## 2022-01-04 DIAGNOSIS — R251 Tremor, unspecified: Secondary | ICD-10-CM | POA: Insufficient documentation

## 2022-01-04 NOTE — Assessment & Plan Note (Signed)
Hyperglycemia: Last A1c very good. High cholesterol: Controlled on atorvastatin, Zetia. CAD: Asymptomatic, denies chest pain.  He remains active, take walks at least 4 times a week. Possible parkinsonian, h/o seizure: Patient reports symptoms are stable, not progressing. Prostate cancer: Was Dx with cancer, s/p seed implant 10/2021, very mild LUTS, on Flomax. Preventive care: Vaccine advice provided. RTC 06-2018 for CPE

## 2022-01-05 ENCOUNTER — Ambulatory Visit (INDEPENDENT_AMBULATORY_CARE_PROVIDER_SITE_OTHER): Payer: PPO | Admitting: Cardiology

## 2022-01-05 ENCOUNTER — Encounter: Payer: Self-pay | Admitting: Cardiology

## 2022-01-05 VITALS — BP 120/66 | HR 66 | Ht 67.0 in | Wt 147.0 lb

## 2022-01-05 DIAGNOSIS — G20 Parkinson's disease: Secondary | ICD-10-CM

## 2022-01-05 DIAGNOSIS — E782 Mixed hyperlipidemia: Secondary | ICD-10-CM | POA: Diagnosis not present

## 2022-01-05 DIAGNOSIS — I25118 Atherosclerotic heart disease of native coronary artery with other forms of angina pectoris: Secondary | ICD-10-CM | POA: Diagnosis not present

## 2022-01-05 DIAGNOSIS — I471 Supraventricular tachycardia: Secondary | ICD-10-CM

## 2022-01-05 NOTE — Addendum Note (Signed)
Addended by: Arayna Illescas, Jonelle Sidle L on: 01/05/2022 02:18 PM   Modules accepted: Orders

## 2022-01-05 NOTE — Patient Instructions (Signed)

## 2022-01-05 NOTE — Progress Notes (Signed)
Cardiology Office Note:    Date:  01/05/2022   ID:  Hunter Vega, Hunter Vega 22-Mar-1951, MRN [id number]  PCP:  Colon Branch, MD  Cardiologist:  Shirlee More, MD    Referring MD: Colon Branch, MD    ASSESSMENT:    1. Coronary artery disease of native artery of native heart with stable angina pectoris ([place])   2. Mixed hyperlipidemia   3. PSVT (paroxysmal supraventricular tachycardia) (HCC)   4. Parkinson disease ([place])    PLAN:    In order of problems listed above:  Hunter Vega has chronic stable CAD asymptomatic following remote PCI having no angina on current medical therapy which has been effective we will continue single antiplatelet aspirin combined lipid-lowering with his high intensity statin and Zetia resulting in ideal LDL and nitroglycerin if needed.  At this time I would not do an ischemia evaluation Well-controlled lipids are at target continue his current effective treatment labs followed in his PCP office Stable no clinical recurrence Stable doing well with his Parkinson's disease   Next appointment: 1 year he will continue to follow in this office with Dr. Agustin Cree as and no longer will have office hours at [facility] as I start slow down   Medication Adjustments/Labs and Tests Ordered: Current medicines are reviewed at length with the patient today.  Concerns regarding medicines are outlined above.  No orders of the defined types were placed in this encounter.  No orders of the defined types were placed in this encounter.   Chief complaint follow-up for CAD   History of Present Illness:    Hunter Vega is a 71 y.o. male with a hx of stable chronic CAD hyperlipidemia PSVT and Parkinson's disease last seen 05/12/2021.  He had PCI and stent left anterior descending coronary artery 2014.Marland Kitchen  Compliance with diet, lifestyle and medications: Yes  He has had another good year not having cardiovascular symptoms of shortness of breath chest pain palpitation or  syncope.  He has varicosities and has a little bit of dependent edema He tolerates his statin without muscle pain or weakness and his lipids are ideal with an LDL of 64 His Parkinson's disease is stable he has mild tremor no exercise intolerance gait disturbance no symptomatic hypotension and is not on directed therapy at this time he follows closely with his neurologist Past Medical History:  Diagnosis Date   Allergy    mild seasonal   Annual physical exam 12/18/2013   BCC (basal cell carcinoma of skin)    Bilateral inguinal hernia s/p lap repair with mesh 09/26/2017 09/26/2017   CAD (coronary artery disease) 11/04/2015   Constipation    started Linzess monday 08-12-2018    Corneal intraepithelial neoplasia 08/03/2021   Formatting of this note might be different from the original. Added automatically from request for surgery [id number]   Glaucoma both eyes    History of seizure    reports no seizure since 2015    HIstory of Sleep apnea    reports no longer has since losing weight   Hx of exercise stress test    reports  had done to eval since coronary artery stent was placed ; reports test was "normal"    Hyperlipidemia    Left leg numbness 11/22/2016   Malignant neoplasm of prostate ([place]) 06/30/2021   Mixed hyperlipidemia 11/24/2009   Overview:  IMPRESSION: Check labs today.  Continue Pravachol 40 mg daily.   Mixed sleep apnea 03/30/2011   Parkinson's disease ([place])  02/08/2021   Partial epilepsy with impairment of consciousness ([place]) 12/18/2013   Polypharmacy 12/18/2013   Prostate cancer ([place])    PSVT (paroxysmal supraventricular tachycardia) ([place])    reports no knowledge of this    Right hand Tremors of nervous system    Seizure disorder ([place]) 05/10/2011   Seizures ([place])    last 2015    Past Surgical History:  Procedure Laterality Date   CARDIAC CATHETERIZATION  2015   COLONOSCOPY     2021   CORONARY ANGIOPLASTY  2015   CORONARY STENT PLACEMENT  2015   x2. Mid LAD   INGUINAL  HERNIA REPAIR Bilateral 09/26/2017   Procedure: LAPAROSCOPIC BILATERAL INGUINAL HERNIA REPAIR;  Surgeon: Michael Boston, MD;  Location: [facility];  Service: General;  Laterality: Bilateral;   INSERTION OF MESH Bilateral 09/26/2017   Procedure: INSERTION OF MESH;  Surgeon: Michael Boston, MD;  Location: [facility];  Service: General;  Laterality: Bilateral;   RADIOACTIVE SEED IMPLANT N/A 11/24/2021   Procedure: RADIOACTIVE SEED IMPLANT/BRACHYTHERAPY IMPLANT;  Surgeon: Franchot Gallo, MD;  Location: [facility];  Service: Urology;  Laterality: N/A;   right eye cornea surgery  11/03/2021   @ [facility]   SPACE OAR INSTILLATION N/A 11/24/2021   Procedure: SPACE OAR INSTILLATION;  Surgeon: Franchot Gallo, MD;  Location: [facility];  Service: Urology;  Laterality: N/A;    Current Medications: Current Meds  Medication Sig   aspirin EC 81 MG tablet Take 81 mg by mouth daily.    atorvastatin (LIPITOR) 80 MG tablet Take 1 tablet (80 mg total) by mouth at bedtime.   cyanocobalamin 100 MCG tablet Take 1 tablet by mouth daily. Vitamin b   ezetimibe (ZETIA) 10 MG tablet Take 1 tablet (10 mg total) by mouth daily.   lamoTRIgine (LAMICTAL) 150 MG tablet Take 1 tablet (150 mg total) by mouth 2 (two) times daily.   latanoprost (XALATAN) 0.005 % ophthalmic solution Place 1 drop into both eyes at bedtime.   linaclotide (LINZESS) 290 MCG CAPS capsule Take 1 capsule (290 mcg total) by mouth daily before breakfast.   OVER THE COUNTER MEDICATION Place 1 drop into both eyes at bedtime. Eye gel [place] Place 1 drop into both eyes 3 (three) times daily. Blink eye drop   tamsulosin (FLOMAX) 0.4 MG CAPS capsule Take 0.4 mg by mouth daily after supper.   timolol (TIMOPTIC) 0.5 % ophthalmic solution Place 1 drop into both eyes daily.     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  Tobacco Use   Smoking status: Never    Passive exposure: Never   Smokeless tobacco: Never  Vaping Use   Vaping Use: Never used  Substance and Sexual Activity   Alcohol use: No   Drug use: No   Sexual activity: Yes  Other Topics Concern   Not on file  Social History Narrative   Lives with wife   Caffeine use: 1 cup corfee daily   Left handed    Social Determinants of Health   Financial Resource Strain: Low Risk  (12/05/2021)   Overall Financial Resource Strain (CARDIA)    Difficulty of Paying Living Expenses:  Not hard at all  Food Insecurity: No Food Insecurity (12/05/2021)   Hunger Vital Sign    Worried About Running Out of Food in the Last Year: Never true    Ran Out of Food in the Last Year: Never true  Transportation Needs: No Transportation Needs (12/05/2021)   PRAPARE - Hydrologist (Medical): No    Lack of Transportation (Non-Medical): No  Physical Activity: Sufficiently Active (12/05/2021)   Exercise Vital Sign    Days of Exercise per Week: 5 days    Minutes of Exercise per Session: 30 min  Stress: No Stress Concern Present (12/05/2021)   [place]    Feeling of Stress : Not at all  Social Connections: Moderately Integrated (11/23/2020)   Social Connection and Isolation Panel [NHANES]    Frequency of Communication with Friends and Family: More than three times a week    Frequency of Social Gatherings with Friends and Family: More than three times a week    Attends Religious Services: Never    Marine scientist or Organizations: Yes    Attends Archivist Meetings: 1 to 4 times per year    Marital Status: Married     Family History: The patient's family history includes Diabetes in his  paternal uncle; Heart attack in his father; Liver disease in his mother. There is no history of Colon cancer, Esophageal cancer, Prostate cancer, Colon polyps, Rectal cancer, or Stomach cancer. ROS:   Please see the history of present illness.    All other systems reviewed and are negative.  EKGs/Labs/Other Studies Reviewed:    The following studies were reviewed today:  EKG:  EKG ordered today and personally reviewed.  The ekg ordered today demonstrates sinus rhythm normal  Recent Labs: 06/29/2021: ALT 12; Platelets 179.0; TSH 2.38 11/24/2021: BUN 33; Creatinine, Ser 1.10; Hemoglobin 13.6; Potassium 4.5; Sodium 142  Recent Lipid Panel    Component Value Date/Time   CHOL 139 06/29/2021 0828   CHOL 151 01/30/2019 0919   TRIG 79.0 06/29/2021 0828   HDL 59.20 06/29/2021 0828   HDL 67 01/30/2019 0919   CHOLHDL 2 06/29/2021 0828   VLDL 15.8 06/29/2021 0828   LDLCALC 64 06/29/2021 0828   LDLCALC 66 02/12/2020 0958    Physical Exam:    VS:  BP 120/66   Pulse 66   Ht '5\' 7"'$  (1.702 m)   Wt 147 lb (66.7 kg)   SpO2 97%   BMI 23.02 kg/m     Wt Readings from Last 3 Encounters:  01/05/22 147 lb (66.7 kg)  01/03/22 149 lb 2 oz (67.6 kg)  12/05/21 145 lb (65.8 kg)     GEN:  Well nourished, well developed in no acute distress HEENT: Normal NECK: No JVD; No carotid bruits LYMPHATICS: No lymphadenopathy CARDIAC: RRR, no murmurs, rubs, gallops RESPIRATORY:  Clear to auscultation without rales, wheezing or rhonchi  ABDOMEN: Soft, non-tender, non-distended MUSCULOSKELETAL:  No edema; No deformity  SKIN: Warm and dry NEUROLOGIC:  Alert and oriented x 3 PSYCHIATRIC:  Normal affect    Signed, Shirlee More, MD  01/05/2022 8:52 AM    [employer]

## 2022-01-09 ENCOUNTER — Ambulatory Visit: Payer: PPO | Admitting: Diagnostic Neuroimaging

## 2022-01-09 ENCOUNTER — Encounter: Payer: Self-pay | Admitting: Diagnostic Neuroimaging

## 2022-01-09 VITALS — BP 121/66 | HR 65 | Ht 67.0 in | Wt 149.0 lb

## 2022-01-09 DIAGNOSIS — G40909 Epilepsy, unspecified, not intractable, without status epilepticus: Secondary | ICD-10-CM | POA: Diagnosis not present

## 2022-01-09 DIAGNOSIS — G20 Parkinson's disease: Secondary | ICD-10-CM | POA: Diagnosis not present

## 2022-01-09 MED ORDER — LAMOTRIGINE 150 MG PO TABS: 150.0000 mg | ORAL_TABLET | Freq: Two times a day (BID) | ORAL | 4 refills | Status: DC

## 2022-01-09 NOTE — Progress Notes (Signed)
[facility]  PATIENT: Hunter Vega DOB: 31-Oct-1950  REFERRING CLINICIAN: Colon Branch, MD HISTORY FROM: patient  REASON FOR VISIT: follow up   HISTORICAL  CHIEF COMPLAINT:  Chief Complaint  Patient presents with   Parkinson's disease    Rm 6,  9 month FU  wife- [name] " my left hand tremors sometimes with exercise or anxiety; wife states he seems to have some confusion"     HISTORY OF PRESENT ILLNESS:   UPDATE (01/09/2022, VRP): Since last visit, doing well and no seizures.  Had second opinion for Parkinson's symptoms and this was confirmed as well.  Tremor continues.  He staying active with walking and cardio exercises.  Wife notes some change in focus and attention and cognitive abilities, but patient does not notice that much.  No major changes in ADLs.   PRIOR HPI: 71 year old male here for evaluation of seizures.  Patient had new onset seizure in 2012 with generalized convulsive seizures.  He is mainly correcting sleep.  He also started having daytime weird sensations and staring spells.  Initially he was treated with levetiracetam but seizures continued.  Ultimately he was changed to lamotrigine and he has not had seizures since 2015.  MRI of the brain was unremarkable.  EEG showed left frontal epileptiform discharges.  Around 2021 patient had new onset of right upper extremity resting tremor.  This was monitored over time.  He had a visit in September 2022 with Dr. Jannifer Franklin who diagnosed possible mild parkinsonism.  Patient having constipation, decreased handwriting, masked facies, generalized slow movements.  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  He has a good nutrition with adequate amounts of protein, fruits, vegetables and whole grains.  He has had some issues with mild memory loss.  No major changes in ADLs.  He has been diagnosed with sleep apnea but could not tolerate CPAP.  He does have some mild anxiety  issues.   REVIEW OF SYSTEMS: Full 14 system review of systems performed and negative with exception of: As per HPI.  ALLERGIES: Allergies  Allergen Reactions   Penicillins Swelling    Arm swelling Has patient had a PCN reaction causing immediate rash, facial/tongue/throat swelling, SOB or lightheadedness with hypotension: No Has patient had a PCN reaction causing severe rash involving mucus membranes or skin necrosis: No Has patient had a PCN reaction that required hospitalization: No Has patient had a PCN reaction occurring within the last 10 years: No If all of the above answers are "NO", then may proceed with Cephalosporin use.     HOME MEDICATIONS: Outpatient Medications Prior to Visit  Medication Sig Dispense Refill   aspirin EC 81 MG tablet Take 81 mg by mouth daily.      atorvastatin (LIPITOR) 80 MG tablet Take 1 tablet (80 mg total) by mouth at bedtime. 90 tablet 3   cyanocobalamin 100 MCG tablet Take 1 tablet by mouth daily. Vitamin b     ezetimibe (ZETIA) 10 MG tablet Take 1 tablet (10 mg total) by mouth daily. 90 tablet 3   latanoprost (XALATAN) 0.005 % ophthalmic solution Place 1 drop into both eyes at bedtime.     linaclotide (LINZESS) 290 MCG CAPS capsule Take 1 capsule (290 mcg total) by mouth daily before breakfast. 90 capsule 4   OVER THE COUNTER MEDICATION Place 1 drop into both eyes 3 (three) times daily. Blink eye drop     tamsulosin (FLOMAX) 0.4 MG CAPS  capsule Take 0.4 mg by mouth daily after supper.     timolol (TIMOPTIC) 0.5 % ophthalmic solution Place 1 drop into both eyes daily.     lamoTRIgine (LAMICTAL) 150 MG tablet Take 1 tablet (150 mg total) by mouth 2 (two) times daily. 180 tablet 4   OVER THE COUNTER MEDICATION Place 1 drop into both eyes at bedtime. Eye gel otc      No facility-administered medications prior to visit.    PAST MEDICAL HISTORY: Past Medical History:  Diagnosis Date   Allergy    mild seasonal   Annual physical exam 12/18/2013    BCC (basal cell carcinoma of skin)    Bilateral inguinal hernia s/p lap repair with mesh 09/26/2017 09/26/2017   CAD (coronary artery disease) 11/04/2015   Constipation    started Linzess monday 08-12-2018    Corneal intraepithelial neoplasia 08/03/2021   Formatting of this note might be different from the original. Added automatically from request for surgery [id number]   Glaucoma both eyes    History of seizure    reports no seizure since 2015    HIstory of Sleep apnea    reports no longer has since losing weight   Hx of exercise stress test    reports  had done to eval since coronary artery stent was placed ; reports test was "normal"    Hyperlipidemia    Left leg numbness 11/22/2016   Malignant neoplasm of prostate ([place]) 06/30/2021   Mixed hyperlipidemia 11/24/2009   Overview:  IMPRESSION: Check labs today.  Continue Pravachol 40 mg daily.   Mixed sleep apnea 03/30/2011   Parkinson's disease ([place]) 02/08/2021   Partial epilepsy with impairment of consciousness ([place]) 12/18/2013   Polypharmacy 12/18/2013   Prostate cancer ([place])    PSVT (paroxysmal supraventricular tachycardia) ([place])    reports no knowledge of this    Right hand Tremors of nervous system    Seizure disorder ([place]) 05/10/2011   Seizures ([place])    last 2015    PAST SURGICAL HISTORY: Past Surgical History:  Procedure Laterality Date   CARDIAC CATHETERIZATION  2015   COLONOSCOPY     2021   CORONARY ANGIOPLASTY  2015   CORONARY STENT PLACEMENT  2015   x2. Mid LAD   INGUINAL HERNIA REPAIR Bilateral 09/26/2017   Procedure: LAPAROSCOPIC BILATERAL INGUINAL HERNIA REPAIR;  Surgeon: Michael Boston, MD;  Location: [facility];  Service: General;  Laterality: Bilateral;   INSERTION OF MESH Bilateral 09/26/2017   Procedure: INSERTION OF MESH;  Surgeon: Michael Boston, MD;  Location: [facility];  Service: General;  Laterality: Bilateral;   RADIOACTIVE SEED IMPLANT N/A 11/24/2021   Procedure: RADIOACTIVE SEED IMPLANT/BRACHYTHERAPY IMPLANT;   Surgeon: Franchot Gallo, MD;  Location: [facility];  Service: Urology;  Laterality: N/A;   right eye cornea surgery  11/03/2021   @ [facility]   SPACE OAR INSTILLATION N/A 11/24/2021   Procedure: SPACE OAR INSTILLATION;  Surgeon: Franchot Gallo, MD;  Location: [facility];  Service: Urology;  Laterality: N/A;    FAMILY HISTORY: Family History  Problem Relation Age of Onset   Liver disease Mother    Heart attack Father    Diabetes Paternal Uncle    Colon cancer Neg Hx    Esophageal cancer Neg Hx    Prostate cancer Neg Hx    Colon polyps Neg Hx    Rectal cancer Neg Hx    Stomach cancer Neg Hx     SOCIAL HISTORY: Social History  Socioeconomic History   Marital status: Married    Spouse name: [name]   Number of children: 3   Years of education: Not on file   Highest education level: Not on file  Occupational History   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  Tobacco Use   Smoking status: Never    Passive exposure: Never   Smokeless tobacco: Never  Vaping Use   Vaping Use: Never used  Substance and Sexual Activity   Alcohol use: No   Drug use: No   Sexual activity: Yes  Other Topics Concern   Not on file  Social History Narrative   Lives with wife   Caffeine use: 1 cup corfee daily   Left handed    Social Determinants of Health   Financial Resource Strain: Low Risk  (12/05/2021)   Overall Financial Resource Strain (CARDIA)    Difficulty of Paying Living Expenses: Not hard at all  Food Insecurity: No Food Insecurity (12/05/2021)   Hunger Vital Sign    Worried About Running Out of Food in the Last Year: Never true    [place] in the Last Year: Never true  Transportation Needs: No Transportation Needs (12/05/2021)   PRAPARE - Hydrologist (Medical): No    Lack of Transportation (Non-Medical): No  Physical Activity: Sufficiently Active (12/05/2021)   Exercise Vital Sign    Days  of Exercise per Week: 5 days    Minutes of Exercise per Session: 30 min  Stress: No Stress Concern Present (12/05/2021)   [place]    Feeling of Stress : Not at all  Social Connections: Moderately Integrated (11/23/2020)   Social Connection and Isolation Panel [NHANES]    Frequency of Communication with Friends and Family: More than three times a week    Frequency of Social Gatherings with Friends and Family: More than three times a week    Attends Religious Services: Never    Marine scientist or Organizations: Yes    Attends Archivist Meetings: 1 to 4 times per year    Marital Status: Married  Human resources officer Violence: Not At Risk (12/05/2021)   Humiliation, Afraid, Rape, and Kick questionnaire    Fear of Current or Ex-Partner: No    Emotionally Abused: No    Physically Abused: No    Sexually Abused: No     PHYSICAL EXAM  GENERAL EXAM/CONSTITUTIONAL: Vitals:  Vitals:   01/09/22 1557  BP: 121/66  Pulse: 65  Weight: 149 lb (67.6 kg)  Height: '5\' 7"'$  (1.702 m)   Body mass index is 23.34 kg/m. Wt Readings from Last 3 Encounters:  01/09/22 149 lb (67.6 kg)  01/05/22 147 lb (66.7 kg)  01/03/22 149 lb 2 oz (67.6 kg)   Patient is in no distress; well developed, nourished and groomed; neck is supple  CARDIOVASCULAR: Examination of carotid arteries is normal; no carotid bruits Regular rate and rhythm, no murmurs Examination of peripheral vascular system by observation and palpation is normal  EYES: Ophthalmoscopic exam of optic discs and posterior segments is normal; no papilledema or hemorrhages No results found.  MUSCULOSKELETAL: Gait, strength, tone, movements noted in Neurologic exam below  NEUROLOGIC: MENTAL STATUS:      No data to display         awake, alert, oriented to person, place and time recent and remote memory intact normal attention and concentration language  fluent, comprehension intact,  naming intact fund of knowledge appropriate  CRANIAL NERVE:  2nd - no papilledema on fundoscopic exam 2nd, 3rd, 4th, 6th - pupils equal and reactive to light, visual fields full to confrontation, extraocular muscles intact, no nystagmus 5th - facial sensation symmetric 7th - facial strength symmetric 8th - hearing intact 9th - palate elevates symmetrically, uvula midline 11th - shoulder shrug symmetric 12th - tongue protrusion midline MILD MASKED FACIES  MOTOR:  INTERMITTENT RESTING TREMOR IN RUE NO RIGIDITY MILD BRADYKINESIA IN BUE normal bulk and tone, full strength in the BUE, BLE  SENSORY:  normal and symmetric to light touch, pinprick, temperature, vibration  COORDINATION:  finger-nose-finger, fine finger movements normal; MILD ACTION TREMOR IN BUE  REFLEXES:  deep tendon reflexes 2+ and symmetric  GAIT/STATION:  narrow based gait; DECR ARM SWING ON RIGHT > LEFT; MILD TREMOR IN RUE WITH WALKING     DIAGNOSTIC DATA (LABS, IMAGING, TESTING) - I reviewed patient records, labs, notes, testing and imaging myself where available.  Lab Results  Component Value Date   WBC 5.3 06/29/2021   HGB 13.6 11/24/2021   HCT 40.0 11/24/2021   MCV 95.5 06/29/2021   PLT 179.0 06/29/2021      Component Value Date/Time   NA 142 11/24/2021 0556   NA 143 01/30/2019 0919   K 4.5 11/24/2021 0556   CL 104 11/24/2021 0556   CO2 32 06/29/2021 0828   GLUCOSE 95 11/24/2021 0556   BUN 33 (H) 11/24/2021 0556   BUN 20 01/30/2019 0919   CREATININE 1.10 11/24/2021 0556   CREATININE 1.06 02/12/2020 0958   CALCIUM 9.8 06/29/2021 0828   PROT 6.9 06/29/2021 0828   PROT 6.8 01/30/2019 0919   ALBUMIN 4.4 06/29/2021 0828   ALBUMIN 4.6 01/30/2019 0919   AST 16 06/29/2021 0828   ALT 12 06/29/2021 0828   ALKPHOS 77 06/29/2021 0828   BILITOT 1.1 06/29/2021 0828   BILITOT 0.8 01/30/2019 0919   GFRNONAA 64 01/30/2019 0919   GFRAA 74 01/30/2019 0919   Lab  Results  Component Value Date   CHOL 139 06/29/2021   HDL 59.20 06/29/2021   LDLCALC 64 06/29/2021   TRIG 79.0 06/29/2021   CHOLHDL 2 06/29/2021   Lab Results  Component Value Date   HGBA1C 5.8 12/30/2020   Lab Results  Component Value Date   VITAMINB12 280 06/29/2021   Lab Results  Component Value Date   TSH 2.38 06/29/2021     02/05/13 EEG Interpretation : This is an abnormal awake, drowsy and sleep routine adult EEG due to occasional left lateral frontal spikes.   07/14/19 MRI of the brain without contrast shows the following: [I reviewed images myself and agree with interpretation. -VRP]  1.  Scattered T2/FLAIR hyperintense foci in the subcortical and deep white matter.  None of these appear to be acute and most were present on the MRI from 2014.  This is most consistent with mild chronic microvascular ischemic change.  Demyelination or vasculitis is less likely. 2.   Brain volume is normal for age. 3.   There are no acute findings.    ASSESSMENT AND PLAN  71 y.o. year old male here with:   Dx:  1. Parkinson's disease ([place])   2. Seizure disorder ([place])       PLAN:  SEIZURE DISORDER (complex partial seizure disorder; 1st seizure 2012; last sz 2015; meds tried: levetiracetam) - doing well on lamotrigine '150mg'$  twice a day   RIGHT UPPER EXTREMITY RESTING TREMOR / bradykinesia /  decreased arm swing / masked facies (likely idiopathic parkinson's disease) - monitor symptoms; optimize nutrition, exercise - may consider amantadine, rasagiline, carb/levo in future  Meds ordered this encounter  Medications   lamoTRIgine (LAMICTAL) 150 MG tablet    Sig: Take 1 tablet (150 mg total) by mouth 2 (two) times daily.    Dispense:  180 tablet    Refill:  4   Return in about 10 months (around 11/10/2022).    Penni Bombard, MD 2/95/6213, 0:86 PM Certified in Neurology, Neurophysiology and Neuroimaging  [facility] [address] [place], [place] [address] [phone]

## 2022-01-10 ENCOUNTER — Encounter: Payer: Self-pay | Admitting: *Deleted

## 2022-01-11 DIAGNOSIS — H2513 Age-related nuclear cataract, bilateral: Secondary | ICD-10-CM

## 2022-01-11 HISTORY — DX: Age-related nuclear cataract, bilateral: H25.13

## 2022-01-11 NOTE — Progress Notes (Signed)
  Radiation Oncology         [phone] ________________________________  Name: Hunter Vega MRN: [id number]  Date: 12/26/2021  DOB: 02/13/1951  3D Planning Note   Prostate Brachytherapy Post-Implant Dosimetry  Diagnosis: 71 y.o. gentleman with Stage T1c adenocarcinoma of the prostate with Gleason score of 3+4, and PSA of 5.47.  Narrative: On a previous date, Hunter Vega returned following prostate seed implantation for post implant planning. He underwent CT scan complex simulation to delineate the three-dimensional structures of the pelvis and demonstrate the radiation distribution.  Since that time, the seed localization, and complex isodose planning with dose volume histograms have now been completed.  Results:   Prostate Coverage - The dose of radiation delivered to the 90% or more of the prostate gland (D90) was 110.24% of the prescription dose. This exceeds our goal of greater than 90%. Rectal Sparing - The volume of rectal tissue receiving the prescription dose or higher was 0.0 cc. This falls under our thresholds tolerance of 1.0 cc.  Impression: The prostate seed implant appears to show adequate target coverage and appropriate rectal sparing.  Plan:  The patient will continue to follow with urology for ongoing PSA determinations. I would anticipate a high likelihood for local tumor control with minimal risk for rectal morbidity.  ________________________________  Sheral Apley Tammi Klippel, M.D.

## 2022-01-16 ENCOUNTER — Encounter: Payer: Self-pay | Admitting: *Deleted

## 2022-01-16 NOTE — Progress Notes (Signed)
  Mailed SCP treatment summary.

## 2022-01-17 DIAGNOSIS — R3914 Feeling of incomplete bladder emptying: Secondary | ICD-10-CM | POA: Diagnosis not present

## 2022-01-17 DIAGNOSIS — R3912 Poor urinary stream: Secondary | ICD-10-CM | POA: Diagnosis not present

## 2022-02-07 ENCOUNTER — Ambulatory Visit: Payer: PPO | Admitting: Diagnostic Neuroimaging

## 2022-02-07 ENCOUNTER — Encounter: Payer: Self-pay | Admitting: *Deleted

## 2022-02-07 ENCOUNTER — Inpatient Hospital Stay: Payer: PPO | Admitting: *Deleted

## 2022-02-07 DIAGNOSIS — C61 Malignant neoplasm of prostate: Secondary | ICD-10-CM

## 2022-02-07 NOTE — Progress Notes (Signed)
SCP reviewed and completed. 

## 2022-02-09 DIAGNOSIS — D1801 Hemangioma of skin and subcutaneous tissue: Secondary | ICD-10-CM | POA: Diagnosis not present

## 2022-02-09 DIAGNOSIS — D044 Carcinoma in situ of skin of scalp and neck: Secondary | ICD-10-CM | POA: Diagnosis not present

## 2022-02-09 DIAGNOSIS — Z85828 Personal history of other malignant neoplasm of skin: Secondary | ICD-10-CM | POA: Diagnosis not present

## 2022-02-09 DIAGNOSIS — L814 Other melanin hyperpigmentation: Secondary | ICD-10-CM | POA: Diagnosis not present

## 2022-02-09 DIAGNOSIS — L821 Other seborrheic keratosis: Secondary | ICD-10-CM | POA: Diagnosis not present

## 2022-02-09 DIAGNOSIS — L570 Actinic keratosis: Secondary | ICD-10-CM | POA: Diagnosis not present

## 2022-02-15 ENCOUNTER — Encounter: Payer: Self-pay | Admitting: Internal Medicine

## 2022-02-17 DIAGNOSIS — M79671 Pain in right foot: Secondary | ICD-10-CM | POA: Diagnosis not present

## 2022-02-17 DIAGNOSIS — M216X1 Other acquired deformities of right foot: Secondary | ICD-10-CM | POA: Diagnosis not present

## 2022-02-17 DIAGNOSIS — M779 Enthesopathy, unspecified: Secondary | ICD-10-CM | POA: Diagnosis not present

## 2022-02-17 DIAGNOSIS — G5761 Lesion of plantar nerve, right lower limb: Secondary | ICD-10-CM | POA: Diagnosis not present

## 2022-03-09 DIAGNOSIS — Z955 Presence of coronary angioplasty implant and graft: Secondary | ICD-10-CM | POA: Diagnosis not present

## 2022-03-09 DIAGNOSIS — H25811 Combined forms of age-related cataract, right eye: Secondary | ICD-10-CM | POA: Diagnosis not present

## 2022-03-09 DIAGNOSIS — H25813 Combined forms of age-related cataract, bilateral: Secondary | ICD-10-CM | POA: Diagnosis not present

## 2022-03-09 DIAGNOSIS — G40909 Epilepsy, unspecified, not intractable, without status epilepticus: Secondary | ICD-10-CM | POA: Diagnosis not present

## 2022-03-09 DIAGNOSIS — I2510 Atherosclerotic heart disease of native coronary artery without angina pectoris: Secondary | ICD-10-CM | POA: Diagnosis not present

## 2022-03-14 DIAGNOSIS — C61 Malignant neoplasm of prostate: Secondary | ICD-10-CM | POA: Diagnosis not present

## 2022-03-14 LAB — PSA: PSA: 0.41

## 2022-03-15 DIAGNOSIS — M79671 Pain in right foot: Secondary | ICD-10-CM | POA: Diagnosis not present

## 2022-03-15 DIAGNOSIS — G5761 Lesion of plantar nerve, right lower limb: Secondary | ICD-10-CM | POA: Diagnosis not present

## 2022-03-21 DIAGNOSIS — C61 Malignant neoplasm of prostate: Secondary | ICD-10-CM | POA: Diagnosis not present

## 2022-03-23 ENCOUNTER — Encounter: Payer: Self-pay | Admitting: Internal Medicine

## 2022-03-23 DIAGNOSIS — H25812 Combined forms of age-related cataract, left eye: Secondary | ICD-10-CM | POA: Diagnosis not present

## 2022-03-23 DIAGNOSIS — H2513 Age-related nuclear cataract, bilateral: Secondary | ICD-10-CM | POA: Diagnosis not present

## 2022-04-06 DIAGNOSIS — G5761 Lesion of plantar nerve, right lower limb: Secondary | ICD-10-CM | POA: Diagnosis not present

## 2022-04-06 DIAGNOSIS — M79671 Pain in right foot: Secondary | ICD-10-CM | POA: Diagnosis not present

## 2022-04-06 DIAGNOSIS — M779 Enthesopathy, unspecified: Secondary | ICD-10-CM | POA: Diagnosis not present

## 2022-04-06 DIAGNOSIS — M216X1 Other acquired deformities of right foot: Secondary | ICD-10-CM | POA: Diagnosis not present

## 2022-04-06 DIAGNOSIS — H401131 Primary open-angle glaucoma, bilateral, mild stage: Secondary | ICD-10-CM

## 2022-04-06 HISTORY — DX: Primary open-angle glaucoma, bilateral, mild stage: H40.1131

## 2022-04-07 DIAGNOSIS — H04123 Dry eye syndrome of bilateral lacrimal glands: Secondary | ICD-10-CM

## 2022-04-07 HISTORY — DX: Dry eye syndrome of bilateral lacrimal glands: H04.123

## 2022-05-12 ENCOUNTER — Other Ambulatory Visit: Payer: Self-pay | Admitting: Cardiology

## 2022-05-12 DIAGNOSIS — D485 Neoplasm of uncertain behavior of skin: Secondary | ICD-10-CM | POA: Diagnosis not present

## 2022-05-12 DIAGNOSIS — Z85828 Personal history of other malignant neoplasm of skin: Secondary | ICD-10-CM | POA: Diagnosis not present

## 2022-05-12 DIAGNOSIS — L308 Other specified dermatitis: Secondary | ICD-10-CM | POA: Diagnosis not present

## 2022-05-19 ENCOUNTER — Other Ambulatory Visit: Payer: Self-pay | Admitting: Diagnostic Neuroimaging

## 2022-05-26 ENCOUNTER — Ambulatory Visit (HOSPITAL_BASED_OUTPATIENT_CLINIC_OR_DEPARTMENT_OTHER)
Admission: RE | Admit: 2022-05-26 | Discharge: 2022-05-26 | Disposition: A | Discharge status: Home / Self Care | Payer: PPO | Source: Ambulatory Visit | Attending: Internal Medicine | Admitting: Internal Medicine

## 2022-05-26 ENCOUNTER — Ambulatory Visit (INDEPENDENT_AMBULATORY_CARE_PROVIDER_SITE_OTHER): Payer: PPO | Admitting: Internal Medicine

## 2022-05-26 ENCOUNTER — Encounter: Payer: Self-pay | Admitting: Internal Medicine

## 2022-05-26 VITALS — BP 126/64 | HR 64 | Temp 98.4°F | Resp 16 | Ht 67.0 in | Wt 150.4 lb

## 2022-05-26 DIAGNOSIS — E559 Vitamin D deficiency, unspecified: Secondary | ICD-10-CM | POA: Diagnosis not present

## 2022-05-26 DIAGNOSIS — D869 Sarcoidosis, unspecified: Secondary | ICD-10-CM | POA: Insufficient documentation

## 2022-05-26 NOTE — Patient Instructions (Addendum)
   GO TO THE LAB : Get the blood work     [place], [place] back for    your physical in a couple of months as scheduled.   STOP BY THE FIRST FLOOR:  get the XR

## 2022-05-26 NOTE — Progress Notes (Signed)
Subjective:    Patient ID: Hunter Vega, male    DOB: 25-Jul-1950, 71 y.o.   MRN: [id number]  DOS:  05/26/2022 Type of visit - description: Acute  Patient developed a rash near the R buttock, went to his dermatologist, a biopsy showed rash is possibly related to sarcoidosis.  He is here for further evaluation  He has multiple medical problems. He essentially feels at baseline. Denies fever or chills. No weight loss. No other rash.   Review of Systems See above   Past Medical History:  Diagnosis Date   Allergy    mild seasonal   Annual physical exam 12/18/2013   BCC (basal cell carcinoma of skin)    Bilateral inguinal hernia s/p lap repair with mesh 09/26/2017 09/26/2017   CAD (coronary artery disease) 11/04/2015   Constipation    started Linzess monday 08-12-2018    Corneal intraepithelial neoplasia 08/03/2021   Formatting of this note might be different from the original. Added automatically from request for surgery [id number]   Glaucoma both eyes    History of seizure    reports no seizure since 2015    HIstory of Sleep apnea    reports no longer has since losing weight   Hx of exercise stress test    reports  had done to eval since coronary artery stent was placed ; reports test was "normal"    Hyperlipidemia    Left leg numbness 11/22/2016   Malignant neoplasm of prostate ([place]) 06/30/2021   Mixed hyperlipidemia 11/24/2009   Overview:  IMPRESSION: Check labs today.  Continue Pravachol 40 mg daily.   Mixed sleep apnea 03/30/2011   Parkinson's disease 02/08/2021   Partial epilepsy with impairment of consciousness ([place]) 12/18/2013   Polypharmacy 12/18/2013   Prostate cancer ([place])    PSVT (paroxysmal supraventricular tachycardia)    reports no knowledge of this    Right hand Tremors of nervous system    Seizure disorder ([place]) 05/10/2011   Seizures ([place])    last 2015    Past Surgical History:  Procedure Laterality Date   CARDIAC CATHETERIZATION  2015   CATARACT  EXTRACTION, BILATERAL Bilateral    CORONARY ANGIOPLASTY  2015   CORONARY STENT PLACEMENT  2015   x2. Mid LAD   INGUINAL HERNIA REPAIR Bilateral 09/26/2017   Procedure: LAPAROSCOPIC BILATERAL INGUINAL HERNIA REPAIR;  Surgeon: Michael Boston, MD;  Location: [facility];  Service: General;  Laterality: Bilateral;   INSERTION OF MESH Bilateral 09/26/2017   Procedure: INSERTION OF MESH;  Surgeon: Michael Boston, MD;  Location: [facility];  Service: General;  Laterality: Bilateral;   RADIOACTIVE SEED IMPLANT N/A 11/24/2021   Procedure: RADIOACTIVE SEED IMPLANT/BRACHYTHERAPY IMPLANT;  Surgeon: Franchot Gallo, MD;  Location: [facility];  Service: Urology;  Laterality: N/A;   right eye cornea surgery  11/03/2021   @ [facility]   SPACE OAR INSTILLATION N/A 11/24/2021   Procedure: SPACE OAR INSTILLATION;  Surgeon: Franchot Gallo, MD;  Location: [facility];  Service: Urology;  Laterality: N/A;    Current Outpatient Medications  Medication Instructions   aspirin EC 81 mg, Oral, Daily   atorvastatin (LIPITOR) 80 mg, Oral, Daily at bedtime   cyanocobalamin 100 MCG tablet 1 tablet, Oral, Daily, Vitamin b   ezetimibe (ZETIA) 10 mg, Oral, Daily   lamoTRIgine (LAMICTAL) 150 mg, Oral, 2 times daily   latanoprost (XALATAN) 0.005 % ophthalmic solution 1 drop, Both Eyes, Daily at bedtime   LINZESS 290 MCG CAPS capsule TAKE 1  CAPSULE BY MOUTH ONCE DAILY BEFORE BREAKFAST   OVER THE COUNTER MEDICATION 1 drop, 3 times daily   polyethylene glycol (MIRALAX / GLYCOLAX) 17 g, Oral, Daily   tamsulosin (FLOMAX) 0.4 mg, Oral, Daily after supper   timolol (TIMOPTIC) 0.5 % ophthalmic solution 1 drop, Both Eyes, Daily       Objective:   Physical Exam BP 126/64   Pulse 64   Temp 98.4 F (36.9 C) (Oral)   Resp 16   Ht '5\' 7"'$  (1.702 m)   Wt 150 lb 6 oz (68.2 kg)   SpO2 95%   BMI 23.55 kg/m  General:   Well developed, NAD, BMI noted.  HEENT:  Normocephalic . Face symmetric,  atraumatic Lymphatic system: At both axillary areas he has a few, less than 1 cm, mobile lymph nodes.  Same findings in the groins. Lungs:  CTA B Normal respiratory effort, no intercostal retractions, no accessory muscle use. Heart: RRR,  no murmur.  Abdomen:  Not distended, soft, non-tender. No rebound or rigidity.   Skin: He has a group of round, 2 to 3 mm lesions slightly hyperpigmented, mostly macular (he already placed some hydrocortisone on the area so this is not the primary rash) located at the right buttock.  Lesions don't  cross midline Lower extremities: no pretibial edema bilaterally  Neurologic:  alert & oriented X3.  Speech normal, gait appropriate for age and unassisted Psych--  Cognition and judgment appear intact.  Cooperative with normal attention span and concentration.  Behavior appropriate. No anxious or depressed appearing.     Assessment     Assessment Hyperglycemia. High cholesterol. CAD PCI  stent LAD 2014   PSVT H/o [facility] 2019 Neurology (Dr. Jannifer Franklin) -H/o Sz -Possible Parkinson's disease Chronic constipation (GI Dr [name]) Prostate cancer: seed implant 10-2021 Opht: Glaucoma-cataracts Corneal intraepithelial neoplasm  PLAN Sarcoidosis? The patient developed a rash at the right buttock, went to his dermatologist. Bx showed>>. Sections show foreign body type of granulomas in the dermis with focal hyalinization and a peripheral rim of lymphocyte surrounding most of the granulomas.  Some of the granulomas are naked.  The pattern could possibly be seen in sarcoidosis. Also could be a infection process, PAS and Fite stains were performed and negative for atypical mycobacteria and fungi. Polarization for foreign material is negative. The patient request further evaluation.  Will start with the following: CMP, CBC, HIV, ACE level, vitamin D, ionized calcium.  Also a chest x-ray. Last EKG 12-2021 normal. Will follow-up any abnormal labs.  If further  assessment is needed, consider referral to pulmonary. Also requested B12 level.  Will do.

## 2022-05-27 NOTE — Assessment & Plan Note (Signed)
Sarcoidosis? The patient developed a rash at the right buttock, went to his dermatologist. Bx showed>>. Sections show foreign body type of granulomas in the dermis with focal hyalinization and a peripheral rim of lymphocyte surrounding most of the granulomas.  Some of the granulomas are naked.  The pattern could possibly be seen in sarcoidosis. Also could be a infection process, PAS and Fite stains were performed and negative for atypical mycobacteria and fungi. Polarization for foreign material is negative. The patient request further evaluation.  Will start with the following: CMP, CBC, HIV, ACE level, vitamin D, ionized calcium.  Also a chest x-ray. Last EKG 12-2021 normal. Will follow-up any abnormal labs.  If further assessment is needed, consider referral to pulmonary. Also requested B12 level.  Will do.

## 2022-05-29 ENCOUNTER — Encounter: Payer: Self-pay | Admitting: Internal Medicine

## 2022-05-30 ENCOUNTER — Telehealth: Payer: Self-pay | Admitting: Diagnostic Neuroimaging

## 2022-05-30 ENCOUNTER — Ambulatory Visit: Payer: PPO | Admitting: Diagnostic Neuroimaging

## 2022-05-30 ENCOUNTER — Encounter: Payer: Self-pay | Admitting: Diagnostic Neuroimaging

## 2022-05-30 VITALS — BP 153/65 | HR 62 | Ht 68.0 in | Wt 152.0 lb

## 2022-05-30 DIAGNOSIS — G20A1 Parkinson's disease without dyskinesia, without mention of fluctuations: Secondary | ICD-10-CM | POA: Diagnosis not present

## 2022-05-30 LAB — COMPREHENSIVE METABOLIC PANEL
AG Ratio: 1.8 (calc) (ref 1.0–2.5)
ALT: 16 U/L (ref 9–46)
AST: 16 U/L (ref 10–35)
Albumin: 4.6 g/dL (ref 3.6–5.1)
Alkaline phosphatase (APISO): 69 U/L (ref 35–144)
BUN: 24 mg/dL (ref 7–25)
CO2: 25 mmol/L (ref 20–32)
Calcium: 9.8 mg/dL (ref 8.6–10.3)
Chloride: 105 mmol/L (ref 98–110)
Creat: 1.15 mg/dL (ref 0.70–1.28)
Globulin: 2.5 g/dL (calc) (ref 1.9–3.7)
Glucose, Bld: 83 mg/dL (ref 65–99)
Potassium: 4.8 mmol/L (ref 3.5–5.3)
Sodium: 141 mmol/L (ref 135–146)
Total Bilirubin: 0.7 mg/dL (ref 0.2–1.2)
Total Protein: 7.1 g/dL (ref 6.1–8.1)

## 2022-05-30 LAB — CBC WITH DIFFERENTIAL/PLATELET
Absolute Monocytes: 650 cells/uL (ref 200–950)
Basophils Absolute: 58 cells/uL (ref 0–200)
Basophils Relative: 1 %
Eosinophils Absolute: 209 cells/uL (ref 15–500)
Eosinophils Relative: 3.6 %
HCT: 37.4 % — ABNORMAL LOW (ref 38.5–50.0)
Hemoglobin: 12.7 g/dL — ABNORMAL LOW (ref 13.2–17.1)
Lymphs Abs: 1125 cells/uL (ref 850–3900)
MCH: 32.7 pg (ref 27.0–33.0)
MCHC: 34 g/dL (ref 32.0–36.0)
MCV: 96.4 fL (ref 80.0–100.0)
MPV: 12.7 fL — ABNORMAL HIGH (ref 7.5–12.5)
Monocytes Relative: 11.2 %
Neutro Abs: 3758 cells/uL (ref 1500–7800)
Neutrophils Relative %: 64.8 %
Platelets: 179 10*3/uL (ref 140–400)
RBC: 3.88 10*6/uL — ABNORMAL LOW (ref 4.20–5.80)
RDW: 12 % (ref 11.0–15.0)
Total Lymphocyte: 19.4 %
WBC: 5.8 10*3/uL (ref 3.8–10.8)

## 2022-05-30 LAB — HIV ANTIBODY (ROUTINE TESTING W REFLEX): HIV 1&2 Ab, 4th Generation: NONREACTIVE

## 2022-05-30 LAB — VITAMIN D 25 HYDROXY (VIT D DEFICIENCY, FRACTURES): Vit D, 25-Hydroxy: 28 ng/mL — ABNORMAL LOW (ref 30–100)

## 2022-05-30 LAB — CALCIUM, IONIZED: Calcium, Ion: 5 mg/dL (ref 4.7–5.5)

## 2022-05-30 LAB — B12 AND FOLATE PANEL
Folate: >24 ng/mL
Vitamin B-12: 1607 pg/mL — ABNORMAL HIGH (ref 200–1100)

## 2022-05-30 LAB — ANGIOTENSIN CONVERTING ENZYME: Angiotensin-Converting Enzyme: 28 U/L (ref 9–67)

## 2022-05-30 MED ORDER — LINACLOTIDE 290 MCG PO CAPS: 290.0000 ug | ORAL_CAPSULE | Freq: Every day | ORAL | 4 refills | Status: DC

## 2022-05-30 MED ORDER — CARBIDOPA-LEVODOPA 25-100 MG PO TABS: 0.5000 | ORAL_TABLET | Freq: Three times a day (TID) | ORAL | 6 refills | Status: DC

## 2022-05-30 MED ORDER — VITAMIN D (ERGOCALCIFEROL) 1.25 MG (50000 UNIT) PO CAPS: 50000.0000 [IU] | ORAL_CAPSULE | ORAL | 0 refills | Status: AC

## 2022-05-30 MED ORDER — LAMOTRIGINE 150 MG PO TABS: 150.0000 mg | ORAL_TABLET | Freq: Two times a day (BID) | ORAL | 4 refills | Status: DC

## 2022-05-30 NOTE — Progress Notes (Signed)
[facility]  PATIENT: Hunter Vega DOB: 10/26/1950  REFERRING CLINICIAN: Colon Branch, MD HISTORY FROM: patient  REASON FOR VISIT: follow up   HISTORICAL  CHIEF COMPLAINT:  Chief Complaint  Patient presents with   Follow-up    Pt with wife, rm 20. Overall stable . He thinks there is some tremors in the left arm. He states this is something new since last visit. There have been some concerns per wife of brain fog over the last couple months. States would like to discuss if there are medications that may help. They note in general he is moving slower.    Other    They are anticipating moving in June. They will need a referral entered in to [facility] neurology Dr Wille Glaser Higginbotham     HISTORY OF PRESENT ILLNESS:   UPDATE (05/30/21, VRP): Since last visit, doing about the same. Some issues with brain fog and communication. Tremor stable. Planning to move to [place] in the spring.   UPDATE (01/09/2022, VRP): Since last visit, doing well and no seizures.  Had second opinion for Parkinson's symptoms and this was confirmed as well.  Tremor continues.  He staying active with walking and cardio exercises.  Wife notes some change in focus and attention and cognitive abilities, but patient does not notice that much.  No major changes in ADLs.   PRIOR HPI: 72 year old male here for evaluation of seizures.  Patient had new onset seizure in 2012 with generalized convulsive seizures.  He is mainly correcting sleep.  He also started having daytime weird sensations and staring spells.  Initially he was treated with levetiracetam but seizures continued.  Ultimately he was changed to lamotrigine and he has not had seizures since 2015.  MRI of the brain was unremarkable.  EEG showed left frontal epileptiform discharges.  Around 2021 patient had new onset of right upper extremity resting tremor.  This was monitored over time.  He had a visit in September 2022 with Dr. Jannifer Franklin who diagnosed  possible mild parkinsonism.  Patient having constipation, decreased handwriting, masked facies, generalized slow movements.  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  He has a good nutrition with adequate amounts of protein, fruits, vegetables and whole grains.  He has had some issues with mild memory loss.  No major changes in ADLs.  He has been diagnosed with sleep apnea but could not tolerate CPAP.  He does have some mild anxiety issues.   REVIEW OF SYSTEMS: Full 14 system review of systems performed and negative with exception of: As per HPI.  ALLERGIES: Allergies  Allergen Reactions   Penicillins Swelling    Arm swelling Has patient had a PCN reaction causing immediate rash, facial/tongue/throat swelling, SOB or lightheadedness with hypotension: No Has patient had a PCN reaction causing severe rash involving mucus membranes or skin necrosis: No Has patient had a PCN reaction that required hospitalization: No Has patient had a PCN reaction occurring within the last 10 years: No If all of the above answers are "NO", then may proceed with Cephalosporin use.     HOME MEDICATIONS: Outpatient Medications Prior to Visit  Medication Sig Dispense Refill   aspirin EC 81 MG tablet Take 81 mg by mouth daily.      atorvastatin (LIPITOR) 80 MG tablet Take 1 tablet (80 mg total) by mouth at bedtime. 90 tablet 1   ezetimibe (ZETIA) 10 MG tablet Take 1 tablet (10 mg total) by  mouth daily. 90 tablet 3   latanoprost (XALATAN) 0.005 % ophthalmic solution Place 1 drop into both eyes at bedtime.     OVER THE COUNTER MEDICATION Place 1 drop into both eyes 3 (three) times daily. Blink eye drop     polyethylene glycol (MIRALAX / GLYCOLAX) 17 g packet Take 17 g by mouth daily.     tamsulosin (FLOMAX) 0.4 MG CAPS capsule Take 0.4 mg by mouth daily after supper.     timolol (TIMOPTIC) 0.5 % ophthalmic solution Place 1 drop into both eyes daily.     Vitamin D,  Ergocalciferol, (DRISDOL) 1.25 MG (50000 UNIT) CAPS capsule Take 1 capsule (50,000 Units total) by mouth every 7 (seven) days. 12 capsule 0   lamoTRIgine (LAMICTAL) 150 MG tablet Take 1 tablet (150 mg total) by mouth 2 (two) times daily. 180 tablet 4   LINZESS 290 MCG CAPS capsule TAKE 1 CAPSULE BY MOUTH ONCE DAILY BEFORE BREAKFAST (Patient taking differently: Take 290 mcg by mouth daily as needed.) 90 capsule 0   cyanocobalamin 100 MCG tablet Take 1 tablet by mouth daily. Vitamin b     No facility-administered medications prior to visit.    PAST MEDICAL HISTORY: Past Medical History:  Diagnosis Date   Allergy    mild seasonal   Annual physical exam 12/18/2013   BCC (basal cell carcinoma of skin)    Bilateral inguinal hernia s/p lap repair with mesh 09/26/2017 09/26/2017   CAD (coronary artery disease) 11/04/2015   Constipation    started Linzess monday 08-12-2018    Corneal intraepithelial neoplasia 08/03/2021   Formatting of this note might be different from the original. Added automatically from request for surgery [id number]   Glaucoma both eyes    History of seizure    reports no seizure since 2015    HIstory of Sleep apnea    reports no longer has since losing weight   Hx of exercise stress test    reports  had done to eval since coronary artery stent was placed ; reports test was "normal"    Hyperlipidemia    Left leg numbness 11/22/2016   Malignant neoplasm of prostate ([place]) 06/30/2021   Mixed hyperlipidemia 11/24/2009   Overview:  IMPRESSION: Check labs today.  Continue Pravachol 40 mg daily.   Mixed sleep apnea 03/30/2011   Parkinson's disease 02/08/2021   Partial epilepsy with impairment of consciousness ([place]) 12/18/2013   Polypharmacy 12/18/2013   Prostate cancer ([place])    PSVT (paroxysmal supraventricular tachycardia)    reports no knowledge of this    Right hand Tremors of nervous system    Seizure disorder ([place]) 05/10/2011   Seizures ([place])    last 2015    PAST SURGICAL  HISTORY: Past Surgical History:  Procedure Laterality Date   CARDIAC CATHETERIZATION  2015   CATARACT EXTRACTION, BILATERAL Bilateral    CORONARY ANGIOPLASTY  2015   CORONARY STENT PLACEMENT  2015   x2. Mid LAD   INGUINAL HERNIA REPAIR Bilateral 09/26/2017   Procedure: LAPAROSCOPIC BILATERAL INGUINAL HERNIA REPAIR;  Surgeon: Michael Boston, MD;  [facility];  Service: General;  Laterality: Bilateral;   INSERTION OF MESH Bilateral 09/26/2017   Procedure: INSERTION OF MESH;  Surgeon: Michael Boston, MD;  Location: [facility];  Service: General;  Laterality: Bilateral;   RADIOACTIVE SEED IMPLANT N/A 11/24/2021   Procedure: RADIOACTIVE SEED IMPLANT/BRACHYTHERAPY IMPLANT;  Surgeon: Franchot Gallo, MD;  Location: [facility];  Service: Urology;  Laterality: N/A;   right eye  cornea surgery  11/03/2021   @ [facility]   SPACE OAR INSTILLATION N/A 11/24/2021   Procedure: SPACE OAR INSTILLATION;  Surgeon: Franchot Gallo, MD;  Location: [facility];  Service: Urology;  Laterality: N/A;    FAMILY HISTORY: Family History  Problem Relation Age of Onset   Liver disease Mother    Heart attack Father    Diabetes Paternal Uncle    Colon cancer Neg Hx    Esophageal cancer Neg Hx    Prostate cancer Neg Hx    Colon polyps Neg Hx    Rectal cancer Neg Hx    Stomach cancer Neg Hx     SOCIAL HISTORY: Social History   Socioeconomic History   Marital status: Married    Spouse name: [name]   Number of children: 3   Years of education: Not on file   Highest education level: Not on file  Occupational History   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  Tobacco Use   Smoking status: Never    Passive exposure: Never   Smokeless tobacco: Never  Vaping Use   Vaping Use: Never used  Substance and Sexual Activity   Alcohol use: No   Drug use: No   Sexual activity: Yes  Other Topics Concern   Not on file  Social History Narrative   Lives with wife    Caffeine use: 1 cup corfee daily   Left handed    Social Determinants of Health   Financial Resource Strain: Low Risk  (12/05/2021)   Overall Financial Resource Strain (CARDIA)    Difficulty of Paying Living Expenses: Not hard at all  Food Insecurity: No Food Insecurity (12/05/2021)   Hunger Vital Sign    Worried About Running Out of Food in the Last Year: Never true    Ran Out of Food in the Last Year: Never true  Transportation Needs: No Transportation Needs (12/05/2021)   PRAPARE - Hydrologist (Medical): No    Lack of Transportation (Non-Medical): No  Physical Activity: Sufficiently Active (12/05/2021)   Exercise Vital Sign    Days of Exercise per Week: 5 days    Minutes of Exercise per Session: 30 min  Stress: No Stress Concern Present (12/05/2021)   [place]    Feeling of Stress : Not at all  Social Connections: Moderately Integrated (11/23/2020)   Social Connection and Isolation Panel [NHANES]    Frequency of Communication with Friends and Family: More than three times a week    Frequency of Social Gatherings with Friends and Family: More than three times a week    Attends Religious Services: Never    Marine scientist or Organizations: Yes    Attends Archivist Meetings: 1 to 4 times per year    Marital Status: Married  Human resources officer Violence: Not At Risk (12/05/2021)   Humiliation, Afraid, Rape, and Kick questionnaire    Fear of Current or Ex-Partner: No    Emotionally Abused: No    Physically Abused: No    Sexually Abused: No     PHYSICAL EXAM  GENERAL EXAM/CONSTITUTIONAL: Vitals:  Vitals:   05/30/22 1328  BP: (!) 153/65  Pulse: 62  Weight: 152 lb (68.9 kg)  Height: '5\' 8"'$  (1.727 m)   Body mass index is 23.11 kg/m. Wt Readings from Last 3 Encounters:  05/30/22 152 lb (68.9 kg)  05/26/22 150 lb 6 oz (68.2 kg)  01/09/22 149 lb (67.6 kg)    Patient is in no distress; well developed, nourished and groomed; neck is supple  CARDIOVASCULAR: Examination of carotid arteries is normal; no carotid bruits Regular rate and rhythm, no murmurs Examination of peripheral vascular system by observation and palpation is normal  EYES: Ophthalmoscopic exam of optic discs and posterior segments is normal; no papilledema or hemorrhages No results found.  MUSCULOSKELETAL: Gait, strength, tone, movements noted in Neurologic exam below  NEUROLOGIC: MENTAL STATUS:      No data to display         awake, alert, oriented to person, place and time recent and remote memory intact normal attention and concentration language fluent, comprehension intact, naming intact fund of knowledge appropriate  CRANIAL NERVE:  2nd - no papilledema on fundoscopic exam 2nd, 3rd, 4th, 6th - pupils equal and reactive to light, visual fields full to confrontation, extraocular muscles intact, no nystagmus 5th - facial sensation symmetric 7th - facial strength symmetric 8th - hearing intact 9th - palate elevates symmetrically, uvula midline 11th - shoulder shrug symmetric 12th - tongue protrusion midline MILD MASKED FACIES  MOTOR:  INTERMITTENT RESTING TREMOR IN RUE > LUE; mild head tremor NO RIGIDITY MILD BRADYKINESIA IN BLE normal bulk and tone, full strength in the BUE, BLE  SENSORY:  normal and symmetric to light touch  COORDINATION:  finger-nose-finger, fine finger movements normal; MILD ACTION TREMOR IN BUE  REFLEXES:  deep tendon reflexes 2+ and symmetric  GAIT/STATION:  narrow based gait; DECR ARM SWING ON RIGHT > LEFT; MILD TREMOR IN RUE WITH WALKING     DIAGNOSTIC DATA (LABS, IMAGING, TESTING) - I reviewed patient records, labs, notes, testing and imaging myself where available.  Lab Results  Component Value Date   WBC 5.8 05/26/2022   HGB 12.7 (L) 05/26/2022   HCT 37.4 (L) 05/26/2022   MCV 96.4 05/26/2022   PLT 179  05/26/2022      Component Value Date/Time   NA 141 05/26/2022 1610   NA 143 01/30/2019 0919   K 4.8 05/26/2022 1610   CL 105 05/26/2022 1610   CO2 25 05/26/2022 1610   GLUCOSE 83 05/26/2022 1610   BUN 24 05/26/2022 1610   BUN 20 01/30/2019 0919   CREATININE 1.15 05/26/2022 1610   CALCIUM 9.8 05/26/2022 1610   PROT 7.1 05/26/2022 1610   PROT 6.8 01/30/2019 0919   ALBUMIN 4.4 06/29/2021 0828   ALBUMIN 4.6 01/30/2019 0919   AST 16 05/26/2022 1610   ALT 16 05/26/2022 1610   ALKPHOS 77 06/29/2021 0828   BILITOT 0.7 05/26/2022 1610   BILITOT 0.8 01/30/2019 0919   GFRNONAA 64 01/30/2019 0919   GFRAA 74 01/30/2019 0919   Lab Results  Component Value Date   CHOL 139 06/29/2021   HDL 59.20 06/29/2021   LDLCALC 64 06/29/2021   TRIG 79.0 06/29/2021   CHOLHDL 2 06/29/2021   Lab Results  Component Value Date   HGBA1C 5.8 12/30/2020   Lab Results  Component Value Date   VITAMINB12 1,607 (H) 05/26/2022   Lab Results  Component Value Date   TSH 2.38 06/29/2021     02/05/13 EEG Interpretation : This is an abnormal awake, drowsy and sleep routine adult EEG due to occasional left lateral frontal spikes.   07/14/19 MRI of the brain without contrast shows the following: [I reviewed images myself and agree with interpretation. -VRP]  1.  Scattered T2/FLAIR hyperintense foci in the subcortical and deep white matter.  None of  these appear to be acute and most were present on the MRI from 2014.  This is most consistent with mild chronic microvascular ischemic change.  Demyelination or vasculitis is less likely. 2.   Brain volume is normal for age. 3.   There are no acute findings.    ASSESSMENT AND PLAN  72 y.o. year old male here with:   Dx:  1. Parkinson's disease without dyskinesia or fluctuating manifestations     PLAN:  RIGHT UPPER EXTREMITY RESTING TREMOR / bradykinesia / decreased arm swing / masked facies (likely idiopathic parkinson's disease) - monitor symptoms;  optimize nutrition, exercise - start carbidopa / levodopa (25/100) half tab three times a day with meals x 1-2 weeks; then 1 tab three times a day with meals  SEIZURE DISORDER (complex partial seizure disorder; 1st seizure 2012; last sz 2015; meds tried: levetiracetam) - doing well on lamotrigine '150mg'$  twice a day  SARCOIDOSIS (cutaneous on back; no evidence of systemic sarcoidosis) - per dermatology; topical treatments  Meds ordered this encounter  Medications   carbidopa-levodopa (SINEMET IR) 25-100 MG tablet    Sig: Take 0.5-1 tablets by mouth 3 (three) times daily before meals.    Dispense:  90 tablet    Refill:  6   lamoTRIgine (LAMICTAL) 150 MG tablet    Sig: Take 1 tablet (150 mg total) by mouth 2 (two) times daily.    Dispense:  180 tablet    Refill:  4   linaclotide (LINZESS) 290 MCG CAPS capsule    Sig: Take 1 capsule (290 mcg total) by mouth daily before breakfast.    Dispense:  90 capsule    Refill:  4   Return for pending if symptoms worsen or fail to improve.    Penni Bombard, MD 07/05/9483, 4:62 PM Certified in Neurology, Neurophysiology and Neuroimaging  [facility] [address] [place], [place] [address] [phone]

## 2022-05-30 NOTE — Addendum Note (Signed)
Addended byDamita Dunnings D on: 05/30/2022 10:42 AM   Modules accepted: Orders

## 2022-05-30 NOTE — Telephone Encounter (Signed)
Referral for Neurology fax to [facility]. Phone: [phone], Fax: [phone].

## 2022-05-30 NOTE — Patient Instructions (Addendum)
  RIGHT UPPER EXTREMITY RESTING TREMOR / bradykinesia / decreased arm swing / masked facies (likely idiopathic parkinson's disease) - monitor symptoms; optimize nutrition, exercise - start carbidopa / levodopa (25/100) half tab three times a day with meals x 1-2 weeks; then 1 tab three times a day with meals  SEIZURE DISORDER (complex partial seizure disorder; 1st seizure 2012; last sz 2015; meds tried: levetiracetam) - doing well on lamotrigine '150mg'$  twice a day  SARCOIDOSIS (cutaneous on back; no evidence of systemic sarcoidosis) - per dermatology; topical treatments

## 2022-06-26 DIAGNOSIS — H04123 Dry eye syndrome of bilateral lacrimal glands: Secondary | ICD-10-CM | POA: Diagnosis not present

## 2022-06-26 DIAGNOSIS — D23112 Other benign neoplasm of skin of right lower eyelid, including canthus: Secondary | ICD-10-CM | POA: Diagnosis not present

## 2022-06-26 DIAGNOSIS — G20A1 Parkinson's disease without dyskinesia, without mention of fluctuations: Secondary | ICD-10-CM | POA: Diagnosis not present

## 2022-06-26 DIAGNOSIS — H401131 Primary open-angle glaucoma, bilateral, mild stage: Secondary | ICD-10-CM | POA: Diagnosis not present

## 2022-06-26 DIAGNOSIS — H5789 Other specified disorders of eye and adnexa: Secondary | ICD-10-CM | POA: Diagnosis not present

## 2022-06-26 DIAGNOSIS — H26493 Other secondary cataract, bilateral: Secondary | ICD-10-CM | POA: Diagnosis not present

## 2022-07-05 ENCOUNTER — Encounter: Payer: Self-pay | Admitting: Diagnostic Neuroimaging

## 2022-07-06 ENCOUNTER — Other Ambulatory Visit: Payer: Self-pay | Admitting: Neurology

## 2022-07-06 DIAGNOSIS — C61 Malignant neoplasm of prostate: Secondary | ICD-10-CM | POA: Diagnosis not present

## 2022-07-06 LAB — PSA: PSA: 0.23

## 2022-07-06 MED ORDER — CARBIDOPA-LEVODOPA 25-100 MG PO TABS: 0.5000 | ORAL_TABLET | Freq: Three times a day (TID) | ORAL | 1 refills | Status: DC

## 2022-07-10 DIAGNOSIS — D2310 Other benign neoplasm of skin of unspecified eyelid, including canthus: Secondary | ICD-10-CM

## 2022-07-10 HISTORY — DX: Other benign neoplasm of skin of unspecified eyelid, including canthus: D23.10

## 2022-07-11 ENCOUNTER — Ambulatory Visit (INDEPENDENT_AMBULATORY_CARE_PROVIDER_SITE_OTHER): Payer: PPO | Admitting: Internal Medicine

## 2022-07-11 ENCOUNTER — Encounter: Payer: Self-pay | Admitting: Internal Medicine

## 2022-07-11 VITALS — BP 126/76 | HR 71 | Temp 98.0°F | Resp 18 | Ht 67.0 in | Wt 149.4 lb

## 2022-07-11 DIAGNOSIS — E785 Hyperlipidemia, unspecified: Secondary | ICD-10-CM

## 2022-07-11 DIAGNOSIS — R739 Hyperglycemia, unspecified: Secondary | ICD-10-CM

## 2022-07-11 DIAGNOSIS — G20A1 Parkinson's disease without dyskinesia, without mention of fluctuations: Secondary | ICD-10-CM

## 2022-07-11 DIAGNOSIS — Z0000 Encounter for general adult medical examination without abnormal findings: Secondary | ICD-10-CM

## 2022-07-11 NOTE — Patient Instructions (Addendum)
Vaccines I recommend:  Tdap (tetanus) , RSV, Shingrix and COVID booster      GO TO THE FRONT DESK, [place] back for your fasting blood work this week  Come back for checkup in 6 months

## 2022-07-11 NOTE — Progress Notes (Unsigned)
Subjective:    Patient ID: Hunter Vega, male    DOB: December 10, 1950, 72 y.o.   MRN: [id number]  DOS:  07/11/2022 Type of visit - description: cpx  Here for CPX In general feeling well. Still has chronic constipation. Has occasional LUTS, Flomax as needed.  No gross hematuria  Review of Systems See above   Past Medical History:  Diagnosis Date   Allergy    mild seasonal   Annual physical exam 12/18/2013   BCC (basal cell carcinoma of skin)    Bilateral inguinal hernia s/p lap repair with mesh 09/26/2017 09/26/2017   CAD (coronary artery disease) 11/04/2015   Constipation    started Linzess monday 08-12-2018    Corneal intraepithelial neoplasia 08/03/2021   Formatting of this note might be different from the original. Added automatically from request for surgery [id number]   Glaucoma both eyes    History of seizure    reports no seizure since 2015    HIstory of Sleep apnea    reports no longer has since losing weight   Hx of exercise stress test    reports  had done to eval since coronary artery stent was placed ; reports test was "normal"    Hyperlipidemia    Left leg numbness 11/22/2016   Malignant neoplasm of prostate ([place]) 06/30/2021   Mixed hyperlipidemia 11/24/2009   Overview:  IMPRESSION: Check labs today.  Continue Pravachol 40 mg daily.   Mixed sleep apnea 03/30/2011   Parkinson's disease 02/08/2021   Partial epilepsy with impairment of consciousness ([place]) 12/18/2013   Polypharmacy 12/18/2013   Prostate cancer ([place])    PSVT (paroxysmal supraventricular tachycardia)    reports no knowledge of this    Right hand Tremors of nervous system    Seizure disorder ([place]) 05/10/2011   Seizures ([place])    last 2015    Past Surgical History:  Procedure Laterality Date   CARDIAC CATHETERIZATION  2015   CATARACT EXTRACTION, BILATERAL Bilateral    CORONARY ANGIOPLASTY  2015   CORONARY STENT PLACEMENT  2015   x2. Mid LAD   INGUINAL HERNIA REPAIR Bilateral 09/26/2017   Procedure:  LAPAROSCOPIC BILATERAL INGUINAL HERNIA REPAIR;  Surgeon: Michael Boston, MD;  [facility]: [facility];  Service: General;  Laterality: Bilateral;   INSERTION OF MESH Bilateral 09/26/2017   Procedure: INSERTION OF MESH;  Surgeon: Michael Boston, MD;  [facility]: [facility];  Service: General;  Laterality: Bilateral;   RADIOACTIVE SEED IMPLANT N/A 11/24/2021   Procedure: RADIOACTIVE SEED IMPLANT/BRACHYTHERAPY IMPLANT;  Surgeon: Franchot Gallo, MD;  Location: [facility];  Service: Urology;  Laterality: N/A;   right eye cornea surgery  11/03/2021   @ [facility]   SPACE OAR INSTILLATION N/A 11/24/2021   Procedure: SPACE OAR INSTILLATION;  Surgeon: Franchot Gallo, MD;  Location: [facility];  Service: Urology;  Laterality: N/A;    Current Outpatient Medications  Medication Instructions   aspirin EC 81 mg, Oral, Daily   atorvastatin (LIPITOR) 80 mg, Oral, Daily at bedtime   carbidopa-levodopa (SINEMET IR) 25-100 MG tablet 0.5-1 tablets, Oral, 3 times daily before meals   ezetimibe (ZETIA) 10 mg, Oral, Daily   lamoTRIgine (LAMICTAL) 150 mg, Oral, 2 times daily   latanoprost (XALATAN) 0.005 % ophthalmic solution 1 drop, Both Eyes, Daily at bedtime   linaclotide (LINZESS) 290 mcg, Oral, Daily before breakfast   OVER THE COUNTER MEDICATION 1 drop, Both Eyes, 3 times daily, Blink eye drop   polyethylene glycol (MIRALAX / GLYCOLAX) 17  g, Oral, Daily   tamsulosin (FLOMAX) 0.4 mg, Oral, Daily after supper   timolol (TIMOPTIC) 0.5 % ophthalmic solution 1 drop, Both Eyes, Daily   Vitamin D (Ergocalciferol) (DRISDOL) 50,000 Units, Oral, Every 7 days       Objective:   Physical Exam BP 126/76   Pulse 71   Temp 98 F (36.7 C) (Oral)   Resp 18   Ht 5' 7"$  (1.702 m)   Wt 149 lb 6 oz (67.8 kg)   SpO2 97%   BMI 23.40 kg/m  General: Well developed, NAD, BMI noted Neck: No  thyromegaly  HEENT:  Normocephalic . Face symmetric, atraumatic Lungs:  CTA B Normal respiratory  effort, no intercostal retractions, no accessory muscle use. Heart: RRR,  no murmur.  Abdomen:  Not distended, soft, non-tender. No rebound or rigidity.   Lower extremities: no pretibial edema bilaterally  Skin: Exposed areas without rash. Not pale. Not jaundice Neurologic:  alert & oriented X3.  Speech normal, gait appropriate for age and unassisted.  Needs some help transferring. Tremors noted consistent with history of Parkinson's Psych: Cognition and judgment appear intact.  Cooperative with normal attention span and concentration.  Behavior appropriate. No anxious or depressed appearing.     Assessment     Assessment Hyperglycemia. High cholesterol. CAD PCI  stent LAD 2014   PSVT H/o [facility] 2019 Neurology (Dr. Jannifer Franklin) -H/o Sz -Possible Parkinson's disease Chronic constipation (GI Dr [name]) Prostate cancer:  dx 05-2021, seed implant 10-2021 Opht: Glaucoma-cataracts Corneal intraepithelial neoplasm     PLAN Here for CPX Hyperglycemia: Check A1c High cholesterol: Continue same meds, check FLP tomorrow, fasting. CAD: Asymptomatic. Likely idiopathic Parkinson disease: Last visit with neurology 05-2022, start carbidopa levodopa.  Seizures felt to be stable.  Sarcoidosis? See last visit, question of sarcoidosis, workup did not support the dx of systemic sarcoidosis .  Chest x-ray negative. Prostate cancer: Had a biopsy 05-2021, status post brachytherapy, last visit with urology 03/21/2022  Social: Patient is moving to [place] to be closer to his daughters.  Wish him the best. RTC in 6 months if he is still in the area.   Td, RSV, Shingrix and COVID-vaccine recommended Zostavax 2015  PNM 13: 2019 PNM 23: 2022 Covid VAX x3 Had a flu shot CCS: Colonoscopy 07-2018, next 7 years per GI letter Prostate cancer: Per urology Labs: FLP A[facility] POA: Has a document.  ============   Sarcoidosis? The patient developed a rash at the right buttock, went to his  dermatologist. Bx showed>>. Sections show foreign body type of granulomas in the dermis with focal hyalinization and a peripheral rim of lymphocyte surrounding most of the granulomas.  Some of the granulomas are naked.  The pattern could possibly be seen in sarcoidosis. Also could be a infection process, PAS and Fite stains were performed and negative for atypical mycobacteria and fungi. Polarization for foreign material is negative. The patient request further evaluation.  Will start with the following: CMP, CBC, HIV, ACE level, vitamin D, ionized calcium.  Also a chest x-ray. Last EKG 12-2021 normal. Will follow-up any abnormal labs.  If further assessment is needed, consider referral to pulmonary. Also requested B12 level.  Will do.

## 2022-07-12 ENCOUNTER — Encounter: Payer: Self-pay | Admitting: Internal Medicine

## 2022-07-12 DIAGNOSIS — H26493 Other secondary cataract, bilateral: Secondary | ICD-10-CM | POA: Diagnosis not present

## 2022-07-12 NOTE — Assessment & Plan Note (Signed)
Here for CPX Hyperglycemia: Check A1c High cholesterol: Continue same meds, check FLP tomorrow, fasting. CAD: Asymptomatic. Likely idiopathic Parkinson disease: Last visit with neurology 05-2022, started carbidopa levodopa.   Seizures felt to be stable. Sarcoidosis? See last visit, question of sarcoidosis, workup did not support the dx of systemic sarcoidosis .  Chest x-ray negative. Prostate cancer: Had a biopsy 05-2021, status post brachytherapy, last visit with urology 03/21/2022 Social: Patient is moving to [place] to be closer to his daughters.  Wish him the best. RTC in 6 months if he is still in the area.

## 2022-07-12 NOTE — Assessment & Plan Note (Signed)
Td, RSV, Shingrix and COVID-vaccine recommended Zostavax 2015 PNM 13: 2019 PNM 23: 2022 Covid VAX x3 Had a flu shot CCS: Colonoscopy 07-2018, next 7 years per GI letter Prostate cancer: Per urology Labs: FLP [facility] POA: Has a document.

## 2022-07-13 ENCOUNTER — Other Ambulatory Visit (INDEPENDENT_AMBULATORY_CARE_PROVIDER_SITE_OTHER): Payer: PPO

## 2022-07-13 DIAGNOSIS — E785 Hyperlipidemia, unspecified: Secondary | ICD-10-CM

## 2022-07-13 DIAGNOSIS — R739 Hyperglycemia, unspecified: Secondary | ICD-10-CM | POA: Diagnosis not present

## 2022-07-13 LAB — LIPID PANEL
Cholesterol: 132 mg/dL (ref 0–200)
HDL: 57.4 mg/dL (ref >39.00)
LDL Cholesterol: 60 mg/dL (ref 0–99)
NonHDL: 74.95
Total CHOL/HDL Ratio: 2
Triglycerides: 74 mg/dL (ref 0.0–149.0)
VLDL: 14.8 mg/dL (ref 0.0–40.0)

## 2022-07-13 LAB — HEMOGLOBIN A1C: Hgb A1c MFr Bld: 5.8 % (ref 4.6–6.5)

## 2022-07-14 DIAGNOSIS — R311 Benign essential microscopic hematuria: Secondary | ICD-10-CM | POA: Diagnosis not present

## 2022-07-14 DIAGNOSIS — C61 Malignant neoplasm of prostate: Secondary | ICD-10-CM | POA: Diagnosis not present

## 2022-07-17 ENCOUNTER — Encounter: Payer: Self-pay | Admitting: Internal Medicine

## 2022-08-02 DIAGNOSIS — G20A1 Parkinson's disease without dyskinesia, without mention of fluctuations: Secondary | ICD-10-CM | POA: Diagnosis not present

## 2022-08-09 ENCOUNTER — Encounter: Payer: Self-pay | Admitting: Family Medicine

## 2022-08-09 ENCOUNTER — Ambulatory Visit: Payer: PPO | Admitting: Family Medicine

## 2022-08-09 VITALS — BP 128/60 | Ht 68.0 in | Wt 150.0 lb

## 2022-08-09 DIAGNOSIS — M79671 Pain in right foot: Secondary | ICD-10-CM

## 2022-08-09 DIAGNOSIS — M7741 Metatarsalgia, right foot: Secondary | ICD-10-CM

## 2022-08-09 NOTE — Patient Instructions (Addendum)
Your pain is due to metatarsalgia. These metatarsal pads are the most important part of treatment. You can buy corn pads (they look like donuts) if that bony prominence is bothering you enough. Follow up with [place] in 1 month.

## 2022-08-09 NOTE — Progress Notes (Signed)
PCP: Colon Branch, MD  Subjective:   HPI: Patient is a 72 y.o. male here for right foot pain.  Patient reports he's had about 8-10 months of right plantar foot pain. Initially pain was more medial under 2nd toe but more recently pain is lateral under 4th and 5th digits in ball of foot. He was provided with metatarsal pad by a running store which helped a lot initially. Saw a podiatrist and had two injections which did not help. He tried applying some padding to his insoles himself but this did not provide relief. No swelling, bruising.  Past Medical History:  Diagnosis Date   Allergy    mild seasonal   Annual physical exam 12/18/2013   BCC (basal cell carcinoma of skin)    Bilateral inguinal hernia s/p lap repair with mesh 09/26/2017 09/26/2017   CAD (coronary artery disease) 11/04/2015   Constipation    started Linzess monday 08-12-2018    Corneal intraepithelial neoplasia 08/03/2021   Formatting of this note might be different from the original. Added automatically from request for surgery [id number]   Glaucoma both eyes    History of seizure    reports no seizure since 2015    HIstory of Sleep apnea    reports no longer has since losing weight   Hx of exercise stress test    reports  had done to eval since coronary artery stent was placed ; reports test was "normal"    Hyperlipidemia    Left leg numbness 11/22/2016   Malignant neoplasm of prostate ([place]) 06/30/2021   Mixed hyperlipidemia 11/24/2009   Overview:  IMPRESSION: Check labs today.  Continue Pravachol 40 mg daily.   Mixed sleep apnea 03/30/2011   Parkinson's disease 02/08/2021   Partial epilepsy with impairment of consciousness ([place]) 12/18/2013   Polypharmacy 12/18/2013   Prostate cancer ([place])    PSVT (paroxysmal supraventricular tachycardia)    reports no knowledge of this    Right hand Tremors of nervous system    Seizure disorder ([place]) 05/10/2011   Seizures ([place])    last 2015    Current Outpatient Medications on File  Prior to Visit  Medication Sig Dispense Refill   aspirin EC 81 MG tablet Take 81 mg by mouth daily.      atorvastatin (LIPITOR) 80 MG tablet Take 1 tablet (80 mg total) by mouth at bedtime. 90 tablet 1   carbidopa-levodopa (SINEMET IR) 25-100 MG tablet Take 0.5-1 tablets by mouth 3 (three) times daily before meals. 270 tablet 1   ezetimibe (ZETIA) 10 MG tablet Take 1 tablet (10 mg total) by mouth daily. 90 tablet 3   lamoTRIgine (LAMICTAL) 150 MG tablet Take 1 tablet (150 mg total) by mouth 2 (two) times daily. 180 tablet 4   latanoprost (XALATAN) 0.005 % ophthalmic solution Place 1 drop into both eyes at bedtime. (Patient not taking: Reported on 07/11/2022)     linaclotide (LINZESS) 290 MCG CAPS capsule Take 1 capsule (290 mcg total) by mouth daily before breakfast. 90 capsule 4   OVER THE COUNTER MEDICATION Place 1 drop into both eyes 3 (three) times daily. Blink eye drop     polyethylene glycol (MIRALAX / GLYCOLAX) 17 g packet Take 17 g by mouth daily.     tamsulosin (FLOMAX) 0.4 MG CAPS capsule Take 0.4 mg by mouth daily after supper.     timolol (TIMOPTIC) 0.5 % ophthalmic solution Place 1 drop into both eyes daily. (Patient not taking: Reported on 07/11/2022)  Vitamin D, Ergocalciferol, (DRISDOL) 1.25 MG (50000 UNIT) CAPS capsule Take 1 capsule (50,000 Units total) by mouth every 7 (seven) days. 12 capsule 0   No current facility-administered medications on file prior to visit.    Past Surgical History:  Procedure Laterality Date   CARDIAC CATHETERIZATION  2015   CATARACT EXTRACTION, BILATERAL Bilateral    CORONARY ANGIOPLASTY  2015   CORONARY STENT PLACEMENT  2015   x2. Mid LAD   INGUINAL HERNIA REPAIR Bilateral 09/26/2017   Procedure: LAPAROSCOPIC BILATERAL INGUINAL HERNIA REPAIR;  Surgeon: Michael Boston, MD;  [facility];  Service: General;  Laterality: Bilateral;   INSERTION OF MESH Bilateral 09/26/2017   Procedure: INSERTION OF MESH;  Surgeon: Michael Boston, MD;   [facility];  Service: General;  Laterality: Bilateral;   RADIOACTIVE SEED IMPLANT N/A 11/24/2021   Procedure: RADIOACTIVE SEED IMPLANT/BRACHYTHERAPY IMPLANT;  Surgeon: Franchot Gallo, MD;  Location: [facility];  Service: Urology;  Laterality: N/A;   right eye cornea surgery  11/03/2021   @ [facility]   SPACE OAR INSTILLATION N/A 11/24/2021   Procedure: SPACE OAR INSTILLATION;  Surgeon: Franchot Gallo, MD;  Location: [facility];  Service: Urology;  Laterality: N/A;    Allergies  Allergen Reactions   Penicillins Swelling    Arm swelling Has patient had a PCN reaction causing immediate rash, facial/tongue/throat swelling, SOB or lightheadedness with hypotension: No Has patient had a PCN reaction causing severe rash involving mucus membranes or skin necrosis: No Has patient had a PCN reaction that required hospitalization: No Has patient had a PCN reaction occurring within the last 10 years: No If all of the above answers are "NO", then may proceed with Cephalosporin use.     BP 128/60   Ht '5\' 8"'$  (1.727 m)   Wt 150 lb (68 kg)   BMI 22.81 kg/m       No data to display              No data to display              Objective:  Physical Exam:  Gen: NAD, comfortable in exam room  Right foot: Transverse arch collapse.  Small callus laterally over 5th MT head.  Bony spurring medial aspect of TMT joint.  No other gross deformity, swelling, ecchymoses FROM ankle, digits without pain. TTP mildly over 4th, 5th MT heads on plantar surface and over callus laterally. Thompsons test negative. NV intact distally.   Assessment & Plan:  1. Right foot pain - 2/2 metatarsalgia.  Assessing his inserts the metatarsal pad that was placed previously has slid a little medially.  And pad he placed is too anterior to provide benefit.  New metatarsal pad placed in correct spot - consider large pad if not improving.  Icing if needed.  F/u in 1 month.

## 2022-08-15 DIAGNOSIS — Z961 Presence of intraocular lens: Secondary | ICD-10-CM | POA: Insufficient documentation

## 2022-08-15 HISTORY — DX: Presence of intraocular lens: Z96.1

## 2022-08-16 DIAGNOSIS — H26492 Other secondary cataract, left eye: Secondary | ICD-10-CM | POA: Diagnosis not present

## 2022-09-06 ENCOUNTER — Ambulatory Visit (INDEPENDENT_AMBULATORY_CARE_PROVIDER_SITE_OTHER): Payer: PPO | Admitting: Family Medicine

## 2022-09-06 ENCOUNTER — Other Ambulatory Visit: Payer: Self-pay

## 2022-09-06 VITALS — BP 118/70 | Ht 68.0 in | Wt 150.0 lb

## 2022-09-06 DIAGNOSIS — M79671 Pain in right foot: Secondary | ICD-10-CM

## 2022-09-06 DIAGNOSIS — M7741 Metatarsalgia, right foot: Secondary | ICD-10-CM | POA: Diagnosis not present

## 2022-09-07 ENCOUNTER — Other Ambulatory Visit: Payer: Self-pay | Admitting: Internal Medicine

## 2022-09-07 ENCOUNTER — Encounter: Payer: Self-pay | Admitting: Family Medicine

## 2022-09-07 NOTE — Progress Notes (Signed)
PCP: Wanda Plump, MD  Subjective:   HPI: Patient is a 72 y.o. male here for right foot pain.  3/13: Patient reports he's had about 8-10 months of right plantar foot pain. Initially pain was more medial under 2nd toe but more recently pain is lateral under 4th and 5th digits in ball of foot. He was provided with metatarsal pad by a running store which helped a lot initially. Saw a podiatrist and had two injections which did not help. He tried applying some padding to his insoles himself but this did not provide relief. No swelling, bruising.  4/10: Patient reports he's doing better. Able to walk 4 miles now without pain. No other complaints.  Past Medical History:  Diagnosis Date   Age-related nuclear cataract of both eyes 01/11/2022   Allergy    mild seasonal   Annual physical exam 12/18/2013   BCC (basal cell carcinoma of skin)    Bilateral inguinal hernia s/p lap repair with mesh 09/26/2017 09/26/2017   CAD (coronary artery disease) 11/04/2015   Constipation    started Linzess monday 08-12-2018    Corneal intraepithelial neoplasia 08/03/2021   Formatting of this note might be different from the original. Added automatically from request for surgery [id number]   Dry eye syndrome of both lacrimal glands 04/07/2022   Glaucoma both eyes    History of seizure    reports no seizure since 2015    HIstory of Sleep apnea    reports no longer has since losing weight   Hx of exercise stress test    reports  had done to eval since coronary artery stent was placed ; reports test was "normal"    Hyperlipidemia    Left leg numbness 11/22/2016   Malignant neoplasm of prostate 06/30/2021   Mixed hyperlipidemia 11/24/2009   Overview:  IMPRESSION: Check labs today.  Continue Pravachol 40 mg daily.   Mixed sleep apnea 03/30/2011   Neuroma 11/04/2015   Papilloma of eyelid, right 07/10/2022   Parkinson's disease 02/08/2021   Partial epilepsy with impairment of consciousness 12/18/2013    Polypharmacy 12/18/2013   Primary open angle glaucoma (POAG) of both eyes, mild stage 04/06/2022   Prostate cancer    Pseudophakia of both eyes 08/15/2022   PSVT (paroxysmal supraventricular tachycardia)    reports no knowledge of this    Right hand Tremors of nervous system    Seizure disorder 05/10/2011   Seizures    last 2015    Current Outpatient Medications on File Prior to Visit  Medication Sig Dispense Refill   aspirin EC 81 MG tablet Take 81 mg by mouth daily.      atorvastatin (LIPITOR) 80 MG tablet Take 1 tablet (80 mg total) by mouth at bedtime. 90 tablet 1   carbidopa-levodopa (SINEMET IR) 25-100 MG tablet Take 0.5-1 tablets by mouth 3 (three) times daily before meals. 270 tablet 1   ezetimibe (ZETIA) 10 MG tablet Take 1 tablet (10 mg total) by mouth daily. 90 tablet 3   lamoTRIgine (LAMICTAL) 150 MG tablet Take 1 tablet (150 mg total) by mouth 2 (two) times daily. 180 tablet 4   linaclotide (LINZESS) 290 MCG CAPS capsule Take 1 capsule (290 mcg total) by mouth daily before breakfast. 90 capsule 4   OVER THE COUNTER MEDICATION Place 1 drop into both eyes 3 (three) times daily. Blink eye drop     polyethylene glycol (MIRALAX / GLYCOLAX) 17 g packet Take 17 g by mouth daily.  tamsulosin (FLOMAX) 0.4 MG CAPS capsule Take 0.4 mg by mouth daily after supper.     Vitamin D, Ergocalciferol, (DRISDOL) 1.25 MG (50000 UNIT) CAPS capsule Take 1 capsule (50,000 Units total) by mouth every 7 (seven) days. 12 capsule 0   No current facility-administered medications on file prior to visit.    Past Surgical History:  Procedure Laterality Date   CARDIAC CATHETERIZATION  2015   CATARACT EXTRACTION, BILATERAL Bilateral    CORONARY ANGIOPLASTY  2015   CORONARY STENT PLACEMENT  2015   x2. Mid LAD   INGUINAL HERNIA REPAIR Bilateral 09/26/2017   Procedure: LAPAROSCOPIC BILATERAL INGUINAL HERNIA REPAIR;  Surgeon: Karie Soda, MD;  [facility];  Service: General;  Laterality:  Bilateral;   INSERTION OF MESH Bilateral 09/26/2017   Procedure: INSERTION OF MESH;  Surgeon: Karie Soda, MD;  [facility];  Service: General;  Laterality: Bilateral;   RADIOACTIVE SEED IMPLANT N/A 11/24/2021   Procedure: RADIOACTIVE SEED IMPLANT/BRACHYTHERAPY IMPLANT;  Surgeon: Marcine Matar, MD;  Location: [facility];  Service: Urology;  Laterality: N/A;   right eye cornea surgery  11/03/2021   @ [facility]   SPACE OAR INSTILLATION N/A 11/24/2021   Procedure: SPACE OAR INSTILLATION;  Surgeon: Marcine Matar, MD;  Location[facility];  Service: Urology;  Laterality: N/A;    Allergies  Allergen Reactions   Penicillins Swelling    Arm swelling Has patient had a PCN reaction causing immediate rash, facial/tongue/throat swelling, SOB or lightheadedness with hypotension: No Has patient had a PCN reaction causing severe rash involving mucus membranes or skin necrosis: No Has patient had a PCN reaction that required hospitalization: No Has patient had a PCN reaction occurring within the last 10 years: No If all of the above answers are "NO", then may proceed with Cephalosporin use.     BP 118/70   Ht 5\' 8"  (1.727 m)   Wt 150 lb (68 kg)   BMI 22.81 kg/m       No data to display              No data to display              Objective:  Physical Exam:  Gen: NAD, comfortable in exam room  Right foot: Transverse arch collapse.  Small callus laterally over 5th MT head.  No other deformity, swelling, ecchymoses. FROM digits, ankle without pain. No TTP currently. NVI distally.   Assessment & Plan:  1. Right foot pain - 2/2 metatarsalgia.  Doing well with metatarsal pads.  Pad placed in another set of shoes as well.  Discussed need to replace these every 6 months and how to order/do so.  F/u prn.

## 2022-09-12 DIAGNOSIS — D2261 Melanocytic nevi of right upper limb, including shoulder: Secondary | ICD-10-CM | POA: Diagnosis not present

## 2022-09-12 DIAGNOSIS — L814 Other melanin hyperpigmentation: Secondary | ICD-10-CM | POA: Diagnosis not present

## 2022-09-12 DIAGNOSIS — D692 Other nonthrombocytopenic purpura: Secondary | ICD-10-CM | POA: Diagnosis not present

## 2022-09-12 DIAGNOSIS — D1801 Hemangioma of skin and subcutaneous tissue: Secondary | ICD-10-CM | POA: Diagnosis not present

## 2022-09-12 DIAGNOSIS — D225 Melanocytic nevi of trunk: Secondary | ICD-10-CM | POA: Diagnosis not present

## 2022-09-12 DIAGNOSIS — D2262 Melanocytic nevi of left upper limb, including shoulder: Secondary | ICD-10-CM | POA: Diagnosis not present

## 2022-09-12 DIAGNOSIS — L570 Actinic keratosis: Secondary | ICD-10-CM | POA: Diagnosis not present

## 2022-09-12 DIAGNOSIS — D863 Sarcoidosis of skin: Secondary | ICD-10-CM | POA: Diagnosis not present

## 2022-09-12 DIAGNOSIS — Z85828 Personal history of other malignant neoplasm of skin: Secondary | ICD-10-CM | POA: Diagnosis not present

## 2022-09-13 DIAGNOSIS — H401131 Primary open-angle glaucoma, bilateral, mild stage: Secondary | ICD-10-CM | POA: Diagnosis not present

## 2022-09-13 DIAGNOSIS — D2310 Other benign neoplasm of skin of unspecified eyelid, including canthus: Secondary | ICD-10-CM | POA: Diagnosis not present

## 2022-09-13 DIAGNOSIS — D4989 Neoplasm of unspecified behavior of other specified sites: Secondary | ICD-10-CM | POA: Diagnosis not present

## 2022-09-13 DIAGNOSIS — Z961 Presence of intraocular lens: Secondary | ICD-10-CM | POA: Diagnosis not present

## 2022-09-13 DIAGNOSIS — Z9889 Other specified postprocedural states: Secondary | ICD-10-CM | POA: Diagnosis not present

## 2022-09-13 DIAGNOSIS — H04123 Dry eye syndrome of bilateral lacrimal glands: Secondary | ICD-10-CM | POA: Diagnosis not present

## 2022-09-14 ENCOUNTER — Ambulatory Visit: Payer: PPO | Attending: Cardiology | Admitting: Cardiology

## 2022-09-14 ENCOUNTER — Encounter: Payer: Self-pay | Admitting: Cardiology

## 2022-09-14 VITALS — BP 94/52 | HR 65 | Ht 67.0 in | Wt 148.1 lb

## 2022-09-14 DIAGNOSIS — G4730 Sleep apnea, unspecified: Secondary | ICD-10-CM | POA: Diagnosis not present

## 2022-09-14 DIAGNOSIS — G20A1 Parkinson's disease without dyskinesia, without mention of fluctuations: Secondary | ICD-10-CM | POA: Diagnosis not present

## 2022-09-14 DIAGNOSIS — I25118 Atherosclerotic heart disease of native coronary artery with other forms of angina pectoris: Secondary | ICD-10-CM

## 2022-09-14 DIAGNOSIS — E782 Mixed hyperlipidemia: Secondary | ICD-10-CM

## 2022-09-14 DIAGNOSIS — I471 Supraventricular tachycardia, unspecified: Secondary | ICD-10-CM

## 2022-09-14 MED ORDER — ATORVASTATIN CALCIUM 80 MG PO TABS: 80.0000 mg | ORAL_TABLET | Freq: Every day | ORAL | 3 refills | Status: DC

## 2022-09-14 NOTE — Patient Instructions (Signed)
Medication Instructions:  Your physician recommends that you continue on your current medications as directed. Please refer to the Current Medication list given to you today.  *If you need a refill on your cardiac medications before your next appointment, please call your pharmacy*   Lab Work: None ordered If you have labs (blood work) drawn today and your tests are completely normal, you will receive your results only by: MyChart Message (if you have MyChart) OR A paper copy in the mail If you have any lab test that is abnormal or we need to change your treatment, we will call you to review the results.   Testing/Procedures: Your physician has requested that you have an echocardiogram. Echocardiography is a painless test that uses sound waves to create images of your heart. It provides your doctor with information about the size and shape of your heart and how well your heart's chambers and valves are working. This procedure takes approximately one hour. There are no restrictions for this procedure. Please do NOT wear cologne, perfume, aftershave, or lotions (deodorant is allowed). Please arrive 15 minutes prior to your appointment time.    Follow-Up: At [facility], you and your health needs are our priority.  As part of our continuing mission to provide you with exceptional heart care, we have created designated Provider Care Teams.  These Care Teams include your primary Cardiologist (physician) and Advanced Practice Providers (APPs -  Physician Assistants and Nurse Practitioners) who all work together to provide you with the care you need, when you need it.  We recommend signing up for the patient portal called "MyChart".  Sign up information is provided on this After Visit Summary.  MyChart is used to connect with patients for Virtual Visits (Telemedicine).  Patients are able to view lab/test results, encounter notes, upcoming appointments, etc.  Non-urgent messages can be sent  to your provider as well.   To learn more about what you can do with MyChart, go to [email or web address].    Your next appointment:   9 month(s)  The format for your next appointment:   In Person  Provider:   Rajan Revankar, MD    Other Instructions none  Important Information About Sugar       

## 2022-09-14 NOTE — Progress Notes (Signed)
Cardiology Office Note:    Date:  09/14/2022   ID:  Hunter Vega, Hunter Vega 28-Dec-1950, MRN [id number]  PCP:  Wanda Plump, MD  Cardiologist:  Garwin Brothers, MD   Referring MD: Wanda Plump, MD    ASSESSMENT:    1. Coronary artery disease of native artery of native heart with stable angina pectoris   2. PSVT (paroxysmal supraventricular tachycardia)   3. Sleep apnea, unspecified type   4. Parkinson's disease without dyskinesia or fluctuating manifestations   5. Mixed hyperlipidemia    PLAN:    In order of problems listed above:  Coronary artery disease: Secondary prevention stressed with the patient.  Importance of compliance with diet medication stressed and vocalized understanding. He was advised to walk at least half an hour a day 5 days a week and he promises to do so. Mixed dyslipidemia: On lipid-lowering medications followed by primary care.  Diet was emphasized. Sarcoidosis: Will do echocardiogram to assess this.  To see if there is involvement cardiac.  Also this will help me assess murmur heard on auscultation. Patient will be seen in follow-up appointment in 6 months or earlier if the patient has any concerns.    Medication Adjustments/Labs and Tests Ordered: Current medicines are reviewed at length with the patient today.  Concerns regarding medicines are outlined above.  Orders Placed This Encounter  Procedures   EKG 12-Lead   ECHOCARDIOGRAM COMPLETE   Meds ordered this encounter  Medications   atorvastatin (LIPITOR) 80 MG tablet    Sig: Take 1 tablet (80 mg total) by mouth at bedtime.    Dispense:  90 tablet    Refill:  3     No chief complaint on file.    History of Present Illness:    Hunter Vega is a 72 y.o. male.  Patient has past medical history of coronary artery disease and mixed dyslipidemia.  He mentions to me that recently has been diagnosed with sarcoidosis.  No chest pain orthopnea or PND.  He walks about a mile daily without any  problems.  He has seen Dr. Dulce Sellar in the past.  At the time of my evaluation, the patient is alert awake oriented and in no distress.  Past Medical History:  Diagnosis Date   Age-related nuclear cataract of both eyes 01/11/2022   Allergy    mild seasonal   Annual physical exam 12/18/2013   BCC (basal cell carcinoma of skin)    Bilateral inguinal hernia s/p lap repair with mesh 09/26/2017 09/26/2017   CAD (coronary artery disease) 11/04/2015   Constipation    started Linzess monday 08-12-2018    Corneal intraepithelial neoplasia 08/03/2021   Formatting of this note might be different from the original. Added automatically from request for surgery [id number]   Dry eye syndrome of both lacrimal glands 04/07/2022   Glaucoma both eyes    History of seizure    reports no seizure since 2015    HIstory of Sleep apnea    reports no longer has since losing weight   Hx of exercise stress test    reports  had done to eval since coronary artery stent was placed ; reports test was "normal"    Hyperlipidemia    Left leg numbness 11/22/2016   Malignant neoplasm of prostate 06/30/2021   Mixed hyperlipidemia 11/24/2009   Overview:  IMPRESSION: Check labs today.  Continue Pravachol 40 mg daily.   Mixed sleep apnea 03/30/2011   Neuroma 11/04/2015  Papilloma of eyelid, right 07/10/2022   Parkinson's disease 02/08/2021   Partial epilepsy with impairment of consciousness 12/18/2013   Polypharmacy 12/18/2013   Primary open angle glaucoma (POAG) of both eyes, mild stage 04/06/2022   Prostate cancer    Pseudophakia of both eyes 08/15/2022   PSVT (paroxysmal supraventricular tachycardia)    reports no knowledge of this    Right hand Tremors of nervous system    Seizure disorder 05/10/2011   Seizures    last 2015    Past Surgical History:  Procedure Laterality Date   CARDIAC CATHETERIZATION  2015   CATARACT EXTRACTION, BILATERAL Bilateral    CORONARY ANGIOPLASTY  2015   CORONARY STENT PLACEMENT   2015   x2. Mid LAD   INGUINAL HERNIA REPAIR Bilateral 09/26/2017   Procedure: LAPAROSCOPIC BILATERAL INGUINAL HERNIA REPAIR;  Surgeon: Karie Soda, MD;  Location: [facility];  Service: General;  Laterality: Bilateral;   INSERTION OF MESH Bilateral 09/26/2017   Procedure: INSERTION OF MESH;  Surgeon: Karie Soda, MD;  Location: [facility];  Service: General;  Laterality: Bilateral;   RADIOACTIVE SEED IMPLANT N/A 11/24/2021   Procedure: RADIOACTIVE SEED IMPLANT/BRACHYTHERAPY IMPLANT;  Surgeon: Marcine Matar, MD;  [facility];  Service: Urology;  Laterality: N/A;   right eye cornea surgery  11/03/2021   @ [facility]   SPACE OAR INSTILLATION N/A 11/24/2021   Procedure: SPACE OAR INSTILLATION;  Surgeon: Marcine Matar, MD;  Location: [facility];  Service: Urology;  Laterality: N/A;    Current Medications: Current Meds  Medication Sig   aspirin EC 81 MG tablet Take 81 mg by mouth daily.    carbidopa-levodopa (SINEMET IR) 25-100 MG tablet Take 0.5-1 tablets by mouth 3 (three) times daily before meals.   ezetimibe (ZETIA) 10 MG tablet Take 1 tablet (10 mg total) by mouth daily.   lamoTRIgine (LAMICTAL) 150 MG tablet Take 1 tablet (150 mg total) by mouth 2 (two) times daily.   linaclotide (LINZESS) 290 MCG CAPS capsule Take 1 capsule (290 mcg total) by mouth daily before breakfast.   Polyethyl Glycol-Propyl Glycol (SYSTANE OP) Apply 10 mLs to eye 3 (three) times daily.   polyethylene glycol (MIRALAX / GLYCOLAX) 17 g packet Take 17 g by mouth daily.   tamsulosin (FLOMAX) 0.4 MG CAPS capsule Take 0.4 mg by mouth daily after supper.   [DISCONTINUED] atorvastatin (LIPITOR) 80 MG tablet Take 1 tablet (80 mg total) by mouth at bedtime.     Allergies:   Penicillins   Social History   Socioeconomic History   Marital status: Married    Spouse name: [name]   Number of children: 3   Years of education: Not on file   Highest education level: Not on file   Occupational History   Occupation: Emergency planning/management officer [employer]    Comment: retired  Tobacco Use   Smoking status: Never    Passive exposure: Never   Smokeless tobacco: Never  Vaping Use   Vaping Use: Never used  Substance and Sexual Activity   Alcohol use: No   Drug use: No   Sexual activity: Yes  Other Topics Concern   Not on file  Social History Narrative   Lives with wife   Caffeine use: 1 cup corfee daily   Left handed    Social Determinants of Health   Financial Resource Strain: Low Risk  (12/05/2021)   Overall Financial Resource Strain (CARDIA)    Difficulty of Paying Living Expenses: Not hard at all  Food Insecurity:  No Food Insecurity (12/05/2021)   Hunger Vital Sign    Worried About Running Out of Food in the Last Year: Never true    Ran Out of Food in the Last Year: Never true  Transportation Needs: No Transportation Needs (12/05/2021)   PRAPARE - Administrator, Civil Service (Medical): No    Lack of Transportation (Non-Medical): No  Physical Activity: Sufficiently Active (12/05/2021)   Exercise Vital Sign    Days of Exercise per Week: 5 days    Minutes of Exercise per Session: 30 min  Stress: No Stress Concern Present (12/05/2021)   [employer] of Occupational Health - Occupational Stress Questionnaire    Feeling of Stress : Not at all  Social Connections: Moderately Integrated (11/23/2020)   Social Connection and Isolation Panel [NHANES]    Frequency of Communication with Friends and Family: More than three times a week    Frequency of Social Gatherings with Friends and Family: More than three times a week    Attends Religious Services: Never    Database administrator or Organizations: Yes    Attends Banker Meetings: 1 to 4 times per year    Marital Status: Married     Family History: The patient's family history includes Diabetes in his paternal uncle; Heart attack in his father; Liver disease in his mother. There is no history  of Colon cancer, Esophageal cancer, Prostate cancer, Colon polyps, Rectal cancer, or Stomach cancer.  ROS:   Please see the history of present illness.    All other systems reviewed and are negative.  EKGs/Labs/Other Studies Reviewed:    The following studies were reviewed today: EKG reveals sinus poor anterior forces and nonspecific ST changes   Recent Labs: 05/26/2022: ALT 16; BUN 24; Creat 1.15; Hemoglobin 12.7; Platelets 179; Potassium 4.8; Sodium 141  Recent Lipid Panel    Component Value Date/Time   CHOL 132 07/13/2022 0924   CHOL 151 01/30/2019 0919   TRIG 74.0 07/13/2022 0924   HDL 57.40 07/13/2022 0924   HDL 67 01/30/2019 0919   CHOLHDL 2 07/13/2022 0924   VLDL 14.8 07/13/2022 0924   LDLCALC 60 07/13/2022 0924   LDLCALC 66 02/12/2020 0958    Physical Exam:    VS:  BP (!) 94/52   Pulse 65   Ht  (1.702 m)   Wt 148 lb 1.3 oz (67.2 kg)   SpO2 96%   BMI 23.19 kg/m     Wt Readings from Last 3 Encounters:  09/14/22 148 lb 1.3 oz (67.2 kg)  09/06/22 150 lb (68 kg)  08/09/22 150 lb (68 kg)     GEN: Patient is in no acute distress HEENT: Normal NECK: No JVD; No carotid bruits LYMPHATICS: No lymphadenopathy CARDIAC: Hear sounds regular, 2/6 systolic murmur at the apex. RESPIRATORY:  Clear to auscultation without rales, wheezing or rhonchi  ABDOMEN: Soft, non-tender, non-distended MUSCULOSKELETAL:  No edema; No deformity  SKIN: Warm and dry NEUROLOGIC:  Alert and oriented x 3 PSYCHIATRIC:  Normal affect   Signed, Garwin Brothers, MD  09/14/2022 2:51 PM    [facility]

## 2022-09-27 ENCOUNTER — Encounter: Payer: Self-pay | Admitting: Diagnostic Neuroimaging

## 2022-09-27 ENCOUNTER — Encounter: Payer: Self-pay | Admitting: Internal Medicine

## 2022-09-27 MED ORDER — EZETIMIBE 10 MG PO TABS: 10.0000 mg | ORAL_TABLET | Freq: Every day | ORAL | 3 refills | Status: DC

## 2022-09-28 ENCOUNTER — Other Ambulatory Visit: Payer: Self-pay

## 2022-09-28 MED ORDER — LINACLOTIDE 290 MCG PO CAPS: 290.0000 ug | ORAL_CAPSULE | Freq: Every day | ORAL | 4 refills | Status: AC

## 2022-09-28 MED ORDER — CARBIDOPA-LEVODOPA 25-100 MG PO TABS: 0.5000 | ORAL_TABLET | Freq: Three times a day (TID) | ORAL | 4 refills | Status: DC

## 2022-10-03 DIAGNOSIS — C61 Malignant neoplasm of prostate: Secondary | ICD-10-CM | POA: Diagnosis not present

## 2022-10-05 ENCOUNTER — Ambulatory Visit (HOSPITAL_BASED_OUTPATIENT_CLINIC_OR_DEPARTMENT_OTHER)
Admission: RE | Admit: 2022-10-05 | Discharge: 2022-10-05 | Disposition: A | Discharge status: Home / Self Care | Payer: PPO | Source: Ambulatory Visit | Attending: Cardiology | Admitting: Cardiology

## 2022-10-05 DIAGNOSIS — I471 Supraventricular tachycardia, unspecified: Secondary | ICD-10-CM | POA: Insufficient documentation

## 2022-10-05 DIAGNOSIS — I25118 Atherosclerotic heart disease of native coronary artery with other forms of angina pectoris: Secondary | ICD-10-CM | POA: Diagnosis not present

## 2022-10-05 DIAGNOSIS — R600 Localized edema: Secondary | ICD-10-CM

## 2022-10-05 DIAGNOSIS — R011 Cardiac murmur, unspecified: Secondary | ICD-10-CM

## 2022-10-05 LAB — ECHOCARDIOGRAM COMPLETE
AR max vel: 1.83 cm2
AV Area VTI: 2.04 cm2
AV Area mean vel: 1.73 cm2
AV Mean grad: 7 mmHg
AV Peak grad: 12.1 mmHg
Ao pk vel: 1.74 m/s
Area-P 1/2: 3.23 cm2
S' Lateral: 1.9 cm

## 2022-11-07 ENCOUNTER — Ambulatory Visit: Payer: PPO | Admitting: Diagnostic Neuroimaging

## 2022-11-29 ENCOUNTER — Encounter: Payer: Self-pay | Admitting: Internal Medicine

## 2022-11-29 ENCOUNTER — Encounter: Payer: Self-pay | Admitting: Diagnostic Neuroimaging

## 2022-11-29 DIAGNOSIS — I471 Supraventricular tachycardia, unspecified: Secondary | ICD-10-CM

## 2022-11-29 DIAGNOSIS — I25118 Atherosclerotic heart disease of native coronary artery with other forms of angina pectoris: Secondary | ICD-10-CM

## 2022-12-01 MED ORDER — EZETIMIBE 10 MG PO TABS: 10.0000 mg | ORAL_TABLET | Freq: Every day | ORAL | 1 refills | Status: AC

## 2022-12-01 MED ORDER — ATORVASTATIN CALCIUM 80 MG PO TABS: 80.0000 mg | ORAL_TABLET | Freq: Every day | ORAL | 1 refills | Status: DC

## 2022-12-04 ENCOUNTER — Other Ambulatory Visit: Payer: Self-pay | Admitting: Anesthesiology

## 2022-12-04 MED ORDER — CARBIDOPA-LEVODOPA 25-100 MG PO TABS: 0.5000 | ORAL_TABLET | Freq: Three times a day (TID) | ORAL | 4 refills | Status: DC

## 2022-12-07 ENCOUNTER — Other Ambulatory Visit: Payer: Self-pay | Admitting: Anesthesiology

## 2022-12-07 ENCOUNTER — Telehealth: Payer: Self-pay | Admitting: Diagnostic Neuroimaging

## 2022-12-07 MED ORDER — CARBIDOPA-LEVODOPA 25-100 MG PO TABS: 0.5000 | ORAL_TABLET | Freq: Three times a day (TID) | ORAL | 4 refills | Status: AC

## 2022-12-07 NOTE — Telephone Encounter (Signed)
Pharmacist Janyth Pupa from [facility] [phone] - reports that by accident re: pt's carbidopa-levodopa (SINEMET IR) 25-100 MG tablet Extended release was filled by someone in the pharmacy instead of the immediate release.  Janyth Pupa states he was able to reach pt and have him bring in the wrong medication.  He states based on [facility]'s system they can not re do the prescription again.  Janyth Pupa is asking if the correct Rx can be sent to them again.  His contact info is : Psychologist, forensic [address] [place] - [address] Phone: [phone]  Fax: [phone]

## 2022-12-07 NOTE — Telephone Encounter (Signed)
I returned a call to [employer] pharmacist at  [facility] in [place], regarding the mistake they had made giving the pt the wrong prescription. A new prescription for Carbidopa-Levodopa 25-100 mg was sent. Pt will be able to get Rx filled today  and won't be charged another co pay for his medication.

## 2022-12-08 ENCOUNTER — Ambulatory Visit: Payer: PPO

## 2022-12-27 ENCOUNTER — Encounter (INDEPENDENT_AMBULATORY_CARE_PROVIDER_SITE_OTHER): Payer: Self-pay

## 2023-01-10 ENCOUNTER — Ambulatory Visit: Payer: PPO | Admitting: Internal Medicine

## 2023-07-13 ENCOUNTER — Other Ambulatory Visit: Payer: Self-pay | Admitting: Diagnostic Neuroimaging

## 2023-07-16 NOTE — Telephone Encounter (Signed)
 Last seen on 05/30/22 See 11/29/22 patient message (pt has moved to [place] and has neuro provider there)

## 2023-08-12 ENCOUNTER — Other Ambulatory Visit: Payer: Self-pay | Admitting: Diagnostic Neuroimaging

## 2023-08-13 NOTE — Telephone Encounter (Signed)
 Last seen on 05/30/22 No follow up scheduled

## 2023-10-11 ENCOUNTER — Other Ambulatory Visit: Payer: Self-pay | Admitting: Cardiology

## 2023-10-11 DIAGNOSIS — I25118 Atherosclerotic heart disease of native coronary artery with other forms of angina pectoris: Secondary | ICD-10-CM

## 2023-10-11 DIAGNOSIS — I471 Supraventricular tachycardia, unspecified: Secondary | ICD-10-CM

## 2023-10-11 NOTE — Telephone Encounter (Signed)
 Rx refill sent to pharmacy.
